# Patient Record
Sex: Male | Born: 1955
Health system: Southern US, Community
[De-identification: ages and names within clinical notes are randomized; demographics above are authoritative.]

## PROBLEM LIST (undated history)

## (undated) DIAGNOSIS — F319 Bipolar disorder, unspecified: Secondary | ICD-10-CM

## (undated) DIAGNOSIS — I10 Essential (primary) hypertension: Secondary | ICD-10-CM

## (undated) HISTORY — DX: Essential (primary) hypertension: I10

## (undated) HISTORY — PX: OTHER SURGICAL HISTORY: SHX169

## (undated) HISTORY — DX: Bipolar disorder, unspecified: F31.9

---

## 2010-12-24 LAB — BASIC METABOLIC PANEL
BUN: 14 mg/dL (ref 4–21)
Creatinine: 0.7 mg/dL (ref 0.6–1.3)
GLUCOSE: 132 mg/dL
Potassium: 4.6 mmol/L (ref 3.4–5.3)
Sodium: 128 mmol/L — AB (ref 137–147)

## 2010-12-24 LAB — HEMOGLOBIN A1C: Hgb A1c MFr Bld: 5.8 % (ref 4.0–6.0)

## 2015-02-05 ENCOUNTER — Other Ambulatory Visit
Admission: RE | Admit: 2015-02-05 | Discharge: 2015-02-05 | Disposition: A | Discharge status: Home / Self Care | Payer: No Typology Code available for payment source | Source: Ambulatory Visit | Attending: Psychiatry | Admitting: Psychiatry

## 2015-02-05 DIAGNOSIS — F319 Bipolar disorder, unspecified: Secondary | ICD-10-CM | POA: Diagnosis present

## 2015-02-05 LAB — LIPID PANEL
CHOL/HDL RATIO: 2.6 ratio
Cholesterol: 207 mg/dL — ABNORMAL HIGH (ref 0–200)
HDL: 79 mg/dL (ref >40)
LDL Cholesterol: 123 mg/dL — ABNORMAL HIGH (ref 0–99)
TRIGLYCERIDES: 27 mg/dL (ref <150)
VLDL: 5 mg/dL (ref 0–40)

## 2015-02-05 LAB — GLUCOSE, RANDOM: Glucose, Bld: 123 mg/dL — ABNORMAL HIGH (ref 65–99)

## 2015-03-26 ENCOUNTER — Telehealth: Payer: Self-pay | Admitting: Psychiatry

## 2015-03-26 DIAGNOSIS — F3181 Bipolar II disorder: Secondary | ICD-10-CM

## 2015-04-27 ENCOUNTER — Other Ambulatory Visit
Admission: RE | Admit: 2015-04-27 | Discharge: 2015-04-27 | Disposition: A | Discharge status: Home / Self Care | Payer: No Typology Code available for payment source | Source: Ambulatory Visit | Attending: Psychiatry | Admitting: Psychiatry

## 2015-04-27 DIAGNOSIS — F319 Bipolar disorder, unspecified: Secondary | ICD-10-CM | POA: Diagnosis not present

## 2015-04-27 LAB — CBC WITH DIFFERENTIAL/PLATELET
Basophils Absolute: 0.1 10*3/uL (ref 0–0.1)
Basophils Relative: 1 %
EOS ABS: 0.1 10*3/uL (ref 0–0.7)
Eosinophils Relative: 2 %
HCT: 41.5 % (ref 40.0–52.0)
HEMOGLOBIN: 14.5 g/dL (ref 13.0–18.0)
Lymphocytes Relative: 20 %
Lymphs Abs: 1.1 10*3/uL (ref 1.0–3.6)
MCH: 34.4 pg — AB (ref 26.0–34.0)
MCHC: 35.1 g/dL (ref 32.0–36.0)
MCV: 97.9 fL (ref 80.0–100.0)
MONO ABS: 0.5 10*3/uL (ref 0.2–1.0)
MONOS PCT: 9 %
NEUTROS ABS: 3.6 10*3/uL (ref 1.4–6.5)
Neutrophils Relative %: 68 %
PLATELETS: 231 10*3/uL (ref 150–440)
RBC: 4.23 MIL/uL — ABNORMAL LOW (ref 4.40–5.90)
RDW: 14 % (ref 11.5–14.5)
WBC: 5.4 10*3/uL (ref 3.8–10.6)

## 2015-04-27 LAB — CARBAMAZEPINE LEVEL, TOTAL: Carbamazepine Lvl: 11.5 ug/mL (ref 4.0–12.0)

## 2015-04-27 LAB — LIPID PANEL
Cholesterol: 229 mg/dL — ABNORMAL HIGH (ref 0–200)
HDL: 78 mg/dL (ref >40)
LDL Cholesterol: 145 mg/dL — ABNORMAL HIGH (ref 0–99)
Total CHOL/HDL Ratio: 2.9 RATIO
Triglycerides: 30 mg/dL (ref <150)
VLDL: 6 mg/dL (ref 0–40)

## 2015-04-27 LAB — GLUCOSE, RANDOM: Glucose, Bld: 111 mg/dL — ABNORMAL HIGH (ref 65–99)

## 2015-05-30 ENCOUNTER — Ambulatory Visit: Payer: Self-pay | Admitting: Psychiatry

## 2015-05-31 ENCOUNTER — Encounter: Payer: Self-pay | Admitting: Psychiatry

## 2015-05-31 ENCOUNTER — Ambulatory Visit (INDEPENDENT_AMBULATORY_CARE_PROVIDER_SITE_OTHER): Payer: No Typology Code available for payment source | Admitting: Psychiatry

## 2015-05-31 ENCOUNTER — Ambulatory Visit: Payer: Self-pay | Admitting: Psychiatry

## 2015-05-31 VITALS — BP 128/78 | HR 78 | Temp 97.4°F | Ht 67.0 in | Wt 165.8 lb

## 2015-05-31 DIAGNOSIS — F3170 Bipolar disorder, currently in remission, most recent episode unspecified: Secondary | ICD-10-CM

## 2015-05-31 MED ORDER — CARBAMAZEPINE 200 MG PO TABS: ORAL_TABLET | ORAL | Status: DC

## 2015-05-31 NOTE — Progress Notes (Signed)
BH MD/PA/NP OP Progress Note  05/31/2015 2:03 PM Eric Hatfield  MRN:  [id number]  Subjective:  Patient returns for follow-up of his bipolar disorder type II. He indicates things continue to be the same. He continues to work 4 days a week as they have Fridays off in [place]. He states that he comes home he is sleeping well and states that sometimes he'll fall asleep in his chair while he is reading the newspaper. He states his appetite is good. He denies any psychotic symptoms. He denies any mood changes, manic symptoms or depressive symptoms. He presents to the appointment with his wife as is usually the case and she does not report any issues.  I reviewed his labs with him. He did have a mildly elevated glucose at 111 and his cholesterol was slightly elevated to 27. However I encouraged him to discuss these with his primary care physician. His Tegretol level was 11.5. He denies any side effects or problems from this medication and has been stable on it for years and thus we are not going to change the dose today.  Chief Complaint: same Chief Complaint    Follow-up; Medication Refill; Stress     Visit Diagnosis:     ICD-9-CM ICD-10-CM   1. Bipolar disorder in full remission, most recent episode unspecified type 296.80 F31.70     Past Medical History:  Past Medical History  Diagnosis Date  . Anxiety   . Depression    History reviewed. No pertinent past surgical history. Family History:  Family History  Problem Relation Age of Onset  . Hypertension Mother   . Hyperlipidemia Father   . Hypertension Brother    Social History:  Social History   Social History  . Marital Status: Unknown    Spouse Name: N/A  . Number of Children: N/A  . Years of Education: N/A   Social History Main Topics  . Smoking status: Current Every Day Smoker    Types: Cigarettes    Start date: 05/31/1975  . Smokeless tobacco: Never Used  . Alcohol Use: No  . Drug Use: No  . Sexual Activity: Yes    Birth Control/ Protection: None   Other Topics Concern  . None   Social History Narrative  . None   Additional History:   Assessment:   Musculoskeletal: Strength & Muscle Tone: within normal limits Gait & Station: normal Patient leans: N/A  Psychiatric Specialty Exam: HPI  Review of Systems  Psychiatric/Behavioral: Negative for depression, suicidal ideas, hallucinations, memory loss and substance abuse. The patient is not nervous/anxious and does not have insomnia.     Blood pressure 128/78, pulse 78, temperature 97.4 F (36.3 C), temperature source Tympanic, height 5\' 7"  (1.702 m), weight 165 lb 12.8 oz (75.206 kg), SpO2 95 %.Body mass index is 25.96 kg/(m^2).  General Appearance: Well Groomed  Eye Contact:  Good  Speech:  Normal Rate  Volume:  Normal  Mood:  Good  Affect:  bright, euthymic  Thought Process:  Linear  Orientation:  Full (Time, Place, and Person)  Thought Content:  Negative  Suicidal Thoughts:  No  Homicidal Thoughts:  No  Memory:  Immediate;   Good Recent;   Good Remote;   Good  Judgement:  Good  Insight:  Good  Psychomotor Activity:  Negative  Concentration:  Good  Recall:  Good  Fund of Knowledge: Good  Language: Good  Akathisia:  Negative  Handed:  Right  AIMS (if indicated):    Assets:  Communication  Skills Desire for Improvement Social Support Vocational/Educational  ADL's:  Intact  Cognition: WNL  Sleep: good   Is the patient at risk to self?  No. Has the patient been a risk to self in the past 6 months?  No. Has the patient been a risk to self within the distant past?  No. Is the patient a risk to others?  No. Has the patient been a risk to others in the past 6 months?  No. Has the patient been a risk to others within the distant past?  No.  Current Medications: Current Outpatient Prescriptions  Medication Sig Dispense Refill  . carbamazepine (TEGRETOL) 200 MG tablet Take 2 tablets in the morning, 1 at noon and 3 at bedtime.  180 tablet 5   No current facility-administered medications for this visit.    Medical Decision Making:  Established Problem, Stable/Improving (1) and Review of Medication Regimen & Side Effects (2)  Treatment Plan Summary:Medication management and Plan We will continue the patient on his carbamazepine, 400 mg in the morning, 200 mg at noon and 600 mg at bedtime. He will follow-up in 6 months. At that point we will obtain additional laboratory studies and assess as to whether it is appropriate to decrease his dose. We have recently discontinued the Navane which she had been on. Both he and wife report no change since the discontinuation of the Navane.   Wallace Going 05/31/2015, 2:03 PM

## 2015-09-02 ENCOUNTER — Telehealth: Payer: Self-pay | Admitting: Family Medicine

## 2015-09-02 DIAGNOSIS — E739 Lactose intolerance, unspecified: Secondary | ICD-10-CM | POA: Insufficient documentation

## 2015-09-02 DIAGNOSIS — F17200 Nicotine dependence, unspecified, uncomplicated: Secondary | ICD-10-CM

## 2015-09-02 DIAGNOSIS — F319 Bipolar disorder, unspecified: Secondary | ICD-10-CM

## 2015-09-02 DIAGNOSIS — J309 Allergic rhinitis, unspecified: Secondary | ICD-10-CM | POA: Insufficient documentation

## 2015-09-02 DIAGNOSIS — F17210 Nicotine dependence, cigarettes, uncomplicated: Secondary | ICD-10-CM | POA: Insufficient documentation

## 2015-09-02 DIAGNOSIS — E871 Hypo-osmolality and hyponatremia: Secondary | ICD-10-CM | POA: Insufficient documentation

## 2015-09-02 NOTE — Telephone Encounter (Signed)
Pt has not been seen since 04/24/2013.  Pt states has is having sinus congestion and some cough.  Pt states he has not seen any other doctor.  Pt is requesting to reestablish with you.  [employer].  Rx-Carbamazbine 200mg .  CB#[phone]/MW

## 2015-09-02 NOTE — Telephone Encounter (Signed)
Marcelino DusterMichelle, can you schedule appt? Thanks!

## 2015-09-02 NOTE — Telephone Encounter (Signed)
Patients seen within the last 3 years are still active patients and do not need to be re-established. This past should be scheduled for o.v. Without delay. Thanks.

## 2015-09-02 NOTE — Telephone Encounter (Signed)
Please advise 

## 2015-09-02 NOTE — Telephone Encounter (Signed)
Pt scheduled for 09/03/2015@11 :15/MW

## 2015-09-03 ENCOUNTER — Encounter: Payer: Self-pay | Admitting: Family Medicine

## 2015-09-03 ENCOUNTER — Ambulatory Visit (INDEPENDENT_AMBULATORY_CARE_PROVIDER_SITE_OTHER): Payer: No Typology Code available for payment source | Admitting: Family Medicine

## 2015-09-03 VITALS — BP 124/70 | HR 76 | Temp 98.7°F | Resp 16 | Wt 167.0 lb

## 2015-09-03 DIAGNOSIS — J329 Chronic sinusitis, unspecified: Secondary | ICD-10-CM | POA: Insufficient documentation

## 2015-09-03 DIAGNOSIS — J322 Chronic ethmoidal sinusitis: Secondary | ICD-10-CM

## 2015-09-03 DIAGNOSIS — J309 Allergic rhinitis, unspecified: Secondary | ICD-10-CM | POA: Diagnosis not present

## 2015-09-03 MED ORDER — AMOXICILLIN 500 MG PO CAPS: 1000.0000 mg | ORAL_CAPSULE | Freq: Two times a day (BID) | ORAL | Status: AC

## 2015-09-03 MED ORDER — FLUTICASONE PROPIONATE 50 MCG/ACT NA SUSP: 2.0000 | Freq: Every day | NASAL | Status: DC

## 2015-09-03 NOTE — Progress Notes (Signed)
       Patient: Eric Hatfield Male    DOB: 05-04-1956   59 y.o.   MRN: [id number] Visit Date: 09/03/2015  Today's Provider: Mila Merryonald Mauricio Dahlen, MD   Chief Complaint  Patient presents with  . Cough  . Sinusitis   Subjective:         Sinusitis This is a new problem. The current episode started 1 to 4 weeks ago (x19 days). The problem is unchanged. There has been no fever. He is experiencing no pain. Associated symptoms include congestion, coughing and sinus pressure. Pertinent negatives include no chills, ear pain, headaches, hoarse voice, neck pain, shortness of breath, sneezing or sore throat. Past treatments include spray decongestants, oral decongestants and saline sprays. The treatment provided mild relief.   Patient has had sinus congestion for the last 19 days. Has had a mild cough, non-productive. No fever or sore throat. Has been using OTC saline and pseudoephredrine with minimal improvement.    No Known Allergies Previous Medications   CARBAMAZEPINE (TEGRETOL) 200 MG TABLET    Take 2 tablets in the morning, 1 at noon and 3 at bedtime.    Review of Systems  Constitutional: Negative for fever, chills and appetite change.  HENT: Positive for congestion, postnasal drip and sinus pressure. Negative for ear pain, hoarse voice, sneezing and sore throat.   Respiratory: Positive for cough. Negative for chest tightness, shortness of breath and wheezing.   Cardiovascular: Negative for chest pain and palpitations.  Gastrointestinal: Negative for nausea, vomiting and abdominal pain.  Musculoskeletal: Negative for neck pain.  Neurological: Negative for headaches.    Social History  Substance Use Topics  . Smoking status: Current Every Day Smoker -- 1.00 packs/day for 30 years    Types: Cigarettes    Start date: 05/31/1975  . Smokeless tobacco: Never Used  . Alcohol Use: No   Objective:   BP 124/70 mmHg  Pulse 76  Temp(Src) 98.7 F (37.1 C) (Oral)  Resp 16  Wt 167 lb  (75.751 kg)  SpO2 97%  Physical Exam  General Appearance:    Alert, cooperative, no distress  HENT:   ENT exam normal, no neck nodes or sinus tenderness, neck without nodes, throat normal without erythema or exudate, ethmoid sinus tender and nasal mucosa pale and congested  Eyes:    PERRL, conjunctiva/corneas clear, EOM's intact       Lungs:     Clear to auscultation bilaterally, respirations unlabored  Heart:    Regular rate and rhythm  Neurologic:   Awake, alert, oriented x 3. No apparent focal neurological           defect.           Assessment & Plan:     1. Ethmoid sinusitis, unspecified chronicity  - amoxicillin (AMOXIL) 500 MG capsule; Take 2 capsules (1,000 mg total) by mouth 2 (two) times daily.  Dispense: 28 capsule; Refill: 0  2. Allergic rhinitis, unspecified allergic rhinitis type  - fluticasone (FLONASE) 50 MCG/ACT nasal spray; Place 2 sprays into both nostrils daily.  Dispense: 16 g; Refill: 6  Patient Instructions  Use prescription nasal spray (fluticasone) before bed at night Continue to use saline nasal spray every 3-4 hours during the daytime.   Call if symptoms change or if not rapidly improving.            Mila Merryonald Gerardine Peltz, MD  [facility]

## 2015-09-03 NOTE — Patient Instructions (Signed)
Use prescription nasal spray (fluticasone) before bed at night Continue to use saline nasal spray every 3-4 hours during the daytime.

## 2015-09-04 ENCOUNTER — Encounter: Payer: Self-pay | Admitting: Family Medicine

## 2015-11-28 ENCOUNTER — Ambulatory Visit (INDEPENDENT_AMBULATORY_CARE_PROVIDER_SITE_OTHER): Payer: No Typology Code available for payment source | Admitting: Psychiatry

## 2015-11-28 ENCOUNTER — Encounter: Payer: Self-pay | Admitting: Psychiatry

## 2015-11-28 DIAGNOSIS — F319 Bipolar disorder, unspecified: Secondary | ICD-10-CM

## 2015-11-28 MED ORDER — CARBAMAZEPINE 200 MG PO TABS: ORAL_TABLET | ORAL | Status: DC

## 2015-11-28 NOTE — Progress Notes (Signed)
Patient ID: Eric Hatfield A Sinopoli, male   DOB: 1956/06/14, 60 y.o.   MRN: [id number] [facility]  11/28/2015 3:04 PM Eric BloodgoodRay A Uresti  MRN:  [id number]  Subjective:  Patient returns for follow-up of his bipolar disorder type II. Patient was previously seen by Dr. Mayford KnifeWilliams. This is the first visit for this patient with this physician. He reports doing well. Denies any problems with his mood. Fair sleep and appetite. His been taking his Tegretol as instructed. He was discontinued on the Navane at last visit and states he's done well.   He denies any psychotic symptoms. He denies any mood changes, manic symptoms or depressive symptoms. He presents to the appointment with his wife as is usually the case and she does not report any issues.    Chief Complaint    Follow-up; Medication Refill     Visit Diagnosis:   No diagnosis found.  Past Medical History:  History reviewed. No pertinent past medical history.  Past Surgical History  Procedure Laterality Date  . Impacted tooth     Family History:  Family History  Problem Relation Age of Onset  . Hypertension Mother   . Hyperlipidemia Father   . Hypertension Brother    Social History:  Social History   Social History  . Marital Status: Married    Spouse Name: N/A  . Number of Children: N/A  . Years of Education: N/A   Social History Main Topics  . Smoking status: Current Every Day Smoker -- 1.00 packs/day for 30 years    Types: Cigarettes    Start date: 05/31/1975  . Smokeless tobacco: Never Used  . Alcohol Use: No  . Drug Use: No  . Sexual Activity: Yes    Birth Control/ Protection: None   Other Topics Concern  . None   Social History Narrative   Additional History:   Assessment:   Musculoskeletal: Strength & Muscle Tone: within normal limits Gait & Station: normal Patient leans: N/A  Psychiatric Specialty Exam: HPI  Review of Systems  Psychiatric/Behavioral: Negative for depression, suicidal ideas,  hallucinations, memory loss and substance abuse. The patient is not nervous/anxious and does not have insomnia.     Blood pressure 142/88, pulse 82, temperature 97.7 F (36.5 C), temperature source Tympanic, height 5\' 7"  (1.702 m), weight 163 lb 12.8 oz (74.299 kg), SpO2 95 %.Body mass index is 25.65 kg/(m^2).  General Appearance: Well Groomed  Eye Contact:  Good  Speech:  Normal Rate  Volume:  Normal  Mood:  Good  Affect:  bright, euthymic  Thought Process:  Linear  Orientation:  Full (Time, Place, and Person)  Thought Content:  Negative  Suicidal Thoughts:  No  Homicidal Thoughts:  No  Memory:  Immediate;   Good Recent;   Good Remote;   Good  Judgement:  Good  Insight:  Good  Psychomotor Activity:  Negative  Concentration:  Good  Recall:  Good  Fund of Knowledge: Good  Language: Good  Akathisia:  Negative  Handed:  Right  AIMS (if indicated):    Assets:  Communication Skills Desire for Improvement Social Support Vocational/Educational  ADL's:  Intact  Cognition: WNL  Sleep: good   Is the patient at risk to self?  No. Has the patient been a risk to self in the past 6 months?  No. Has the patient been a risk to self within the distant past?  No. Is the patient a risk to others?  No. Has the patient been a  risk to others in the past 6 months?  No. Has the patient been a risk to others within the distant past?  No.  Current Medications: Current Outpatient Prescriptions  Medication Sig Dispense Refill  . carbamazepine (TEGRETOL) 200 MG tablet Take 2 tablets in the morning, 1 at noon and 3 at bedtime. 180 tablet 5  . fluticasone (FLONASE) 50 MCG/ACT nasal spray Place 2 sprays into both nostrils daily. 16 g 6   No current facility-administered medications for this visit.    Medical Decision Making:  Established Problem, Stable/Improving (1) and Review of Medication Regimen & Side Effects (2)  Treatment Plan Summary:Medication management and Plan We will continue the  patient on his carbamazepine, 400 mg in the morning, 200 mg at noon and 600 mg at bedtime.  Obtain Tegretol level  And Liver function tests prior to next visit. RTC in 3 months or call before if necessary.  Teegan Brandis 11/28/2015, 3:04 PM

## 2015-11-29 ENCOUNTER — Ambulatory Visit: Payer: No Typology Code available for payment source | Admitting: Psychiatry

## 2016-02-15 ENCOUNTER — Other Ambulatory Visit
Admission: RE | Admit: 2016-02-15 | Discharge: 2016-02-15 | Disposition: A | Discharge status: Home / Self Care | Payer: No Typology Code available for payment source | Source: Ambulatory Visit | Attending: Psychiatry | Admitting: Psychiatry

## 2016-02-15 DIAGNOSIS — R945 Abnormal results of liver function studies: Secondary | ICD-10-CM | POA: Diagnosis present

## 2016-02-15 LAB — HEPATIC FUNCTION PANEL
ALBUMIN: 4.4 g/dL (ref 3.5–5.0)
ALT: 27 U/L (ref 17–63)
AST: 26 U/L (ref 15–41)
Alkaline Phosphatase: 106 U/L (ref 38–126)
BILIRUBIN DIRECT: 0.1 mg/dL (ref 0.1–0.5)
Indirect Bilirubin: 0.7 mg/dL (ref 0.3–0.9)
Total Bilirubin: 0.8 mg/dL (ref 0.3–1.2)
Total Protein: 7.1 g/dL (ref 6.5–8.1)

## 2016-02-15 LAB — CARBAMAZEPINE LEVEL, TOTAL: Carbamazepine Lvl: 8.8 ug/mL (ref 4.0–12.0)

## 2016-02-27 ENCOUNTER — Ambulatory Visit (INDEPENDENT_AMBULATORY_CARE_PROVIDER_SITE_OTHER): Payer: Managed Care, Other (non HMO) | Admitting: Psychiatry

## 2016-02-27 ENCOUNTER — Encounter: Payer: Self-pay | Admitting: Psychiatry

## 2016-02-27 VITALS — BP 148/76 | HR 72 | Temp 97.5°F | Ht 69.0 in | Wt 163.2 lb

## 2016-02-27 DIAGNOSIS — F3170 Bipolar disorder, currently in remission, most recent episode unspecified: Secondary | ICD-10-CM | POA: Diagnosis not present

## 2016-02-27 MED ORDER — CARBAMAZEPINE 200 MG PO TABS: ORAL_TABLET | ORAL | Status: DC

## 2016-02-27 NOTE — Progress Notes (Signed)
Patient ID: Eric Hatfield, male   DOB: 1956/08/02, 60 y.o.   MRN: [id number]  [facility] MD/PA/NP OP Progress Note  02/27/2016 3:05 PM Eric Hatfield  MRN:  [id number]  Subjective:  Patient returns for follow-up of his bipolar disorder type II.Marland Kitchen He reports doing well. Denies any problems with his mood. Fair sleep and appetite. His been taking his Tegretol as instructed.   He denies any psychotic symptoms. He denies any mood changes, manic symptoms or depressive symptoms. Patient reports enjoying time with his family and his life. States that he works most days and enjoys his work. He denies any suicidal thoughts. Patients liver function tests are normal, Carbamazepine level at 8.8  Visit Diagnosis:     ICD-9-CM ICD-10-CM   1. Bipolar disorder in full remission, most recent episode unspecified type (HCC) 296.80 F31.70     Past Medical History:  No past medical history on file.  Past Surgical History  Procedure Laterality Date  . Impacted tooth     Family History:  Family History  Problem Relation Age of Onset  . Hypertension Mother   . Hyperlipidemia Father   . Hypertension Brother    Social History:  Social History   Social History  . Marital Status: Married    Spouse Name: N/A  . Number of Children: N/A  . Years of Education: N/A   Social History Main Topics  . Smoking status: Current Every Day Smoker -- 1.00 packs/day for 30 years    Types: Cigarettes    Start date: 05/31/1975  . Smokeless tobacco: Never Used  . Alcohol Use: No  . Drug Use: No  . Sexual Activity: Yes    Birth Control/ Protection: None   Other Topics Concern  . None   Social History Narrative   Additional History:   Assessment:   Musculoskeletal: Strength & Muscle Tone: within normal limits Gait & Station: normal Patient leans: N/A  Psychiatric Specialty Exam: HPI  Review of Systems  Psychiatric/Behavioral: Negative for depression, suicidal ideas, hallucinations, memory loss and substance  abuse. The patient is not nervous/anxious and does not have insomnia.     Blood pressure 148/76, pulse 72, temperature 97.5 F (36.4 C), temperature source Tympanic, height  (1.753 m), weight 163 lb 3.2 oz (74.027 kg), SpO2 94 %.Body mass index is 24.09 kg/(m^2).  General Appearance: Well Groomed  Eye Contact:  Good  Speech:  Normal Rate  Volume:  Normal  Mood:  Good  Affect:  bright, euthymic  Thought Process:  Linear  Orientation:  Full (Time, Place, and Person)  Thought Content:  Negative  Suicidal Thoughts:  No  Homicidal Thoughts:  No  Memory:  Immediate;   Good Recent;   Good Remote;   Good  Judgement:  Good  Insight:  Good  Psychomotor Activity:  Negative  Concentration:  Good  Recall:  Good  Fund of Knowledge: Good  Language: Good  Akathisia:  Negative  Handed:  Right  AIMS (if indicated):    Assets:  Communication Skills Desire for Improvement Social Support Vocational/Educational  ADL's:  Intact  Cognition: WNL  Sleep: good   Is the patient at risk to self?  No. Has the patient been a risk to self in the past 6 months?  No. Has the patient been a risk to self within the distant past?  No. Is the patient a risk to others?  No. Has the patient been a risk to others in the past 6 months?  No. Has  the patient been a risk to others within the distant past?  No.  Current Medications: Current Outpatient Prescriptions  Medication Sig Dispense Refill  . carbamazepine (TEGRETOL) 200 MG tablet Take 2 tablets in the morning, 1 at noon and 3 at bedtime. 180 tablet 5  . fluticasone (FLONASE) 50 MCG/ACT nasal spray Place 2 sprays into both nostrils daily. 16 g 6   No current facility-administered medications for this visit.    Medical Decision Making:  Established Problem, Stable/Improving (1) and Review of Medication Regimen & Side Effects (2)  Treatment Plan Summary:Medication management and Plan We will continue the patient on his carbamazepine, 400 mg in the  morning, 200 mg at noon and 600 mg at bedtime.  Labs reviewed - LFT`s normal, tegretol level at 8.8 RTC in 3 months or call before if necessary.  [name] 02/27/2016, 3:05 PM

## 2016-06-25 ENCOUNTER — Ambulatory Visit (INDEPENDENT_AMBULATORY_CARE_PROVIDER_SITE_OTHER): Payer: Managed Care, Other (non HMO) | Admitting: Psychiatry

## 2016-06-25 ENCOUNTER — Encounter: Payer: Self-pay | Admitting: Psychiatry

## 2016-06-25 VITALS — BP 173/91 | HR 80 | Temp 97.9°F | Wt 164.4 lb

## 2016-06-25 DIAGNOSIS — F3170 Bipolar disorder, currently in remission, most recent episode unspecified: Secondary | ICD-10-CM

## 2016-06-25 MED ORDER — CARBAMAZEPINE 200 MG PO TABS: ORAL_TABLET | ORAL | 3 refills | Status: DC

## 2016-06-25 NOTE — Progress Notes (Signed)
Patient ID: Eric Hatfield A Berdan, male   DOB: 21-Sep-1956, 60 y.o.   MRN: [id number]  [facility]  06/25/2016 2:50 PM Eric BloodgoodRay A Derrick  MRN:  [id number]  Subjective:  Patient returns for follow-up of his bipolar disorder type II.Marland Kitchen. He reports doing well. Denies any problems with his mood. Fair sleep and appetite. His been taking his Tegretol as instructed.   He denies any psychotic symptoms. He denies any mood changes, manic symptoms or depressive symptoms. Patient reports enjoying time with his family and his life. States that he works most days and enjoys his work. He denies any suicidal thoughts.  Chief Complaint    Follow-up; Medication Refill     Visit Diagnosis:     ICD-9-CM ICD-10-CM   1. Bipolar disorder in full remission, most recent episode unspecified type (HCC) 296.80 F31.70     Past Medical History:  History reviewed. No pertinent past medical history.  Past Surgical History:  Procedure Laterality Date  . Impacted tooth     Family History:  Family History  Problem Relation Age of Onset  . Hypertension Mother   . Hyperlipidemia Father   . Hypertension Brother    Social History:  Social History   Social History  . Marital status: Married    Spouse name: N/A  . Number of children: N/A  . Years of education: N/A   Social History Main Topics  . Smoking status: Current Every Day Smoker    Packs/day: 1.00    Years: 30.00    Types: Cigarettes    Start date: 05/31/1975  . Smokeless tobacco: Never Used  . Alcohol use No  . Drug use: No  . Sexual activity: Yes    Birth control/ protection: None   Other Topics Concern  . None   Social History Narrative  . None   Additional History:   Assessment:   Musculoskeletal: Strength & Muscle Tone: within normal limits Gait & Station: normal Patient leans: N/A  Psychiatric Specialty Exam: HPI  Review of Systems  Psychiatric/Behavioral: Negative for depression, hallucinations, memory loss, substance  abuse and suicidal ideas. The patient is not nervous/anxious and does not have insomnia.     Blood pressure (!) 173/91, pulse 80, temperature 97.9 F (36.6 C), temperature source Oral, weight 164 lb 6.4 oz (74.6 kg).Body mass index is 24.28 kg/m.  General Appearance: Well Groomed  Eye Contact:  Good  Speech:  Normal Rate  Volume:  Normal  Mood:  Good  Affect:  bright, euthymic  Thought Process:  Linear  Orientation:  Full (Time, Place, and Person)  Thought Content:  Negative  Suicidal Thoughts:  No  Homicidal Thoughts:  No  Memory:  Immediate;   Good Recent;   Good Remote;   Good  Judgement:  Good  Insight:  Good  Psychomotor Activity:  Negative  Concentration:  Good  Recall:  Good  Fund of Knowledge: Good  Language: Good  Akathisia:  Negative  Handed:  Right  AIMS (if indicated):      Assets:  Communication Skills Desire for Improvement Social Support Vocational/Educational  ADL's:  Intact  Cognition: WNL  Sleep: good   Is the patient at risk to self?  No. Has the patient been a risk to self in the past 6 months?  No. Has the patient been a risk to self within the distant past?  No. Is the patient a risk to others?  No. Has the patient been a risk to others in the past  6 months?  No. Has the patient been a risk to others within the distant past?  No.  Current Medications: Current Outpatient Prescriptions  Medication Sig Dispense Refill  . carbamazepine (TEGRETOL) 200 MG tablet Take 2 tablets in the morning, 1 at noon and 3 at bedtime. 180 tablet 3  . fluticasone (FLONASE) 50 MCG/ACT nasal spray Place 2 sprays into both nostrils daily. (Patient not taking: Reported on 06/25/2016) 16 g 6   No current facility-administered medications for this visit.     Medical Decision Making:  Established Problem, Stable/Improving (1) and Review of Medication Regimen & Side Effects (2)  Treatment Plan Summary:Medication management and Plan We will continue the patient on his  carbamazepine, 400 mg in the morning, 200 mg at noon and 600 mg at bedtime.   RTC in 3 months or call before if necessary.  Fancy Dunkley 06/25/2016, 2:50 PM

## 2016-11-26 ENCOUNTER — Encounter: Payer: Self-pay | Admitting: Psychiatry

## 2016-11-26 ENCOUNTER — Ambulatory Visit (INDEPENDENT_AMBULATORY_CARE_PROVIDER_SITE_OTHER): Payer: Managed Care, Other (non HMO) | Admitting: Psychiatry

## 2016-11-26 VITALS — BP 159/96 | HR 76 | Temp 97.6°F | Wt 166.0 lb

## 2016-11-26 DIAGNOSIS — F3170 Bipolar disorder, currently in remission, most recent episode unspecified: Secondary | ICD-10-CM

## 2016-11-26 MED ORDER — CARBAMAZEPINE 200 MG PO TABS: ORAL_TABLET | ORAL | 3 refills | Status: DC

## 2016-11-26 NOTE — Progress Notes (Signed)
Patient ID: Eric Hatfield, male   DOB: Feb 11, 1956, 61 y.o.   MRN: [id number]  [facility] OP Progress Note  11/26/2016 3:06 PM Eric Hatfield  MRN:  [id number]  Subjective:  Patient returns for follow-up of his bipolar disorder type II.Marland Kitchen. He reports that he has been doing quite well. States that he has been working outside and doing well overall. Compliant with his Tegretol like he should be taking it. Blood pressure is elevated today at 169/95. States that he has a little bit of coffee and was running around a bit. His mother and brother have history of hypertension. Sleeping well and eating well. Denies any other issues with his mood.  Chief Complaint    Follow-up; Medication Refill     Visit Diagnosis:     ICD-9-CM ICD-10-CM   1. Bipolar disorder in full remission, most recent episode unspecified type (HCC) 296.80 F31.70     Past Medical History:  History reviewed. No pertinent past medical history.  Past Surgical History:  Procedure Laterality Date  . Impacted tooth     Family History:  Family History  Problem Relation Age of Onset  . Hypertension Mother   . Hyperlipidemia Father   . Hypertension Brother    Social History:  Social History   Social History  . Marital status: Married    Spouse name: N/A  . Number of children: N/A  . Years of education: N/A   Social History Main Topics  . Smoking status: Current Every Day Smoker    Packs/day: 1.00    Years: 30.00    Types: Cigarettes    Start date: 05/31/1975  . Smokeless tobacco: Never Used  . Alcohol use No  . Drug use: No  . Sexual activity: Yes    Birth control/ protection: None   Other Topics Concern  . None   Social History Narrative  . None   Additional History:   Assessment:   Musculoskeletal: Strength & Muscle Tone: within normal limits Gait & Station: normal Patient leans: N/A  Psychiatric Specialty Exam: HPI  Review of Systems  Psychiatric/Behavioral: Negative for depression,  hallucinations, memory loss, substance abuse and suicidal ideas. The patient is not nervous/anxious and does not have insomnia.     Blood pressure (!) 169/95, pulse 76, temperature 97.6 F (36.4 C), temperature source Oral, weight 166 lb (75.3 kg).Body mass index is 24.51 kg/m.  General Appearance: Well Groomed  Eye Contact:  Good  Speech:  Normal Rate  Volume:  Normal  Mood:  Good  Affect:  bright, euthymic  Thought Process:  Linear  Orientation:  Full (Time, Place, and Person)  Thought Content:  Negative  Suicidal Thoughts:  No  Homicidal Thoughts:  No  Memory:  Immediate;   Good Recent;   Good Remote;   Good  Judgement:  Good  Insight:  Good  Psychomotor Activity:  Negative  Concentration:  Good  Recall:  Good  Fund of Knowledge: Good  Language: Good  Akathisia:  Negative  Handed:  Right  AIMS (if indicated):      Assets:  Communication Skills Desire for Improvement Social Support Vocational/Educational  ADL's:  Intact  Cognition: WNL  Sleep: good   Is the patient at risk to self?  No. Has the patient been a risk to self in the past 6 months?  No. Has the patient been a risk to self within the distant past?  No. Is the patient a risk to others?  No. Has the patient been  a risk to others in the past 6 months?  No. Has the patient been a risk to others within the distant past?  No.  Current Medications: Current Outpatient Prescriptions  Medication Sig Dispense Refill  . carbamazepine (TEGRETOL) 200 MG tablet Take 2 tablets in the morning, 1 at noon and 3 at bedtime. 180 tablet 3  . fluticasone (FLONASE) 50 MCG/ACT nasal spray Place 2 sprays into both nostrils daily. 16 g 6   No current facility-administered medications for this visit.     Medical Decision Making:  Established Problem, Stable/Improving (1) and Review of Medication Regimen & Side Effects (2)  Treatment Plan Summary:Medication management and Plan We will continue the patient on his  carbamazepine, 400 mg in the morning, 200 mg at noon and 600 mg at bedtime.  Obtain Tegretol level prior to next visit.  Hypertension-patient recommended to see his primary care physician and manage his high blood pressure since he has a family history.  RTC in 3 months or call before if necessary.  Nayelly Laughman 11/26/2016, 3:06 PM

## 2016-12-11 ENCOUNTER — Encounter: Payer: Self-pay | Admitting: Family Medicine

## 2016-12-11 ENCOUNTER — Ambulatory Visit (INDEPENDENT_AMBULATORY_CARE_PROVIDER_SITE_OTHER): Payer: Managed Care, Other (non HMO) | Admitting: Family Medicine

## 2016-12-11 VITALS — BP 154/90 | HR 91 | Temp 97.9°F | Resp 16 | Wt 163.0 lb

## 2016-12-11 DIAGNOSIS — I10 Essential (primary) hypertension: Secondary | ICD-10-CM | POA: Insufficient documentation

## 2016-12-11 DIAGNOSIS — F17210 Nicotine dependence, cigarettes, uncomplicated: Secondary | ICD-10-CM | POA: Diagnosis not present

## 2016-12-11 DIAGNOSIS — R030 Elevated blood-pressure reading, without diagnosis of hypertension: Secondary | ICD-10-CM

## 2016-12-11 NOTE — Progress Notes (Signed)
Patient: Eric Hatfield Male    DOB: 09-09-1956   61 y.o.   MRN: [id number] Visit Date: 12/11/2016  Today's Provider: Mila Merry, MD   Chief Complaint  Patient presents with  . Blood Pressure Check   Subjective:    HPI  Elevated blood pressure:  BP Readings from Last 3 Encounters:  09/03/15 124/70  04/24/13 132/80    Patient comes in today reporting that his blood pressure has been elevated lately. His blood pressure was taken while he was seeing his Psychiatrist and it was elevated at 169/95. Patient was advised to follow up with his PCP.  He is not exercising. He is not adherent to low salt diet.   He is experiencing none.  Patient denies chest pain, chest pressure/discomfort, claudication, dyspnea, exertional chest pressure/discomfort, fatigue, irregular heart beat, lower extremity edema, near-syncope, orthopnea, palpitations, paroxysmal nocturnal dyspnea, syncope and tachypnea.   Cardiovascular risk factors include none.  Use of agents associated with hypertension: 4-5 cups of caffeinated coffee and several glasses of unsweet tea daily.     Weight trend: stable Wt Readings from Last 3 Encounters:  09/03/15 167 lb (75.8 kg)  04/24/13 166 lb (75.3 kg)    Current diet: well balanced  ------------------------------------------------------------------------     No Known Allergies   Current Outpatient Prescriptions:  .  carbamazepine (TEGRETOL) 200 MG tablet, Take 2 tablets in the morning, 1 at noon and 3 at bedtime., Disp: 180 tablet, Rfl: 3 .  fluticasone (FLONASE) 50 MCG/ACT nasal spray, Place 2 sprays into both nostrils daily. (Patient not taking: Reported on 12/11/2016), Disp: 16 g, Rfl: 6  Review of Systems  Constitutional: Negative for appetite change, chills and fever.  Respiratory: Negative for chest tightness, shortness of breath and wheezing.   Cardiovascular: Negative for chest pain and palpitations.  Gastrointestinal: Negative for abdominal  pain, nausea and vomiting.    Social History  Substance Use Topics  . Smoking status: Current Every Day Smoker    Packs/day: 1.00    Years: 30.00    Types: Cigarettes    Start date: 05/31/1975  . Smokeless tobacco: Never Used  . Alcohol use No   Objective:   BP (!) 156/90 (BP Location: Right Arm, Patient Position: Sitting, Cuff Size: Normal)   Pulse 91   Temp 97.9 F (36.6 C) (Oral)   Resp 16   Wt 163 lb (73.9 kg)   SpO2 97% Comment: room air  BMI 24.07 kg/m  Vitals:   12/11/16 0808 12/11/16 0823  BP: (!) 156/90 (!) 154/90  Pulse: 91   Resp: 16   Temp: 97.9 F (36.6 C)   TempSrc: Oral   SpO2: 97%   Weight: 163 lb (73.9 kg)      Physical Exam   General Appearance:    Alert, cooperative, no distress  Eyes:    PERRL, conjunctiva/corneas clear, EOM's intact       Lungs:     Clear to auscultation bilaterally, respirations unlabored  Heart:    Regular rate and rhythm  Neurologic:   Awake, alert, oriented x 3. No apparent focal neurological           defect.           Assessment & Plan:     1. Blood pressure elevated without history of HTN He admits to drinking excessive amounts of caffeinated beverages daily. His is to limit this to 2 cups of coffee and 1 cup of unsweetened tea. Strongly encouraged  to work on quitting smoking, he is going to start by reducing to 1/2 ppd.  - Lipid panel - TSH - Renal function panel  If labs normal will return in a month for BP check. Start medication if not better at follow up.   2. Smoking greater than 30 pack years Work on stopping smoking.      The entirety of the information documented in the History of Present Illness, Review of Systems and Physical Exam were personally obtained by me. Portions of this information were initially documented by Awilda Billoshena Chambers, CMA and reviewed by me for thoroughness and accuracy.    Mila Merryonald Fisher, MD  [facility]

## 2016-12-11 NOTE — Patient Instructions (Addendum)
Limit caffeine intake to no more than 2 cups of coffee and one cup of tea a day   Do NOT take any medications that have a decongestant. These medications usually have a 'D' at the end of their name, such as Claritin D or Mucinex D.     Hypertension Hypertension, commonly called high blood pressure, is when the force of blood pumping through the arteries is too strong. The arteries are the blood vessels that carry blood from the heart throughout the body. Hypertension forces the heart to work harder to pump blood and may cause arteries to become narrow or stiff. Having untreated or uncontrolled hypertension can cause heart attacks, strokes, kidney disease, and other problems. A blood pressure reading consists of a higher number over a lower number. Ideally, your blood pressure should be below 120/80. The first ("top") number is called the systolic pressure. It is a measure of the pressure in your arteries as your heart beats. The second ("bottom") number is called the diastolic pressure. It is a measure of the pressure in your arteries as the heart relaxes. What are the causes? The cause of this condition is not known. What increases the risk? Some risk factors for high blood pressure are under your control. Others are not. Factors you can change   Smoking.  Having type 2 diabetes mellitus, high cholesterol, or both.  Not getting enough exercise or physical activity.  Being overweight.  Having too much fat, sugar, calories, or salt (sodium) in your diet.  Drinking too much alcohol. Factors that are difficult or impossible to change   Having chronic kidney disease.  Having a family history of high blood pressure.  Age. Risk increases with age.  Race. You may be at higher risk if you are African-American.  Gender. Men are at higher risk than women before age 60. After age 60, women are at higher risk than men.  Having obstructive sleep apnea.  Stress. What are the signs or  symptoms? Extremely high blood pressure (hypertensive crisis) may cause:  Headache.  Anxiety.  Shortness of breath.  Nosebleed.  Nausea and vomiting.  Severe chest pain.  Jerky movements you cannot control (seizures). How is this diagnosed? This condition is diagnosed by measuring your blood pressure while you are seated, with your arm resting on a surface. The cuff of the blood pressure monitor will be placed directly against the skin of your upper arm at the level of your heart. It should be measured at least twice using the same arm. Certain conditions can cause a difference in blood pressure between your right and left arms. Certain factors can cause blood pressure readings to be lower or higher than normal (elevated) for a short period of time:  When your blood pressure is higher when you are in a health care provider's office than when you are at home, this is called white coat hypertension. Most people with this condition do not need medicines.  When your blood pressure is higher at home than when you are in a health care provider's office, this is called masked hypertension. Most people with this condition may need medicines to control blood pressure. If you have a high blood pressure reading during one visit or you have normal blood pressure with other risk factors:  You may be asked to return on a different day to have your blood pressure checked again.  You may be asked to monitor your blood pressure at home for 1 week or longer. If  you are diagnosed with hypertension, you may have other blood or imaging tests to help your health care provider understand your overall risk for other conditions. How is this treated? This condition is treated by making healthy lifestyle changes, such as eating healthy foods, exercising more, and reducing your alcohol intake. Your health care provider may prescribe medicine if lifestyle changes are not enough to get your blood pressure under  control, and if:  Your systolic blood pressure is above 130.  Your diastolic blood pressure is above 80. Your personal target blood pressure may vary depending on your medical conditions, your age, and other factors. Follow these instructions at home: Eating and drinking   Eat a diet that is high in fiber and potassium, and low in sodium, added sugar, and fat. An example eating plan is called the DASH (Dietary Approaches to Stop Hypertension) diet. To eat this way:  Eat plenty of fresh fruits and vegetables. Try to fill half of your plate at each meal with fruits and vegetables.  Eat whole grains, such as whole wheat pasta, brown rice, or whole grain bread. Fill about one quarter of your plate with whole grains.  Eat or drink low-fat dairy products, such as skim milk or low-fat yogurt.  Avoid fatty cuts of meat, processed or cured meats, and poultry with skin. Fill about one quarter of your plate with lean proteins, such as fish, chicken without skin, beans, eggs, and tofu.  Avoid premade and processed foods. These tend to be higher in sodium, added sugar, and fat.  Reduce your daily sodium intake. Most people with hypertension should eat less than 1,500 mg of sodium a day.  Limit alcohol intake to no more than 1 drink a day for nonpregnant women and 2 drinks a day for men. One drink equals 12 oz of beer, 5 oz of wine, or 1 oz of hard liquor. Lifestyle   Work with your health care provider to maintain a healthy body weight or to lose weight. Ask what an ideal weight is for you.  Get at least 30 minutes of exercise that causes your heart to beat faster (aerobic exercise) most days of the week. Activities may include walking, swimming, or biking.  Include exercise to strengthen your muscles (resistance exercise), such as pilates or lifting weights, as part of your weekly exercise routine. Try to do these types of exercises for 30 minutes at least 3 days a week.  Do not use any products  that contain nicotine or tobacco, such as cigarettes and e-cigarettes. If you need help quitting, ask your health care provider.  Monitor your blood pressure at home as told by your health care provider.  Keep all follow-up visits as told by your health care provider. This is important. Medicines   Take over-the-counter and prescription medicines only as told by your health care provider. Follow directions carefully. Blood pressure medicines must be taken as prescribed.  Do not skip doses of blood pressure medicine. Doing this puts you at risk for problems and can make the medicine less effective.  Ask your health care provider about side effects or reactions to medicines that you should watch for. Contact a health care provider if:  You think you are having a reaction to a medicine you are taking.  You have headaches that keep coming back (recurring).  You feel dizzy.  You have swelling in your ankles.  You have trouble with your vision. Get help right away if:  You develop a severe  headache or confusion.  You have unusual weakness or numbness.  You feel faint.  You have severe pain in your chest or abdomen.  You vomit repeatedly.  You have trouble breathing. Summary  Hypertension is when the force of blood pumping through your arteries is too strong. If this condition is not controlled, it may put you at risk for serious complications.  Your personal target blood pressure may vary depending on your medical conditions, your age, and other factors. For most people, a normal blood pressure is less than 120/80.  Hypertension is treated with lifestyle changes, medicines, or a combination of both. Lifestyle changes include weight loss, eating a healthy, low-sodium diet, exercising more, and limiting alcohol. This information is not intended to replace advice given to you by your health care provider. Make sure you discuss any questions you have with your health care  provider. Document Released: 09/07/2005 Document Revised: 08/05/2016 Document Reviewed: 08/05/2016 Elsevier Interactive Patient Education  2017 [employer].

## 2016-12-12 LAB — LIPID PANEL
CHOL/HDL RATIO: 2.2 ratio (ref 0.0–5.0)
Cholesterol, Total: 201 mg/dL — ABNORMAL HIGH (ref 100–199)
HDL: 91 mg/dL (ref >39)
LDL Calculated: 102 mg/dL — ABNORMAL HIGH (ref 0–99)
Triglycerides: 40 mg/dL (ref 0–149)
VLDL Cholesterol Cal: 8 mg/dL (ref 5–40)

## 2016-12-12 LAB — RENAL FUNCTION PANEL
Albumin: 4.5 g/dL (ref 3.5–5.5)
BUN / CREAT RATIO: 16 (ref 9–20)
BUN: 11 mg/dL (ref 6–24)
CHLORIDE: 88 mmol/L — AB (ref 96–106)
CO2: 24 mmol/L (ref 18–29)
Calcium: 9.1 mg/dL (ref 8.7–10.2)
Creatinine, Ser: 0.67 mg/dL — ABNORMAL LOW (ref 0.76–1.27)
GFR calc Af Amer: 122 mL/min/{1.73_m2} (ref >59)
GFR calc non Af Amer: 105 mL/min/{1.73_m2} (ref >59)
GLUCOSE: 117 mg/dL — AB (ref 65–99)
POTASSIUM: 4.8 mmol/L (ref 3.5–5.2)
Phosphorus: 3.8 mg/dL (ref 2.5–4.5)
SODIUM: 125 mmol/L — AB (ref 134–144)

## 2016-12-12 LAB — TSH: TSH: 1.16 u[IU]/mL (ref 0.450–4.500)

## 2016-12-16 ENCOUNTER — Other Ambulatory Visit: Payer: Self-pay | Admitting: *Deleted

## 2016-12-16 DIAGNOSIS — E871 Hypo-osmolality and hyponatremia: Secondary | ICD-10-CM

## 2016-12-16 NOTE — Progress Notes (Unsigned)
Labs ordered and printed. At front desk.

## 2017-01-08 ENCOUNTER — Encounter: Payer: Self-pay | Admitting: Family Medicine

## 2017-01-08 ENCOUNTER — Ambulatory Visit (INDEPENDENT_AMBULATORY_CARE_PROVIDER_SITE_OTHER): Payer: Managed Care, Other (non HMO) | Admitting: Family Medicine

## 2017-01-08 VITALS — BP 150/84 | HR 66 | Temp 97.9°F | Resp 16 | Wt 164.0 lb

## 2017-01-08 DIAGNOSIS — E871 Hypo-osmolality and hyponatremia: Secondary | ICD-10-CM

## 2017-01-08 DIAGNOSIS — F17210 Nicotine dependence, cigarettes, uncomplicated: Secondary | ICD-10-CM

## 2017-01-08 DIAGNOSIS — I10 Essential (primary) hypertension: Secondary | ICD-10-CM

## 2017-01-08 MED ORDER — BUPROPION HCL ER (SR) 150 MG PO TB12: ORAL_TABLET | ORAL | 5 refills | Status: DC

## 2017-01-08 MED ORDER — AMLODIPINE BESYLATE 2.5 MG PO TABS
2.5000 mg | ORAL_TABLET | Freq: Every day | ORAL | 3 refills | Status: DC
Start: 2017-01-08 — End: 2017-03-03

## 2017-01-08 NOTE — Progress Notes (Signed)
       Patient: Eric Hatfield Male    DOB: Jan 22, 1956   61 y.o.   MRN: [id number] Visit Date: 01/08/2017  Today's Provider: Mila Merry, MD   Chief Complaint  Patient presents with  . Follow-up  . Hypertension   Subjective:    HPI  Blood pressure elevated without history of HTN From 12/11/2016- counseled to limit caffeine to 2 cups of coffee and 1 cup of unsweetened tea. Strongly encouraged to work on quitting smoking, He was noted by chronically hyponatremic. Follow up labs were ordered but not yet done. He is on long term carbamazepine for BPAD. He admits to drinking 5-6 bottles of water and 3-5 cups of caffeinated coffee a day, in addition to 1-2 glasses of tea. He does avoid salt in diet. He continues to smoke at least 1 ppd.   BP Readings from Last 3 Encounters:  01/08/17 (!) 150/84  12/11/16 (!) 154/90  09/03/15 124/70    No Known Allergies   Current Outpatient Prescriptions:  .  carbamazepine (TEGRETOL) 200 MG tablet, Take 2 tablets in the morning, 1 at noon and 3 at bedtime., Disp: 180 tablet, Rfl: 3  Review of Systems  Constitutional: Negative for appetite change, chills and fever.  Respiratory: Negative for chest tightness, shortness of breath and wheezing.   Cardiovascular: Negative for chest pain and palpitations.  Gastrointestinal: Negative for abdominal pain, nausea and vomiting.    Social History  Substance Use Topics  . Smoking status: Current Every Day Smoker    Packs/day: 1.00    Years: 30.00    Types: Cigarettes    Start date: 05/31/1975  . Smokeless tobacco: Never Used     Comment: started smoking as a teenager average 1 ppd  . Alcohol use No     Comment: quit drking in his 47s.    Objective:   BP (!) 150/88 (BP Location: Right Arm, Patient Position: Sitting, Cuff Size: Large)   Pulse 66   Temp 97.9 F (36.6 C) (Oral)   Resp 16   Wt 164 lb (74.4 kg)   SpO2 99%   BMI 24.22 kg/m     Physical Exam   General Appearance:    Alert,  cooperative, no distress  Eyes:    PERRL, conjunctiva/corneas clear, EOM's intact       Lungs:     Clear to auscultation bilaterally, respirations unlabored  Heart:    Regular rate and rhythm  Neurologic:   Awake, alert, oriented x 3. No apparent focal neurological           defect.           Assessment & Plan:     1. Hyponatremia Sent to [facility] for follow up urine and serum chemistries. May be due to polydipsia or effect of carbamazepine.   2. Essential hypertension  - amLODipine (NORVASC) 2.5 MG tablet; Take 1 tablet (2.5 mg total) by mouth daily.  Dispense: 30 tablet; Refill: 3  3. Smoking greater than 30 pack years  - buPROPion (WELLBUTRIN SR) 150 MG 12 hr tablet; 1 tablet daily for 3 days, then 1 tablet twice daily. Stop smoking 14 days after starting medication  Dispense: 60 tablet; Refill: 5        Mila Merry, MD  [facility]

## 2017-01-10 LAB — RENAL FUNCTION PANEL
Albumin: 4.4 g/dL (ref 3.5–5.5)
BUN/Creatinine Ratio: 14 (ref 9–20)
BUN: 10 mg/dL (ref 6–24)
CO2: 24 mmol/L (ref 18–29)
Calcium: 9.2 mg/dL (ref 8.7–10.2)
Chloride: 89 mmol/L — ABNORMAL LOW (ref 96–106)
Creatinine, Ser: 0.74 mg/dL — ABNORMAL LOW (ref 0.76–1.27)
GFR calc Af Amer: 117 mL/min/{1.73_m2} (ref >59)
GFR, EST NON AFRICAN AMERICAN: 101 mL/min/{1.73_m2} (ref >59)
GLUCOSE: 110 mg/dL — AB (ref 65–99)
POTASSIUM: 4.7 mmol/L (ref 3.5–5.2)
Phosphorus: 3.7 mg/dL (ref 2.5–4.5)
Sodium: 127 mmol/L — ABNORMAL LOW (ref 134–144)

## 2017-01-10 LAB — OSMOLALITY: Osmolality Meas: 262 mOsmol/kg — ABNORMAL LOW (ref 275–295)

## 2017-01-10 LAB — SODIUM, URINE, RANDOM: Sodium, Ur: 61 mmol/L

## 2017-01-10 LAB — OSMOLALITY, URINE: Osmolality, Ur: 202 mOsmol/kg

## 2017-01-12 ENCOUNTER — Telehealth: Payer: Self-pay | Admitting: Family Medicine

## 2017-01-12 NOTE — Telephone Encounter (Signed)
Patient was notified of results. Expressed understanding. Patient wants to think about referral first. He will call back in a couple of days to give [place] his answer.

## 2017-01-12 NOTE — Telephone Encounter (Signed)
Pt wife is returning call.  ZO#[phone]/[phone]

## 2017-01-12 NOTE — Telephone Encounter (Signed)
Pt called stating that he is returning a call from [place]. pt states Marcelino Duster can call him back @ [phone]. Thanks CC

## 2017-01-12 NOTE — Telephone Encounter (Signed)
-----   Message from Malva Limes, MD sent at 01/11/2017  1:55 PM EDT ----- Labs show he continues to have low sodium level probably related to his medication (carbamazepine) He needs referral to endocrinology for further evaluation and treatment and he may want to check with his psychiatrist to see if dose of carbamazepine can  e reduced.

## 2017-01-13 NOTE — Telephone Encounter (Signed)
Patient was notified of results.  

## 2017-01-14 ENCOUNTER — Telehealth: Payer: Self-pay

## 2017-01-14 NOTE — Telephone Encounter (Signed)
Patient called stating he was recently told that his lab results showed that he had low sodium levels. A referral to Endocrinology was recommended, but patient declined referral. Patient was also advised to check with his psychiatrist to see if Carbamezapine needed to be reduced. Patient has not been able to follow up with psychiatry because his next appointment is not until 03/2017. Patient wants to know if he should take OTC Thermo Tabs which is a salt supplement that his pharmacist recommend? He wants to know if it is safe to take with the new blood pressure medication (Amlodipine)? The Thermo tabs include 's of  Sodium, 's of  Potassium and 's  of Chloride.

## 2017-01-15 NOTE — Telephone Encounter (Signed)
Patient was notified. Expressed understanding.  

## 2017-01-15 NOTE — Telephone Encounter (Signed)
He can try the thermotabs for a month. Will need to come in a month to check sodium levels and blood pressure.

## 2017-02-19 ENCOUNTER — Encounter: Payer: Self-pay | Admitting: Family Medicine

## 2017-02-19 ENCOUNTER — Ambulatory Visit (INDEPENDENT_AMBULATORY_CARE_PROVIDER_SITE_OTHER): Payer: Managed Care, Other (non HMO) | Admitting: Family Medicine

## 2017-02-19 VITALS — BP 142/78 | HR 72 | Temp 97.6°F | Resp 16 | Wt 164.0 lb

## 2017-02-19 DIAGNOSIS — Z23 Encounter for immunization: Secondary | ICD-10-CM | POA: Diagnosis not present

## 2017-02-19 DIAGNOSIS — I10 Essential (primary) hypertension: Secondary | ICD-10-CM | POA: Diagnosis not present

## 2017-02-19 DIAGNOSIS — E871 Hypo-osmolality and hyponatremia: Secondary | ICD-10-CM | POA: Diagnosis not present

## 2017-02-19 NOTE — Progress Notes (Signed)
Patient: Eric Hatfield Male    DOB: 1955-12-04   61 y.o.   MRN: [id number] Visit Date: 02/19/2017  Today's Provider: Mila Merry, MD   Chief Complaint  Patient presents with  . Hypertension   Subjective:    HPI  Hypertension, follow-up:  BP Readings from Last 3 Encounters:  02/19/17 (!) 142/78  01/08/17 (!) 150/84  12/11/16 (!) 154/90    He was last seen for hypertension 1 months ago.  BP at that visit was 150/84. Management since that visit includes adding carbamazepine 200mg . He reports good compliance with treatment. He is not having side effects.  He is not exercising. He is not adherent to low salt diet.   Outside blood pressures are checked occasionally. He is experiencing none.  Patient denies exertional chest pressure/discomfort, lower extremity edema and palpitations.   Cardiovascular risk factors include dyslipidemia.     Weight trend: stable Wt Readings from Last 3 Encounters:  02/19/17 164 lb (74.4 kg)  01/08/17 164 lb (74.4 kg)  12/11/16 163 lb (73.9 kg)    Current diet: well balanced    Hyponatremia, follow up: Patient comes in today for 1 month follow up on hyponatruria. Patient reports that he started on Thermotabs. He reports that has been tolerating meds well. He usually takes 2 tablets daily. 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  Results for orders placed or performed in visit on 12/16/16  Renal function panel  Result Value Ref Range   Glucose 110 (H) 65 - 99 mg/dL   BUN 10 6 - 24 mg/dL   Creatinine, Ser 2.44 (L) 0.76 - 1.27 mg/dL   GFR calc non Af Amer 101 >59 mL/min/1.73   GFR calc Af Amer 117 >59 mL/min/1.73   BUN/Creatinine Ratio 14 9 - 20   Sodium 127 (L) 134 - 144 mmol/L   Potassium 4.7 3.5 - 5.2 mmol/L   Chloride 89 (L) 96 - 106 mmol/L   CO2 24 18 - 29 mmol/L   Calcium 9.2 8.7 - 10.2 mg/dL   Phosphorus 3.7 2.5 - 4.5 mg/dL   Albumin 4.4 3.5 - 5.5 g/dL  Osmolality  Result Value Ref Range   Osmolality Meas 262 (L) 275 - 295 mOsmol/kg  Osmolality, urine  Result Value Ref Range   Osmolality, Ur 202 mOsmol/kg  Sodium, urine, random  Result Value Ref Range   Sodium, Ur 61 Not Estab. mmol/L       No Known Allergies   Current Outpatient Prescriptions:  .  amLODipine (NORVASC) 2.5 MG tablet, Take 1 tablet (2.5 mg total) by mouth daily., Disp: 30 tablet, Rfl: 3 .  buPROPion (WELLBUTRIN SR) 150 MG 12 hr tablet, 1 tablet daily for 3 days, then 1 tablet twice daily. Stop smoking 14 days after starting medication, Disp: 60 tablet, Rfl: 5 .  carbamazepine (TEGRETOL) 200 MG tablet, Take 2 tablets in the morning, 1 at noon and 3 at bedtime., Disp: 180 tablet, Rfl: 3  Review of Systems  Constitutional: Negative.   Respiratory: Negative.   Cardiovascular: Negative.   Musculoskeletal: Negative.   Neurological: Negative.     Social History  Substance Use Topics  . Smoking status: Current Every Day Smoker    Packs/day: 1.00    Years: 30.00    Types: Cigarettes    Start date: 05/31/1975  . Smokeless tobacco: Never Used     Comment: started smoking as a teenager average 1 ppd  . Alcohol  use No     Comment: quit drking in his 4440s.    Objective:   BP (!) 142/78 (BP Location: Right Arm, Patient Position: Sitting, Cuff Size: Normal)   Pulse 72   Temp 97.6 F (36.4 C)   Resp 16   Wt 164 lb (74.4 kg)   SpO2 98%   BMI 24.22 kg/m  Vitals:   02/19/17 0814  BP: (!) 142/78  Pulse: 72  Resp: 16  Temp: 97.6 F (36.4 C)  SpO2: 98%  Weight: 164 lb (74.4 kg)     Physical Exam  General appearance: alert, well developed, well nourished, cooperative and in no distress Head: Normocephalic, without obvious abnormality, atraumatic Respiratory: Respirations even and unlabored, normal respiratory rate Extremities: No gross deformities Skin: Skin color, texture, turgor normal. No rashes seen  Psych: Appropriate mood and affect. Neurologic: Mental status: Alert, oriented to person,  place, and time, thought content appropriate.     Assessment & Plan:     1. Hyponatremia Has started taking OTC Thermotabs.  - Renal function panel - Osmolality  2. Essential hypertension Consider increasing amlodipine to 5 mg after reviewing labs.   3. Need for prophylactic vaccination using tetanus and diphtheria toxoids adsorbed (Td) vaccine -Td       Mila Merryonald Cledis Sohn, MD  [facility]

## 2017-02-21 LAB — RENAL FUNCTION PANEL
ALBUMIN: 4.5 g/dL (ref 3.6–4.8)
BUN/Creatinine Ratio: 14 (ref 10–24)
BUN: 10 mg/dL (ref 8–27)
CHLORIDE: 89 mmol/L — AB (ref 96–106)
CO2: 23 mmol/L (ref 18–29)
Calcium: 9.1 mg/dL (ref 8.6–10.2)
Creatinine, Ser: 0.69 mg/dL — ABNORMAL LOW (ref 0.76–1.27)
GFR calc Af Amer: 119 mL/min/{1.73_m2} (ref >59)
GFR, EST NON AFRICAN AMERICAN: 103 mL/min/{1.73_m2} (ref >59)
Glucose: 97 mg/dL (ref 65–99)
POTASSIUM: 4.9 mmol/L (ref 3.5–5.2)
Phosphorus: 3.8 mg/dL (ref 2.5–4.5)
Sodium: 125 mmol/L — ABNORMAL LOW (ref 134–144)

## 2017-02-21 LAB — OSMOLALITY: Osmolality Meas: 258 mOsmol/kg — ABNORMAL LOW (ref 275–295)

## 2017-03-03 ENCOUNTER — Telehealth: Payer: Self-pay | Admitting: Family Medicine

## 2017-03-03 DIAGNOSIS — I10 Essential (primary) hypertension: Secondary | ICD-10-CM

## 2017-03-03 MED ORDER — AMLODIPINE BESYLATE 5 MG PO TABS: 5.0000 mg | ORAL_TABLET | Freq: Every day | ORAL | 3 refills | Status: DC

## 2017-03-03 NOTE — Telephone Encounter (Signed)
-----   Message from Malva Limesonald E Helix Lafontaine, MD sent at 02/23/2017  8:34 AM EDT ----- Regarding: FW: increase amlodipine to 5mg  if labs normal. before 03-10-2017   ----- Message ----- From: Malva LimesFisher, Debany Vantol E, MD Sent: 02/19/2017   8:35 AM To: Malva Limesonald E Jaque Dacy, MD Subject: increase amlodipine if labs normal.

## 2017-03-23 ENCOUNTER — Other Ambulatory Visit (HOSPITAL_COMMUNITY): Payer: Self-pay | Admitting: Psychiatry

## 2017-03-24 LAB — CARBAMAZEPINE LEVEL, TOTAL: Carbamazepine (Tegretol), S: 9.6 ug/mL (ref 4.0–12.0)

## 2017-03-29 ENCOUNTER — Encounter: Payer: Self-pay | Admitting: Psychiatry

## 2017-03-29 ENCOUNTER — Ambulatory Visit (INDEPENDENT_AMBULATORY_CARE_PROVIDER_SITE_OTHER): Payer: Managed Care, Other (non HMO) | Admitting: Psychiatry

## 2017-03-29 VITALS — BP 150/85 | HR 67 | Temp 97.6°F

## 2017-03-29 DIAGNOSIS — F3170 Bipolar disorder, currently in remission, most recent episode unspecified: Secondary | ICD-10-CM

## 2017-03-29 MED ORDER — CARBAMAZEPINE 200 MG PO TABS: ORAL_TABLET | ORAL | 1 refills | Status: DC

## 2017-03-29 NOTE — Progress Notes (Signed)
Patient ID: Eric Hatfield A [place], male   DOB: 1955-12-13, 61 y.o.   MRN: [id number]  [facility] Progress Note  03/29/2017 3:19 PM Eric BloodgoodRay A Fuster  MRN:  [id number]  Subjective:  Patient returns for follow-up of his bipolar disorder type II.Marland Kitchen. Patient reports doing well overall. States he has been put on medication for high blood pressure. His sodium levels have been low, we discussed tegretol can do this. He was prescribed sodium pills by his PCP. He wants to try lowering the tegretol dosage. Sleeping well and eating well. Denies any other issues with his mood.  Chief Complaint    Follow-up; Medication Refill     Visit Diagnosis:     ICD-10-CM   1. Bipolar disorder in full remission, most recent episode unspecified type (HCC) F31.70     Past Medical History:  Past Medical History:  Diagnosis Date  . Hypertension     Past Surgical History:  Procedure Laterality Date  . Impacted tooth     Family History:  Family History  Problem Relation Age of Onset  . Hypertension Mother   . Hyperlipidemia Father   . Hypertension Brother    Social History:  Social History   Social History  . Marital status: Married    Spouse name: N/A  . Number of children: N/A  . Years of education: N/A   Social History Main Topics  . Smoking status: Current Every Day Smoker    Packs/day: 1.00    Years: 30.00    Types: Cigarettes    Start date: 05/31/1975  . Smokeless tobacco: Never Used     Comment: started smoking as a teenager average 1 ppd  . Alcohol use No     Comment: quit drking in his 10040s.   . Drug use: No  . Sexual activity: Yes    Birth control/ protection: None   Other Topics Concern  . None   Social History Narrative  . None   Additional History:   Assessment:   Musculoskeletal: Strength & Muscle Tone: within normal limits Gait & Station: normal Patient leans: N/A  Psychiatric Specialty Exam: HPI  Review of Systems  Psychiatric/Behavioral: Negative for depression,  hallucinations, memory loss, substance abuse and suicidal ideas. The patient is not nervous/anxious and does not have insomnia.     Blood pressure (!) 150/85, pulse 67, temperature 97.6 F (36.4 C), temperature source Oral.There is no height or weight on file to calculate BMI.  General Appearance: Well Groomed  Eye Contact:  Good  Speech:  Normal Rate  Volume:  Normal  Mood:  Good  Affect:  bright, euthymic  Thought Process:  Linear  Orientation:  Full (Time, Place, and Person)  Thought Content:  Negative  Suicidal Thoughts:  No  Homicidal Thoughts:  No  Memory:  Immediate;   Good Recent;   Good Remote;   Good  Judgement:  Good  Insight:  Good  Psychomotor Activity:  Negative  Concentration:  Good  Recall:  Good  Fund of Knowledge: Good  Language: Good  Akathisia:  Negative  Handed:  Right  AIMS (if indicated):      Assets:  Communication Skills Desire for Improvement Social Support Vocational/Educational  ADL's:  Intact  Cognition: WNL  Sleep: good   Is the patient at risk to self?  No. Has the patient been a risk to self in the past 6 months?  No. Has the patient been a risk to self within the distant past?  No. Is the  patient a risk to others?  No. Has the patient been a risk to others in the past 6 months?  No. Has the patient been a risk to others within the distant past?  No.  Current Medications: Current Outpatient Prescriptions  Medication Sig Dispense Refill  . amLODipine (NORVASC) 5 MG tablet Take 1 tablet (5 mg total) by mouth daily. 30 tablet 3  . buPROPion (WELLBUTRIN SR) 150 MG 12 hr tablet 1 tablet daily for 3 days, then 1 tablet twice daily. Stop smoking 14 days after starting medication 60 tablet 5  . carbamazepine (TEGRETOL) 200 MG tablet Take 2 tablets in the morning and 2 tablets in the evening 120 tablet 1   No current facility-administered medications for this visit.     Medical Decision Making:  Established Problem, Stable/Improving (1)  and Review of Medication Regimen & Side Effects (2)  Treatment Plan Summary:Medication management and Plan   Change tegretol dosing to 400mg  po in the morning and 400mg  at bedtime.  Tegretol level on 03/23/2017 was 9.3 Patient told to monitor for mood symptoms.  RTC in 2 months or call before if necessary.  Taegan Haider 03/29/2017, 3:19 PM

## 2017-05-27 ENCOUNTER — Other Ambulatory Visit: Payer: Self-pay | Admitting: *Deleted

## 2017-05-27 DIAGNOSIS — F17210 Nicotine dependence, cigarettes, uncomplicated: Secondary | ICD-10-CM

## 2017-05-31 NOTE — Telephone Encounter (Signed)
Error

## 2017-06-01 ENCOUNTER — Ambulatory Visit (INDEPENDENT_AMBULATORY_CARE_PROVIDER_SITE_OTHER): Payer: 59 | Admitting: Psychiatry

## 2017-06-01 ENCOUNTER — Encounter: Payer: Self-pay | Admitting: Psychiatry

## 2017-06-01 VITALS — BP 143/77 | HR 73 | Temp 97.6°F | Wt 163.6 lb

## 2017-06-01 DIAGNOSIS — F3170 Bipolar disorder, currently in remission, most recent episode unspecified: Secondary | ICD-10-CM | POA: Diagnosis not present

## 2017-06-01 MED ORDER — CARBAMAZEPINE 200 MG PO TABS: ORAL_TABLET | ORAL | 2 refills | Status: DC

## 2017-06-01 NOTE — Progress Notes (Signed)
Patient ID: Eric Hatfield, male   DOB: [birth year], 61 y.o.   MRN: [id number]  [facility] MD/PA/NP OP Progress Note  06/01/2017 2:48 PM AMISH MINTZER  MRN:  [id number]  Subjective:  Patient returns for follow-up of his bipolar disorder type II.Marland Kitchen Patient reports doing well overall. Was able to tolerate change in tegretol to  twice daily. Denies any mood symptoms. Fair sleep and appetite.   Chief Complaint    Follow-up; Medication Refill     Visit Diagnosis:     ICD-10-CM   1. Bipolar disorder in full remission, most recent episode unspecified type (HCC) F31.70     Past Medical History:  Past Medical History:  Diagnosis Date  . Hypertension     Past Surgical History:  Procedure Laterality Date  . Impacted tooth     Family History:  Family History  Problem Relation Age of Onset  . Hypertension Mother   . Hyperlipidemia Father   . Hypertension Brother    Social History:  Social History   Social History  . Marital status: Married    Spouse name: N/A  . Number of children: N/A  . Years of education: N/A   Social History Main Topics  . Smoking status: Current Every Day Smoker    Packs/day: 1.00    Years: 30.00    Types: Cigarettes    Start date: 05/31/1975  . Smokeless tobacco: Never Used     Comment: started smoking as a teenager average 1 ppd  . Alcohol use No     Comment: quit drking in his 64s.   . Drug use: No  . Sexual activity: Yes    Birth control/ protection: None   Other Topics Concern  . None   Social History Narrative  . None   Additional History:   Assessment:   Musculoskeletal: Strength & Muscle Tone: within normal limits Gait & Station: normal Patient leans: N/A  Psychiatric Specialty Exam: HPI  Review of Systems  Psychiatric/Behavioral: Negative for depression, hallucinations, memory loss, substance abuse and suicidal ideas. The patient is not nervous/anxious and does not have insomnia.     Blood pressure (!) 143/77, pulse 73,  temperature 97.6 F (36.4 C), temperature source Oral, weight 163 lb 9.6 oz (74.2 kg).Body mass index is 24.16 kg/m.  General Appearance: Well Groomed  Eye Contact:  Good  Speech:  Normal Rate  Volume:  Normal  Mood:  Good  Affect:  bright, euthymic  Thought Process:  Linear  Orientation:  Full (Time, Place, and Person)  Thought Content:  Negative  Suicidal Thoughts:  No  Homicidal Thoughts:  No  Memory:  Immediate;   Good Recent;   Good Remote;   Good  Judgement:  Good  Insight:  Good  Psychomotor Activity:  Negative  Concentration:  Good  Recall:  Good  Fund of Knowledge: Good  Language: Good  Akathisia:  Negative  Handed:  Right  AIMS (if indicated):      Assets:  Communication Skills Desire for Improvement Social Support Vocational/Educational  ADL's:  Intact  Cognition: WNL  Sleep: good   Is the patient at risk to self?  No. Has the patient been a risk to self in the past 6 months?  No. Has the patient been a risk to self within the distant past?  No. Is the patient a risk to others?  No. Has the patient been a risk to others in the past 6 months?  No. Has the patient been a risk to  others within the distant past?  No.  Current Medications: Current Outpatient Prescriptions  Medication Sig Dispense Refill  . amLODipine (NORVASC) 5 MG tablet Take 1 tablet (5 mg total) by mouth daily. 30 tablet 3  . carbamazepine (TEGRETOL) 200 MG tablet Take 2 tablets in the morning and 2 tablets in the evening 120 tablet 1  . buPROPion (WELLBUTRIN SR) 150 MG 12 hr tablet 1 tablet daily for 3 days, then 1 tablet twice daily. Stop smoking 14 days after starting medication 60 tablet 5   No current facility-administered medications for this visit.     Medical Decision Making:  Established Problem, Stable/Improving (1) and Review of Medication Regimen & Side Effects (2)  Treatment Plan Summary:Medication management and Plan   Continue tegretol dosing at 400mg  po in the morning  and 400mg  at bedtime.  Tegretol level on 03/23/2017 was 9.3  RTC in 4 months or call before if necessary.  Calley Drenning 06/01/2017, 2:48 PM

## 2017-06-30 ENCOUNTER — Other Ambulatory Visit: Payer: Self-pay | Admitting: Family Medicine

## 2017-06-30 DIAGNOSIS — I10 Essential (primary) hypertension: Secondary | ICD-10-CM

## 2017-08-31 ENCOUNTER — Other Ambulatory Visit: Payer: Self-pay | Admitting: Psychiatry

## 2017-09-30 ENCOUNTER — Ambulatory Visit: Payer: Managed Care, Other (non HMO) | Admitting: Psychiatry

## 2017-10-14 ENCOUNTER — Encounter: Payer: Self-pay | Admitting: Psychiatry

## 2017-10-14 ENCOUNTER — Ambulatory Visit: Payer: 59 | Admitting: Psychiatry

## 2017-10-14 ENCOUNTER — Other Ambulatory Visit: Payer: Self-pay

## 2017-10-14 VITALS — BP 149/83 | HR 73 | Temp 97.7°F | Wt 162.8 lb

## 2017-10-14 DIAGNOSIS — F3170 Bipolar disorder, currently in remission, most recent episode unspecified: Secondary | ICD-10-CM | POA: Diagnosis not present

## 2017-10-14 MED ORDER — CARBAMAZEPINE 200 MG PO TABS: ORAL_TABLET | ORAL | 2 refills | Status: DC

## 2017-10-14 NOTE — Progress Notes (Signed)
Patient ID: Eric Hatfield, male   DOB: [birth year], 62 y.o.   MRN: [id number]  [facility]  10/14/2017 3:07 PM Eric Hatfield  MRN:  [id number]  Subjective:  Patient returns for follow-up of his bipolar disorder type II.Marland Kitchen. Patient reports doing well overall. Patient continues to take the Tegretol twice daily at 400 mg. States that he has been doing quite well. They had a good Christmas. He will obtain a Tegretol level at next visit. Denies any mood symptoms. Sleeping well and eating well.  Chief Complaint    Follow-up; Medication Refill     Visit Diagnosis:     ICD-10-CM   1. Bipolar disorder in full remission, most recent episode unspecified type (HCC) F31.70     Past Medical History:  Past Medical History:  Diagnosis Date  . Hypertension     Past Surgical History:  Procedure Laterality Date  . Impacted tooth     Family History:  Family History  Problem Relation Age of Onset  . Hypertension Mother   . Hyperlipidemia Father   . Hypertension Brother    Social History:  Social History   Socioeconomic History  . Marital status: Married    Spouse name: None  . Number of children: None  . Years of education: None  . Highest education level: None  Social Needs  . Financial resource strain: None  . Food insecurity - worry: None  . Food insecurity - inability: None  . Transportation needs - medical: None  . Transportation needs - non-medical: None  Occupational History  . None  Tobacco Use  . Smoking status: Current Every Day Smoker    Packs/day: 1.00    Years: 30.00    Pack years: 30.00    Types: Cigarettes    Start date: 05/31/1975  . Smokeless tobacco: Never Used  . Tobacco comment: started smoking as a teenager average 1 ppd  Substance and Sexual Activity  . Alcohol use: No    Alcohol/week: 0.0 oz    Comment: quit drking in his 1640s.   . Drug use: No  . Sexual activity: Yes    Birth control/protection: None  Other Topics Concern  . None   Social History Narrative  . None   Additional History:   Assessment:   Musculoskeletal: Strength & Muscle Tone: within normal limits Gait & Station: normal Patient leans: N/A  Psychiatric Specialty Exam: Medication Refill     Review of Systems  Psychiatric/Behavioral: Negative for depression, hallucinations, memory loss, substance abuse and suicidal ideas. The patient is not nervous/anxious and does not have insomnia.     Blood pressure (!) 149/83, pulse 73, temperature 97.7 F (36.5 C), temperature source Oral, weight 73.8 kg (162 lb 12.8 oz).Body mass index is 24.04 kg/m.  General Appearance: Well Groomed  Eye Contact:  Good  Speech:  Normal Rate  Volume:  Normal  Mood:  Good  Affect:  bright, euthymic  Thought Process:  Linear  Orientation:  Full (Time, Place, and Person)  Thought Content:  Negative  Suicidal Thoughts:  No  Homicidal Thoughts:  No  Memory:  Immediate;   Good Recent;   Good Remote;   Good  Judgement:  Good  Insight:  Good  Psychomotor Activity:  Negative  Concentration:  Good  Recall:  Good  Fund of Knowledge: Good  Language: Good  Akathisia:  Negative  Handed:  Right  AIMS (if indicated):      Assets:  Communication Skills Desire for Improvement  Social Support Vocational/Educational  ADL's:  Intact  Cognition: WNL  Sleep: good   Is the patient at risk to self?  No. Has the patient been a risk to self in the past 6 months?  No. Has the patient been a risk to self within the distant past?  No. Is the patient a risk to others?  No. Has the patient been a risk to others in the past 6 months?  No. Has the patient been a risk to others within the distant past?  No.  Current Medications: Current Outpatient Medications  Medication Sig Dispense Refill  . amLODipine (NORVASC) 5 MG tablet TAKE 1 TABLET BY MOUTH DAILY 30 tablet 5  . carbamazepine (TEGRETOL) 200 MG tablet Take 2 tablets in the morning and 2 tablets in the evening 120 tablet  2  . buPROPion (WELLBUTRIN SR) 150 MG 12 hr tablet 1 tablet daily for 3 days, then 1 tablet twice daily. Stop smoking 14 days after starting medication 60 tablet 5   No current facility-administered medications for this visit.     Medical Decision Making:  Established Problem, Stable/Improving (1) and Review of Medication Regimen & Side Effects (2)  Treatment Plan Summary:Medication management and Plan   Continue tegretol dosing at 400mg  po in the morning and 400mg  at bedtime.   Will obtain Tegretol level at next visit. Patient was given a lab slip. We discussed patient transferring care to Dr. Elna Breslow the patient would like to continue with this clinician for now.  RTC in 4 months or call before if necessary.  [name] 10/14/2017, 3:07 PM

## 2017-11-29 ENCOUNTER — Other Ambulatory Visit: Payer: Self-pay | Admitting: Family Medicine

## 2017-11-29 DIAGNOSIS — I10 Essential (primary) hypertension: Secondary | ICD-10-CM

## 2018-01-18 ENCOUNTER — Telehealth: Payer: Self-pay

## 2018-01-18 ENCOUNTER — Other Ambulatory Visit: Payer: Self-pay | Admitting: Psychiatry

## 2018-01-18 MED ORDER — CARBAMAZEPINE 200 MG PO TABS: ORAL_TABLET | ORAL | 2 refills | Status: DC

## 2018-01-18 NOTE — Telephone Encounter (Signed)
received a fax requesting a refill on medication pt needs refill on tegretol

## 2018-01-18 NOTE — Telephone Encounter (Signed)
pt wife called states that he needs a refill he will not have enough to do until his next appt next month. Pt has appt  02-10-18.   Disp Refills Start End   carbamazepine (TEGRETOL) 200 MG tablet 120 tablet 2 10/14/2017    Sig: Take 2 tablets in the morning and 2 tablets in the evening   Sent to pharmacy as: carbamazepine (TEGRETOL) 200 MG tablet   E-Prescribing Status: Receipt confirmed by pharmacy (10/14/2017 3:09 PM EST)

## 2018-01-18 NOTE — Telephone Encounter (Signed)
ok 

## 2018-01-18 NOTE — Telephone Encounter (Signed)
Can you please call in? Check dosage on his medication list. He takes Trileptal. Thank you.

## 2018-01-19 NOTE — Telephone Encounter (Signed)
Rx faxed and confirmed  tegretol  id [id number] order # [id number]

## 2018-01-31 ENCOUNTER — Other Ambulatory Visit: Payer: Self-pay | Admitting: Family Medicine

## 2018-01-31 DIAGNOSIS — I10 Essential (primary) hypertension: Secondary | ICD-10-CM

## 2018-02-10 ENCOUNTER — Ambulatory Visit: Payer: 59 | Admitting: Psychiatry

## 2018-02-18 ENCOUNTER — Other Ambulatory Visit (HOSPITAL_COMMUNITY): Payer: Self-pay | Admitting: Psychiatry

## 2018-02-19 LAB — CBC WITH DIFFERENTIAL/PLATELET
BASOS: 1 %
Basophils Absolute: 0.1 10*3/uL (ref 0.0–0.2)
EOS (ABSOLUTE): 0.2 10*3/uL (ref 0.0–0.4)
EOS: 3 %
HEMATOCRIT: 40.2 % (ref 37.5–51.0)
Hemoglobin: 14.3 g/dL (ref 13.0–17.7)
Immature Grans (Abs): 0 10*3/uL (ref 0.0–0.1)
Immature Granulocytes: 0 %
Lymphocytes Absolute: 1.4 10*3/uL (ref 0.7–3.1)
Lymphs: 25 %
MCH: 33.7 pg — ABNORMAL HIGH (ref 26.6–33.0)
MCHC: 35.6 g/dL (ref 31.5–35.7)
MCV: 95 fL (ref 79–97)
MONOS ABS: 0.7 10*3/uL (ref 0.1–0.9)
Monocytes: 12 %
Neutrophils Absolute: 3.5 10*3/uL (ref 1.4–7.0)
Neutrophils: 59 %
Platelets: 266 10*3/uL (ref 150–450)
RBC: 4.24 x10E6/uL (ref 4.14–5.80)
RDW: 12.9 % (ref 12.3–15.4)
WBC: 5.8 10*3/uL (ref 3.4–10.8)

## 2018-02-19 LAB — COMPREHENSIVE METABOLIC PANEL
A/G RATIO: 1.9 (ref 1.2–2.2)
ALT: 24 IU/L (ref 0–44)
AST: 21 IU/L (ref 0–40)
Albumin: 4.3 g/dL (ref 3.6–4.8)
Alkaline Phosphatase: 116 IU/L (ref 39–117)
BILIRUBIN TOTAL: 0.4 mg/dL (ref 0.0–1.2)
BUN/Creatinine Ratio: 14 (ref 10–24)
BUN: 10 mg/dL (ref 8–27)
CHLORIDE: 94 mmol/L — AB (ref 96–106)
CO2: 22 mmol/L (ref 20–29)
Calcium: 9.1 mg/dL (ref 8.6–10.2)
Creatinine, Ser: 0.73 mg/dL — ABNORMAL LOW (ref 0.76–1.27)
GFR calc Af Amer: 116 mL/min/{1.73_m2} (ref >59)
GFR calc non Af Amer: 100 mL/min/{1.73_m2} (ref >59)
GLOBULIN, TOTAL: 2.3 g/dL (ref 1.5–4.5)
Glucose: 103 mg/dL — ABNORMAL HIGH (ref 65–99)
POTASSIUM: 4.5 mmol/L (ref 3.5–5.2)
SODIUM: 130 mmol/L — AB (ref 134–144)
Total Protein: 6.6 g/dL (ref 6.0–8.5)

## 2018-02-19 LAB — HEPATIC FUNCTION PANEL: BILIRUBIN, DIRECT: 0.11 mg/dL (ref 0.00–0.40)

## 2018-02-19 LAB — CARBAMAZEPINE LEVEL, TOTAL: CARBAMAZEPINE LVL: 7.7 ug/mL (ref 4.0–12.0)

## 2018-02-22 ENCOUNTER — Ambulatory Visit: Payer: 59 | Admitting: Psychiatry

## 2018-02-22 ENCOUNTER — Encounter: Payer: Self-pay | Admitting: Psychiatry

## 2018-02-22 ENCOUNTER — Other Ambulatory Visit: Payer: Self-pay

## 2018-02-22 VITALS — BP 152/82 | HR 75 | Temp 97.5°F | Wt 163.0 lb

## 2018-02-22 DIAGNOSIS — F3170 Bipolar disorder, currently in remission, most recent episode unspecified: Secondary | ICD-10-CM | POA: Diagnosis not present

## 2018-02-22 MED ORDER — CARBAMAZEPINE 200 MG PO TABS: ORAL_TABLET | ORAL | 2 refills | Status: DC

## 2018-02-22 NOTE — Progress Notes (Signed)
Patient ID: Eric Hatfield, male   DOB: [birth year], 62 y.o.   MRN: [id number]  [facility] MD/PA/NP OP Progress Note  02/22/2018 2:42 PM Eric Hatfield  MRN:  [id number]  Subjective:  Patient returns for follow-up of his bipolar disorder type II.Marland Kitchen Patient reports doing well overall. Patient continues to take the Tegretol twice daily at 400 mg. States that he has been doing quite well. He has been enjoying fishing.  Denies any mood symptoms. Sleeping well and eating well.  Chief Complaint    Follow-up; Medication Refill     Visit Diagnosis:     ICD-10-CM   1. Bipolar disorder in full remission, most recent episode unspecified type (HCC) F31.70     Past Medical History:  Past Medical History:  Diagnosis Date  . Hypertension     Past Surgical History:  Procedure Laterality Date  . Impacted tooth     Family History:  Family History  Problem Relation Age of Onset  . Hypertension Mother   . Hyperlipidemia Father   . Hypertension Brother    Social History:  Social History   Socioeconomic History  . Marital status: Married    Spouse name: Not on file  . Number of children: Not on file  . Years of education: Not on file  . Highest education level: Not on file  Occupational History  . Not on file  Social Needs  . Financial resource strain: Not on file  . Food insecurity:    Worry: Not on file    Inability: Not on file  . Transportation needs:    Medical: Not on file    Non-medical: Not on file  Tobacco Use  . Smoking status: Current Every Day Smoker    Packs/day: 1.00    Years: 30.00    Pack years: 30.00    Types: Cigarettes    Start date: 05/31/1975  . Smokeless tobacco: Never Used  . Tobacco comment: started smoking as a teenager average 1 ppd  Substance and Sexual Activity  . Alcohol use: No    Alcohol/week: 0.0 oz    Comment: quit drking in his 90s.   . Drug use: No  . Sexual activity: Yes    Birth control/protection: None  Lifestyle  . Physical activity:    Days  per week: Not on file    Minutes per session: Not on file  . Stress: Not on file  Relationships  . Social connections:    Talks on phone: Not on file    Gets together: Not on file    Attends religious service: Not on file    Active member of club or organization: Not on file    Attends meetings of clubs or organizations: Not on file    Relationship status: Not on file  Other Topics Concern  . Not on file  Social History Narrative  . Not on file   Additional History:   Assessment:   Musculoskeletal: Strength & Muscle Tone: within normal limits Gait & Station: normal Patient leans: N/A  Psychiatric Specialty Exam: Medication Refill     Review of Systems  Psychiatric/Behavioral: Negative for depression, hallucinations, memory loss, substance abuse and suicidal ideas. The patient is not nervous/anxious and does not have insomnia.     Blood pressure (!) 152/82, pulse 75, temperature (!) 97.5 F (36.4 C), temperature source Oral, weight 73.9 kg (163 lb).Body mass index is 24.07 kg/m.  General Appearance: Well Groomed  Eye Contact:  Good  Speech:  Normal Rate  Volume:  Normal  Mood:  Good  Affect:  bright, euthymic  Thought Process:  Linear  Orientation:  Full (Time, Place, and Person)  Thought Content:  Negative  Suicidal Thoughts:  No  Homicidal Thoughts:  No  Memory:  Immediate;   Good Recent;   Good Remote;   Good  Judgement:  Good  Insight:  Good  Psychomotor Activity:  Negative  Concentration:  Good  Recall:  Good  Fund of Knowledge: Good  Language: Good  Akathisia:  Negative  Handed:  Right  AIMS (if indicated):      Assets:  Communication Skills Desire for Improvement Social Support Vocational/Educational  ADL's:  Intact  Cognition: WNL  Sleep: good   Is the patient at risk to self?  No. Has the patient been a risk to self in the past 6 months?  No. Has the patient been a risk to self within the distant past?  No. Is the patient a risk to  others?  No. Has the patient been a risk to others in the past 6 months?  No. Has the patient been a risk to others within the distant past?  No.  Current Medications: Current Outpatient Medications  Medication Sig Dispense Refill  . amLODipine (NORVASC) 5 MG tablet TAKE ONE TABLET EVERY DAY 30 tablet 0  . carbamazepine (TEGRETOL) 200 MG tablet Take 2 tablets in the morning and 2 tablets in the evening 120 tablet 2  . buPROPion (WELLBUTRIN SR) 150 MG 12 hr tablet 1 tablet daily for 3 days, then 1 tablet twice daily. Stop smoking 14 days after starting medication 60 tablet 5   No current facility-administered medications for this visit.     Medical Decision Making:  Established Problem, Stable/Improving (1) and Review of Medication Regimen & Side Effects (2)  Treatment Plan Summary:Medication management and Plan   Continue tegretol dosing at 400mg  po in the morning and 400mg  at bedtime.   Patient got his labs done last Friday. They have not yet uploaded into the system.  RTC in 3 months or call before if necessary.  Almena Hokenson 02/22/2018, 2:42 PM

## 2018-03-02 ENCOUNTER — Other Ambulatory Visit: Payer: Self-pay | Admitting: Family Medicine

## 2018-03-02 DIAGNOSIS — I10 Essential (primary) hypertension: Secondary | ICD-10-CM

## 2018-03-30 ENCOUNTER — Other Ambulatory Visit: Payer: Self-pay | Admitting: Family Medicine

## 2018-03-30 DIAGNOSIS — I10 Essential (primary) hypertension: Secondary | ICD-10-CM

## 2018-03-30 NOTE — Telephone Encounter (Signed)
Please advise patient that he is overdue for o.v. To follow up on BP medications. Need to schedule this month. Have sent prescription to get by until o.v.

## 2018-03-31 NOTE — Telephone Encounter (Signed)
Tried calling patient. Left message to call back. 

## 2018-04-01 NOTE — Telephone Encounter (Signed)
Patient advised and appointment scheduled 04/08/2018 at 8:40am.

## 2018-04-07 NOTE — Progress Notes (Signed)
Patient: Eric Hatfield Male    DOB: 1956-03-20   62 y.o.   MRN: [id number] Visit Date: 04/08/2018  Today's Provider: Mila Merry, MD   Chief Complaint  Patient presents with  . Hypertension    follow up   Subjective:    HPI   Hypertension, follow-up:   BP Readings from Last 3 Encounters:  04/08/18 (!) 142/80  02/19/17 (!) 142/78  01/08/17 (!) 150/84    He was last seen for hypertension 11 months ago.  BP at that visit was 142/78. Management since that visit includes; increased amlodipine from 5 mg to 2.5 mg qd.He reports good compliance with treatment. He is not having side effects.  He is not exercising. He is not adherent to low salt diet.   Outside blood pressures are not being checked. He is experiencing none.  Patient denies chest pain, chest pressure/discomfort, claudication, dyspnea, exertional chest pressure/discomfort, fatigue, irregular heart beat, lower extremity edema, near-syncope, orthopnea, palpitations, paroxysmal nocturnal dyspnea, syncope and tachypnea.   Cardiovascular risk factors include advanced age (older than 35 for men, 2 for women), hypertension, male gender and smoking/ tobacco exposure.  Use of agents associated with hypertension: none.   ------------------------------------------------------------------------  Hyponatremia From 02/19/2017-labs checked. Sodium level getting lower. Advised to lower water consumption. Referred to Nephrology (patient declined referral at that time).He is followed by Dr. Daleen Bo and low sodium is thought to be secondary to carbamazepine.    No Known Allergies   Current Outpatient Medications:  .  amLODipine (NORVASC) 5 MG tablet, TAKE 1 TABLET BY MOUTH DAILY, Disp: 30 tablet, Rfl: 0 .  carbamazepine (TEGRETOL) 200 MG tablet, Take 2 tablets in the morning and 2 tablets in the evening, Disp: 120 tablet, Rfl: 2 .  buPROPion (WELLBUTRIN SR) 150 MG 12 hr tablet, 1 tablet daily for 3 days, then 1 tablet twice  daily. Stop smoking 14 days after starting medication, Disp: 60 tablet, Rfl: 5  Review of Systems  Constitutional: Negative for appetite change, chills and fever.  Respiratory: Negative for chest tightness, shortness of breath and wheezing.   Cardiovascular: Negative for chest pain and palpitations.  Gastrointestinal: Negative for abdominal pain, nausea and vomiting.    Social History   Tobacco Use  . Smoking status: Current Every Day Smoker    Packs/day: 1.00    Years: 30.00    Pack years: 30.00    Types: Cigarettes    Start date: 05/31/1975  . Smokeless tobacco: Never Used  . Tobacco comment: started smoking as a teenager average 1 ppd  Substance Use Topics  . Alcohol use: No    Alcohol/week: 0.0 oz    Comment: quit drking in his 49s.    Objective:   BP (!) 142/80 (BP Location: Left Arm, Patient Position: Sitting, Cuff Size: Normal)   Pulse 70   Temp (!) 97.4 F (36.3 C) (Oral)   Resp 16   Wt 163 lb (73.9 kg)   SpO2 95% Comment: room air  BMI 24.07 kg/m     Physical Exam  General appearance: alert, well developed, well nourished, cooperative and in no distress Head: Normocephalic, without obvious abnormality, atraumatic Respiratory: Respirations even and unlabored, normal respiratory rate Extremities: No gross deformities Skin: Skin color, texture, turgor normal. No rashes seen  Psych: Appropriate mood and affect. Neurologic: Mental status: Alert, oriented to person, place, and time, thought content appropriate.      Assessment & Plan:     1. Essential hypertension Tolerating  amlodipine well, increase daily dose to 10mg , he will take two 5 days until they run out later this month.   2. Hyponatremia Likely secondary to carbamazepine.   3. Smoking greater than 30 pack years  - buPROPion (WELLBUTRIN SR) 150 MG 12 hr tablet; 1 tablet daily for 3 days, then 1 tablet twice daily. Stop smoking 14 days after starting medication  Dispense: 60 tablet; Refill: 5         Mila Merryonald Fisher, MD  [facility]

## 2018-04-08 ENCOUNTER — Ambulatory Visit: Payer: Self-pay | Admitting: Family Medicine

## 2018-04-08 ENCOUNTER — Encounter: Payer: Self-pay | Admitting: Family Medicine

## 2018-04-08 VITALS — BP 142/80 | HR 70 | Temp 97.4°F | Resp 16 | Wt 163.0 lb

## 2018-04-08 DIAGNOSIS — F17210 Nicotine dependence, cigarettes, uncomplicated: Secondary | ICD-10-CM

## 2018-04-08 DIAGNOSIS — I10 Essential (primary) hypertension: Secondary | ICD-10-CM

## 2018-04-08 DIAGNOSIS — E871 Hypo-osmolality and hyponatremia: Secondary | ICD-10-CM

## 2018-04-08 MED ORDER — BUPROPION HCL ER (SR) 150 MG PO TB12: ORAL_TABLET | ORAL | 5 refills | Status: DC

## 2018-04-08 MED ORDER — AMLODIPINE BESYLATE 5 MG PO TABS: 10.0000 mg | ORAL_TABLET | Freq: Every day | ORAL | 0 refills | Status: DC

## 2018-04-08 NOTE — Patient Instructions (Signed)
   Increase amlodipine to 2 of the 5mg  tablets daily. I'll send a new prescription for the 10mg  tablets to [facility] before your current bottle runs out at the end of the month.

## 2018-04-16 ENCOUNTER — Other Ambulatory Visit: Payer: Self-pay | Admitting: Family Medicine

## 2018-04-16 DIAGNOSIS — I10 Essential (primary) hypertension: Secondary | ICD-10-CM

## 2018-04-16 MED ORDER — AMLODIPINE BESYLATE 10 MG PO TABS: 10.0000 mg | ORAL_TABLET | Freq: Every day | ORAL | 5 refills | Status: DC

## 2018-05-20 ENCOUNTER — Encounter: Payer: Self-pay | Admitting: Family Medicine

## 2018-05-20 ENCOUNTER — Other Ambulatory Visit: Payer: Self-pay | Admitting: Family Medicine

## 2018-05-20 ENCOUNTER — Ambulatory Visit: Payer: 59 | Admitting: Family Medicine

## 2018-05-20 VITALS — BP 132/79 | HR 68 | Temp 98.0°F | Resp 16 | Ht 69.0 in | Wt 164.0 lb

## 2018-05-20 DIAGNOSIS — I10 Essential (primary) hypertension: Secondary | ICD-10-CM

## 2018-05-20 DIAGNOSIS — Z23 Encounter for immunization: Secondary | ICD-10-CM

## 2018-05-20 DIAGNOSIS — F17210 Nicotine dependence, cigarettes, uncomplicated: Secondary | ICD-10-CM

## 2018-05-20 MED ORDER — BUPROPION HCL ER (SR) 150 MG PO TB12: 150.0000 mg | ORAL_TABLET | Freq: Two times a day (BID) | ORAL | 5 refills | Status: DC

## 2018-05-20 NOTE — Progress Notes (Signed)
Patient: Eric Hatfield Male    DOB: 11-Mar-1956   62 y.o.   MRN: [id number] Visit Date: 05/20/2018  Today's Provider: Mila Merryonald Shanaye Rief, MD   Chief Complaint  Patient presents with  . Follow-up  . Hypertension   Subjective:    HPI   Hypertension, follow-up:  BP Readings from Last 3 Encounters:  05/20/18 132/79  04/08/18 (!) 142/80  02/19/17 (!) 142/78    He was last seen for hypertension 1 months ago.  BP at that visit was 142/80. Management since that visit includes; increased amlodipine to 10 mg qd.He reports good compliance with treatment. He is not having side effects. none He is not exercising. He is not adherent to low salt diet.   Outside blood pressures are nont checking. He is experiencing none.  Patient denies none.   Cardiovascular risk factors include advanced age (older than 6655 for men, 3065 for women).  Use of agents associated with hypertension: none.   ---------------------------------------------------------------  Smoking cessation Has been taking bupropion for the last few days and not having as strong of cravings, but still smoking over a ppd.   No Known Allergies   Current Outpatient Medications:  .  amLODipine (NORVASC) 10 MG tablet, Take 1 tablet (10 mg total) by mouth daily., Disp: 30 tablet, Rfl: 5 .  buPROPion (WELLBUTRIN SR) 150 MG 12 hr tablet, 1 tablet daily for 3 days, then 1 tablet twice daily. Stop smoking 14 days after starting medication, Disp: 60 tablet, Rfl: 5 .  carbamazepine (TEGRETOL) 200 MG tablet, Take 2 tablets in the morning and 2 tablets in the evening, Disp: 120 tablet, Rfl: 2  Review of Systems  Constitutional: Negative for appetite change, chills and fever.  Respiratory: Negative for chest tightness, shortness of breath and wheezing.   Cardiovascular: Negative for chest pain and palpitations.  Gastrointestinal: Negative for abdominal pain, nausea and vomiting.    Social History   Tobacco Use  . Smoking  status: Current Every Day Smoker    Packs/day: 1.00    Years: 30.00    Pack years: 30.00    Types: Cigarettes    Start date: 05/31/1975  . Smokeless tobacco: Never Used  . Tobacco comment: started smoking as a teenager average 1 ppd  Substance Use Topics  . Alcohol use: No    Alcohol/week: 0.0 standard drinks    Comment: quit drking in his 9840s.    Objective:   BP 132/79 (BP Location: Right Arm, Cuff Size: Large)   Pulse 68   Temp 98 F (36.7 C) (Oral)   Resp 16   Ht 5\' 9"  (1.753 m)   Wt 164 lb (74.4 kg)   SpO2 98%   BMI 24.22 kg/m  Vitals:   05/20/18 0944 05/20/18 0951  BP: (!) 152/84 132/79  Pulse: 68   Resp: 16   Temp: 98 F (36.7 C)   TempSrc: Oral   SpO2: 98%   Weight: 164 lb (74.4 kg)   Height: 5\' 9"  (1.753 m)      Physical Exam   General Appearance:    Alert, cooperative, no distress  Eyes:    PERRL, conjunctiva/corneas clear, EOM's intact       Lungs:     Clear to auscultation bilaterally, respirations unlabored  Heart:    Regular rate and rhythm          Assessment & Plan:     1. Essential hypertension Doing better with increased dose of amlodipine.  2. Smoking greater than 30 pack years Tolerating bupropion, but had not significantly reduced smoking. Encouraged to set quit date in the next couple of weeks, and if he is not able to quit, to continue medications, and continue attempts to quit completely.   3. Need for influenza vaccination  - Flu Vaccine QUAD 36+ mos IM  Return in about 6 months (around 11/21/2018) for Yearly Physical.       Mila Merry, MD  [facility]

## 2018-05-20 NOTE — Addendum Note (Signed)
Addended by: Malva LimesFISHER, DONALD E on: 05/20/2018 12:31 PM   Modules accepted: Orders

## 2018-06-20 ENCOUNTER — Telehealth: Payer: Self-pay

## 2018-06-20 NOTE — Telephone Encounter (Signed)
received a fax requesting a refill on carbamazepine 200mg  pt was last seen on  02-22-18 next appt  06-30-18  carbamazepine (TEGRETOL) 200 MG tablet  Medication  Date: 02/22/2018 Department: [facility] Ordering/Authorizing: Patrick North, MD  Order Providers   Prescribing Provider Encounter Provider  Patrick North, MD Patrick North, MD  Outpatient Medication Detail    Disp Refills Start End   carbamazepine (TEGRETOL) 200 MG tablet 120 tablet 2 02/22/2018    Sig: Take 2 tablets in the morning and 2 tablets in the evening   Class: Print

## 2018-06-21 ENCOUNTER — Other Ambulatory Visit: Payer: Self-pay | Admitting: Psychiatry

## 2018-06-21 MED ORDER — CARBAMAZEPINE 200 MG PO TABS: ORAL_TABLET | ORAL | 2 refills | Status: DC

## 2018-06-21 NOTE — Telephone Encounter (Signed)
ok 

## 2018-06-22 NOTE — Telephone Encounter (Signed)
faxed and confirmed rx for tergretol 200mg  id# [id number] order # [id number]

## 2018-06-23 ENCOUNTER — Ambulatory Visit: Payer: 59 | Admitting: Psychiatry

## 2018-06-30 ENCOUNTER — Encounter: Payer: Self-pay | Admitting: Psychiatry

## 2018-06-30 ENCOUNTER — Ambulatory Visit: Payer: 59 | Admitting: Psychiatry

## 2018-06-30 ENCOUNTER — Other Ambulatory Visit: Payer: Self-pay

## 2018-06-30 VITALS — BP 147/82 | HR 73 | Temp 98.0°F | Wt 163.4 lb

## 2018-06-30 DIAGNOSIS — F3170 Bipolar disorder, currently in remission, most recent episode unspecified: Secondary | ICD-10-CM

## 2018-06-30 MED ORDER — CARBAMAZEPINE 200 MG PO TABS: ORAL_TABLET | ORAL | 2 refills | Status: DC

## 2018-06-30 NOTE — Progress Notes (Signed)
Patient ID: Eric Hatfield, male   DOB: 1956/05/09, 62 y.o.   MRN: [id number]  [facility] MD/PA/NP OP Progress Note  06/30/2018 3:37 PM SELDON BARRELL  MRN:  [id number]  Subjective:  Patient returns for follow-up of his bipolar disorder type II.Marland Kitchen Patient reports doing well overall. Patient continues to take the Tegretol twice daily at 400 mg. States that he has been doing quite well. He has been enjoying fishing.  Denies any mood symptoms. Sleeping well and eating well.  Chief Complaint    Follow-up; Medication Refill     Visit Diagnosis:     ICD-10-CM   1. Bipolar disorder in full remission, most recent episode unspecified type (HCC) F31.70     Past Medical History:  Past Medical History:  Diagnosis Date  . Hypertension     Past Surgical History:  Procedure Laterality Date  . Impacted tooth     Family History:  Family History  Problem Relation Age of Onset  . Hypertension Mother   . Hyperlipidemia Father   . Hypertension Brother    Social History:  Social History   Socioeconomic History  . Marital status: Married    Spouse name: Not on file  . Number of children: Not on file  . Years of education: Not on file  . Highest education level: Not on file  Occupational History  . Not on file  Social Needs  . Financial resource strain: Not on file  . Food insecurity:    Worry: Not on file    Inability: Not on file  . Transportation needs:    Medical: Not on file    Non-medical: Not on file  Tobacco Use  . Smoking status: Current Every Day Smoker    Packs/day: 1.00    Years: 30.00    Pack years: 30.00    Types: Cigarettes    Start date: 05/31/1975  . Smokeless tobacco: Never Used  . Tobacco comment: started smoking as a teenager average 1 ppd  Substance and Sexual Activity  . Alcohol use: No    Alcohol/week: 0.0 standard drinks    Comment: quit drking in his 65s.   . Drug use: No  . Sexual activity: Yes    Birth control/protection: None  Lifestyle  . Physical  activity:    Days per week: Not on file    Minutes per session: Not on file  . Stress: Not on file  Relationships  . Social connections:    Talks on phone: Not on file    Gets together: Not on file    Attends religious service: Not on file    Active member of club or organization: Not on file    Attends meetings of clubs or organizations: Not on file    Relationship status: Not on file  Other Topics Concern  . Not on file  Social History Narrative  . Not on file   Additional History:   Assessment:   Musculoskeletal: Strength & Muscle Tone: within normal limits Gait & Station: normal Patient leans: N/A  Psychiatric Specialty Exam: Medication Refill     Review of Systems  Psychiatric/Behavioral: Negative for depression, hallucinations, memory loss, substance abuse and suicidal ideas. The patient is not nervous/anxious and does not have insomnia.     Blood pressure (!) 155/81, pulse 71, temperature 98 F (36.7 C), temperature source Oral, weight 163 lb 6.4 oz (74.1 kg).Body mass index is 24.13 kg/m.  General Appearance: Well Groomed  Eye Contact:  Good  Speech:  Normal Rate  Volume:  Normal  Mood:  Good  Affect:  bright, euthymic  Thought Process:  Linear  Orientation:  Full (Time, Place, and Person)  Thought Content:  Negative  Suicidal Thoughts:  No  Homicidal Thoughts:  No  Memory:  Immediate;   Good Recent;   Good Remote;   Good  Judgement:  Good  Insight:  Good  Psychomotor Activity:  Negative  Concentration:  Good  Recall:  Good  Fund of Knowledge: Good  Language: Good  Akathisia:  Negative  Handed:  Right  AIMS (if indicated):      Assets:  Communication Skills Desire for Improvement Social Support Vocational/Educational  ADL's:  Intact  Cognition: WNL  Sleep: good   Is the patient at risk to self?  No. Has the patient been a risk to self in the past 6 months?  No. Has the patient been a risk to self within the distant past?  No. Is the  patient a risk to others?  No. Has the patient been a risk to others in the past 6 months?  No. Has the patient been a risk to others within the distant past?  No.  Current Medications: Current Outpatient Medications  Medication Sig Dispense Refill  . amLODipine (NORVASC) 10 MG tablet Take 1 tablet (10 mg total) by mouth daily. 30 tablet 5  . buPROPion (WELLBUTRIN SR) 150 MG 12 hr tablet Take 1 tablet (150 mg total) by mouth 2 (two) times daily. 60 tablet 5  . carbamazepine (TEGRETOL) 200 MG tablet Take 2 tablets in the morning and 2 tablets in the evening 120 tablet 2   No current facility-administered medications for this visit.     Medical Decision Making:  Established Problem, Stable/Improving (1) and Review of Medication Regimen & Side Effects (2)  Treatment Plan Summary:Medication management and Plan   Continue tegretol dosing at 400mg  po in the morning and 400mg  at bedtime.   Patient got his labs done 5/19- Tegretol level at 7.7, CBC wnl, Lipid panel wnl except for cholesterol at 229.  RTC in 3 months or call before if necessary.  Samina Weekes 06/30/2018, 3:37 PM

## 2018-09-19 ENCOUNTER — Other Ambulatory Visit: Payer: Self-pay | Admitting: Family Medicine

## 2018-09-19 DIAGNOSIS — I10 Essential (primary) hypertension: Secondary | ICD-10-CM

## 2018-11-21 ENCOUNTER — Encounter: Payer: Self-pay | Admitting: Family Medicine

## 2018-11-29 ENCOUNTER — Other Ambulatory Visit: Payer: Self-pay

## 2018-11-29 ENCOUNTER — Encounter: Payer: Self-pay | Admitting: Psychiatry

## 2018-11-29 ENCOUNTER — Ambulatory Visit (INDEPENDENT_AMBULATORY_CARE_PROVIDER_SITE_OTHER): Payer: 59 | Admitting: Psychiatry

## 2018-11-29 VITALS — BP 144/81 | HR 80 | Temp 97.6°F | Wt 167.6 lb

## 2018-11-29 DIAGNOSIS — F3170 Bipolar disorder, currently in remission, most recent episode unspecified: Secondary | ICD-10-CM | POA: Diagnosis not present

## 2018-11-29 MED ORDER — CARBAMAZEPINE 200 MG PO TABS: ORAL_TABLET | ORAL | 2 refills | Status: DC

## 2018-11-29 NOTE — Progress Notes (Signed)
Patient ID: Eric Hatfield, male   DOB: March 25, 1956, 63 y.o.   MRN: [id number]  [facility] MD/PA/NP OP Progress Note  11/29/2018 4:00 PM Eric Hatfield  MRN:  [id number]  Subjective:  Patient returns for follow-up of his bipolar disorder type II.Marland Kitchen Patient reports doing well overall. Patient continues to take the Tegretol twice daily at 400 mg. States that he has been doing quite well. He started taking wellbutrin for smoking cessation. He has been enjoying fishing.  Denies any mood symptoms. Sleeping well and eating well.  Chief Complaint    Follow-up; Medication Refill     Visit Diagnosis:     ICD-10-CM   1. Bipolar disorder in full remission, most recent episode unspecified type (HCC) F31.70     Past Medical History:  Past Medical History:  Diagnosis Date  . Hypertension     Past Surgical History:  Procedure Laterality Date  . Impacted tooth     Family History:  Family History  Problem Relation Age of Onset  . Hypertension Mother   . Hyperlipidemia Father   . Hypertension Brother    Social History:  Social History   Socioeconomic History  . Marital status: Married    Spouse name: Not on file  . Number of children: Not on file  . Years of education: Not on file  . Highest education level: Not on file  Occupational History  . Not on file  Social Needs  . Financial resource strain: Not on file  . Food insecurity:    Worry: Not on file    Inability: Not on file  . Transportation needs:    Medical: Not on file    Non-medical: Not on file  Tobacco Use  . Smoking status: Current Every Day Smoker    Packs/day: 1.00    Years: 30.00    Pack years: 30.00    Types: Cigarettes    Start date: 05/31/1975  . Smokeless tobacco: Never Used  . Tobacco comment: started smoking as a teenager average 1 ppd  Substance and Sexual Activity  . Alcohol use: No    Alcohol/week: 0.0 standard drinks    Comment: quit drking in his 33s.   . Drug use: No  . Sexual activity: Yes    Birth  control/protection: None  Lifestyle  . Physical activity:    Days per week: Not on file    Minutes per session: Not on file  . Stress: Not on file  Relationships  . Social connections:    Talks on phone: Not on file    Gets together: Not on file    Attends religious service: Not on file    Active member of club or organization: Not on file    Attends meetings of clubs or organizations: Not on file    Relationship status: Not on file  Other Topics Concern  . Not on file  Social History Narrative  . Not on file   Additional History:   Assessment:   Musculoskeletal: Strength & Muscle Tone: within normal limits Gait & Station: normal Patient leans: N/A  Psychiatric Specialty Exam: Medication Refill     Review of Systems  Psychiatric/Behavioral: Negative for depression, hallucinations, memory loss, substance abuse and suicidal ideas. The patient is not nervous/anxious and does not have insomnia.     Blood pressure (!) 157/85, pulse 80, temperature 97.6 F (36.4 C), temperature source Tympanic, weight 167 lb 9.6 oz (76 kg).Body mass index is 24.75 kg/m.  General Appearance: Well Groomed  Eye Contact:  Good  Speech:  Normal Rate  Volume:  Normal  Mood:  Good  Affect:  bright, euthymic  Thought Process:  Linear  Orientation:  Full (Time, Place, and Person)  Thought Content:  Negative  Suicidal Thoughts:  No  Homicidal Thoughts:  No  Memory:  Immediate;   Good Recent;   Good Remote;   Good  Judgement:  Good  Insight:  Good  Psychomotor Activity:  Negative  Concentration:  Good  Recall:  Good  Fund of Knowledge: Good  Language: Good  Akathisia:  Negative  Handed:  Right  AIMS (if indicated):      Assets:  Communication Skills Desire for Improvement Social Support Vocational/Educational  ADL's:  Intact  Cognition: WNL  Sleep: good   Is the patient at risk to self?  No. Has the patient been a risk to self in the past 6 months?  No. Has the patient been a  risk to self within the distant past?  No. Is the patient a risk to others?  No. Has the patient been a risk to others in the past 6 months?  No. Has the patient been a risk to others within the distant past?  No.  Current Medications: Current Outpatient Medications  Medication Sig Dispense Refill  . amLODipine (NORVASC) 10 MG tablet TAKE ONE TABLET BY MOUTH EVERY DAY 30 tablet 12  . buPROPion (WELLBUTRIN SR) 150 MG 12 hr tablet Take 1 tablet (150 mg total) by mouth 2 (two) times daily. 60 tablet 5  . carbamazepine (TEGRETOL) 200 MG tablet Take 2 tablets in the morning and 2 tablets in the evening 120 tablet 2   No current facility-administered medications for this visit.     Medical Decision Making:  Established Problem, Stable/Improving (1) and Review of Medication Regimen & Side Effects (2)  Treatment Plan Summary:Medication management and Plan   Continue tegretol dosing at 400mg  po in the morning and 400mg  at bedtime.  Obtain labs at next visit.   RTC in 3 months or call before if necessary.  Eric Hatfield 11/29/2018, 4:00 PM

## 2019-02-09 ENCOUNTER — Telehealth: Payer: Self-pay

## 2019-02-09 NOTE — Telephone Encounter (Signed)
This is a patient of Dr. Daleen Bo, I was told that they are putting these patients on your schedule. He currently has a follow up in the system with Ravi, but I am assuming the front desk will be moving that. Patient states he needs an order for labs before his appointment. Please review and advise, thank you

## 2019-02-09 NOTE — Telephone Encounter (Signed)
done

## 2019-02-24 ENCOUNTER — Other Ambulatory Visit: Payer: Self-pay | Admitting: Psychiatry

## 2019-02-25 LAB — CBC WITH DIFFERENTIAL/PLATELET
Basophils Absolute: 0.1 10*3/uL (ref 0.0–0.2)
Basos: 1 %
EOS (ABSOLUTE): 0.2 10*3/uL (ref 0.0–0.4)
Eos: 3 %
Hematocrit: 39 % (ref 37.5–51.0)
Hemoglobin: 14.3 g/dL (ref 13.0–17.7)
Immature Grans (Abs): 0 10*3/uL (ref 0.0–0.1)
Immature Granulocytes: 0 %
Lymphocytes Absolute: 1.4 10*3/uL (ref 0.7–3.1)
Lymphs: 22 %
MCH: 34 pg — ABNORMAL HIGH (ref 26.6–33.0)
MCHC: 36.7 g/dL — ABNORMAL HIGH (ref 31.5–35.7)
MCV: 93 fL (ref 79–97)
Monocytes Absolute: 0.7 10*3/uL (ref 0.1–0.9)
Monocytes: 12 %
Neutrophils Absolute: 3.9 10*3/uL (ref 1.4–7.0)
Neutrophils: 62 %
Platelets: 262 10*3/uL (ref 150–450)
RBC: 4.2 x10E6/uL (ref 4.14–5.80)
RDW: 11.5 % — ABNORMAL LOW (ref 11.6–15.4)
WBC: 6.2 10*3/uL (ref 3.4–10.8)

## 2019-02-25 LAB — CARBAMAZEPINE LEVEL, TOTAL: Carbamazepine (Tegretol), S: 8.5 ug/mL (ref 4.0–12.0)

## 2019-02-25 LAB — COMPREHENSIVE METABOLIC PANEL
ALT: 20 IU/L (ref 0–44)
AST: 21 IU/L (ref 0–40)
Albumin/Globulin Ratio: 1.8 (ref 1.2–2.2)
Albumin: 4.2 g/dL (ref 3.8–4.8)
Alkaline Phosphatase: 120 IU/L — ABNORMAL HIGH (ref 39–117)
BUN/Creatinine Ratio: 12 (ref 10–24)
BUN: 9 mg/dL (ref 8–27)
Bilirubin Total: 0.4 mg/dL (ref 0.0–1.2)
CO2: 20 mmol/L (ref 20–29)
Calcium: 9.3 mg/dL (ref 8.6–10.2)
Chloride: 94 mmol/L — ABNORMAL LOW (ref 96–106)
Creatinine, Ser: 0.74 mg/dL — ABNORMAL LOW (ref 0.76–1.27)
GFR calc Af Amer: 114 mL/min/{1.73_m2} (ref >59)
GFR calc non Af Amer: 99 mL/min/{1.73_m2} (ref >59)
Globulin, Total: 2.4 g/dL (ref 1.5–4.5)
Glucose: 105 mg/dL — ABNORMAL HIGH (ref 65–99)
Potassium: 4.6 mmol/L (ref 3.5–5.2)
Sodium: 128 mmol/L — ABNORMAL LOW (ref 134–144)
Total Protein: 6.6 g/dL (ref 6.0–8.5)

## 2019-02-27 ENCOUNTER — Telehealth: Payer: Self-pay | Admitting: Psychiatry

## 2019-02-27 NOTE — Telephone Encounter (Signed)
Reviewed labs - Na + - low at 128 , tegretol level - 8.5 - therapeutic. Alk phosphatase - high. Will let patient know as well as send copy of lab report to PMD. Jessica CMA - to call patient and let PMD aware. 

## 2019-02-28 ENCOUNTER — Ambulatory Visit (INDEPENDENT_AMBULATORY_CARE_PROVIDER_SITE_OTHER): Payer: 59 | Admitting: Psychiatry

## 2019-02-28 ENCOUNTER — Other Ambulatory Visit: Payer: Self-pay

## 2019-02-28 ENCOUNTER — Encounter: Payer: Self-pay | Admitting: Psychiatry

## 2019-02-28 DIAGNOSIS — F3176 Bipolar disorder, in full remission, most recent episode depressed: Secondary | ICD-10-CM

## 2019-02-28 DIAGNOSIS — E871 Hypo-osmolality and hyponatremia: Secondary | ICD-10-CM

## 2019-02-28 MED ORDER — CARBAMAZEPINE 200 MG PO TABS: ORAL_TABLET | ORAL | 1 refills | Status: DC

## 2019-02-28 NOTE — Progress Notes (Signed)
Virtual Visit via Telephone Note  I connected with Eric Hatfield on 02/28/19 at  4:15 PM EDT by telephone and verified that I am speaking with the correct person using two identifiers.   I discussed the limitations, risks, security and privacy concerns of performing an evaluation and management service by telephone and the availability of in person appointments. I also discussed with the patient that there may be a patient responsible charge related to this service. The patient expressed understanding and agreed to proceed.    I discussed the assessment and treatment plan with the patient. The patient was provided an opportunity to ask questions and all were answered. The patient agreed with the plan and demonstrated an understanding of the instructions.   The patient was advised to call back or seek an in-person evaluation if the symptoms worsen or if the condition fails to improve as anticipated.  BH MD OP Progress Note  02/28/2019 5:23 PM Eric Hatfield  MRN:  [id number]  Chief Complaint:  Chief Complaint    Follow-up     HPI: Eric Hatfield is a 63 year old Caucasian male, married, lives in [place], has a history of bipolar disorder currently in remission, was evaluated by phone today.  Patient was offered a video called however declined.  Patient today reports he is currently doing well on the current medication regimen.  He denies any significant depressive symptoms or hypomanic or manic symptoms at this time.  He reports he has been coping okay with the current COVID-19 outbreak.  Patient reports his sleep is good.  He reports appetite is fair.  He denies any suicidality, homicidality or perceptual disturbances.  Reviewed and discussed labs with patient.  His Tegretol level came back therapeutic.  However his alkaline phosphatase and sodium level was abnormal.  This was discussed with patient.  Discussed with patient to reach out to his primary care provider for further  management.  Patient denies any other concerns today.  Reviewed medical records in [facility] from Dr. Daleen Boavi dated 11/29/2018- patient with bipolar disorder in remission-we will continue Tegretol twice daily.'  Visit Diagnosis:    ICD-10-CM   1. Bipolar disorder, in full remission, most recent episode depressed (HCC) F31.76 carbamazepine (TEGRETOL) 200 MG tablet  2. Hyponatremia E87.1     Past Psychiatric History: Patient was under the care of Dr. Daleen Boavi here in clinic.  Patient does report one inpatient mental health admission several years ago in [place].  Denies suicide attempts.  Past Medical History:  Past Medical History:  Diagnosis Date  . Hypertension     Past Surgical History:  Procedure Laterality Date  . Impacted tooth      Family Psychiatric History: As noted below.  Family History:  Family History  Problem Relation Age of Onset  . Hypertension Mother   . Hyperlipidemia Father   . Hypertension Brother   . Mental illness Paternal Grandmother     Social History: Patient is married.  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  He has stepchildren and step grandchildren. Social History   Socioeconomic History  . Marital status: Married    Spouse name: Not on file  . Number of children: Not on file  . Years of education: Not on file  . Highest education level: Not on file  Occupational History  . Not on file  Social Needs  . Financial resource strain: Not on file  . Food insecurity:    Worry: Not on file    Inability:  Not on file  . Transportation needs:    Medical: Not on file    Non-medical: Not on file  Tobacco Use  . Smoking status: Current Every Day Smoker    Packs/day: 1.00    Years: 30.00    Pack years: 30.00    Types: Cigarettes    Start date: 05/31/1975  . Smokeless tobacco: Never Used  . Tobacco comment: started smoking as a teenager average 1 ppd  Substance and Sexual Activity  . Alcohol use: No    Alcohol/week: 0.0 standard  drinks    Comment: quit drking in his 4s.   . Drug use: No  . Sexual activity: Yes    Birth control/protection: None  Lifestyle  . Physical activity:    Days per week: Not on file    Minutes per session: Not on file  . Stress: Not on file  Relationships  . Social connections:    Talks on phone: Not on file    Gets together: Not on file    Attends religious service: Not on file    Active member of club or organization: Not on file    Attends meetings of clubs or organizations: Not on file    Relationship status: Not on file  Other Topics Concern  . Not on file  Social History Narrative  . Not on file    Allergies: No Known Allergies  Metabolic Disorder Labs: Lab Results  Component Value Date   HGBA1C 5.8 12/24/2010   No results found for: PROLACTIN Lab Results  Component Value Date   CHOL 201 (H) 12/11/2016   TRIG 40 12/11/2016   HDL 91 12/11/2016   CHOLHDL 2.2 12/11/2016   VLDL 6 04/27/2015   LDLCALC 102 (H) 12/11/2016   LDLCALC 145 (H) 04/27/2015   Lab Results  Component Value Date   TSH 1.160 12/11/2016    Therapeutic Level Labs: No results found for: LITHIUM No results found for: VALPROATE No components found for:  CBMZ  Current Medications: Current Outpatient Medications  Medication Sig Dispense Refill  . amLODipine (NORVASC) 10 MG tablet TAKE ONE TABLET BY MOUTH EVERY DAY 30 tablet 12  . carbamazepine (TEGRETOL) 200 MG tablet Take 2 tablets in the morning and 2 tablets in the evening 360 tablet 1   No current facility-administered medications for this visit.      Musculoskeletal: Strength & Muscle Tone: UTA Gait & Station: UTA Patient leans: N/A  Psychiatric Specialty Exam: Review of Systems  Psychiatric/Behavioral: The patient is not nervous/anxious.   All other systems reviewed and are negative.   There were no vitals taken for this visit.There is no height or weight on file to calculate BMI.  General Appearance: UTA  Eye Contact:  UTA   Speech:  Clear and Coherent  Volume:  Normal  Mood:  Euthymic  Affect:  UTA  Thought Process:  Goal Directed and Descriptions of Associations: Intact  Orientation:  Full (Time, Place, and Person)  Thought Content: Logical   Suicidal Thoughts:  No  Homicidal Thoughts:  No  Memory:  Immediate;   Fair Recent;   Fair Remote;   Fair  Judgement:  Fair  Insight:  Fair  Psychomotor Activity:  Normal  Concentration:  Concentration: Fair and Attention Span: Fair  Recall:  [employer] of Knowledge: Fair  Language: Fair  Akathisia:  No  Handed:  Right  AIMS (if indicated): Denies tremors ,rigidity  Assets:  Communication Skills Desire for Improvement Social Support  ADL's:  Intact  Cognition: WNL  Sleep:  Fair   Screenings: PHQ2-9     Office Visit from [facility]/2018 in [facility] Office Visit from 12/11/2016 in [facility]  PHQ-2 Total Score  0  0  PHQ-9 Total Score  0  0       Assessment and Plan: Eric Hatfield is a 63 year old Caucasian male, married, employed, lives in [place], has a history of bipolar disorder in remission was evaluated by phone today.  Patient is currently doing well on the current medication regimen.  However patient's most recent labs were discussed and he is hyponatremic as well as has an elevated alkaline phosphatase.  Discussed with patient plan as noted below.  Plan Bipolar disorder in remission Continue Tegretol 400 mg p.o. twice daily. Tegretol level- 02/24/2019- 8.5-therapeutic.  For hyponatremia-unstable Patient advised to follow-up with his primary care provider and a copy of his lab also faxed to his primary care provider.  Patient also has abnormal alkaline phosphatase-elevated -we will follow-up with PMD for management.  Reviewed medical records per Dr. Daleen Boavi as summarized above.  Follow-up in clinic in 2 to 3 months or sooner if needed.  September 9 at 4:30 PM.  I have spent atleast 15 minutes non face to face with  patient today. More than 50 % of the time was spent for psychoeducation and supportive psychotherapy and care coordination.  This note was generated in part or whole with voice recognition software. Voice recognition is usually quite accurate but there are transcription errors that can and very often do occur. I apologize for any typographical errors that were not detected and corrected.        Jomarie LongsSaramma Terrisha Lopata, MD 02/28/2019, 5:23 PM

## 2019-02-28 NOTE — Telephone Encounter (Signed)
error 

## 2019-03-03 ENCOUNTER — Telehealth: Payer: Self-pay

## 2019-03-03 NOTE — Telephone Encounter (Signed)
Patient was advised. KW 

## 2019-03-03 NOTE — Telephone Encounter (Signed)
LMTCB 03/03/2019   Thanks,   -Laura 

## 2019-03-03 NOTE — Telephone Encounter (Signed)
Patient is scheduled to have an office visit to discuss lab results on 03/06/2019 at 3:40pm. Patient says he is uncomfortable coming into the office due to [place]. Patient wants to know if he can change his appointment to a telephone visit? Please advise

## 2019-03-03 NOTE — Telephone Encounter (Signed)
Ok to cancel appointment. Only thing on labs was his low sodium level, which is always low. This is a side effect of his medication, and is aggravated by drinking too much water. He can get a little more salt in diet and cut back a little bit on the amount of water he drinks. Check labs about every six months.

## 2019-03-06 ENCOUNTER — Ambulatory Visit: Payer: Self-pay | Admitting: Family Medicine

## 2019-05-31 ENCOUNTER — Ambulatory Visit (INDEPENDENT_AMBULATORY_CARE_PROVIDER_SITE_OTHER): Payer: 59 | Admitting: Psychiatry

## 2019-05-31 ENCOUNTER — Encounter: Payer: Self-pay | Admitting: Psychiatry

## 2019-05-31 ENCOUNTER — Other Ambulatory Visit: Payer: Self-pay

## 2019-05-31 DIAGNOSIS — F3176 Bipolar disorder, in full remission, most recent episode depressed: Secondary | ICD-10-CM | POA: Insufficient documentation

## 2019-05-31 DIAGNOSIS — F17200 Nicotine dependence, unspecified, uncomplicated: Secondary | ICD-10-CM

## 2019-05-31 MED ORDER — BUPROPION HCL ER (SR) 150 MG PO TB12: 150.0000 mg | ORAL_TABLET | Freq: Two times a day (BID) | ORAL | 1 refills | Status: DC

## 2019-05-31 NOTE — Progress Notes (Signed)
Virtual Visit via Telephone Note  I connected with Eric Hatfield on 05/31/19 at  4:30 PM EDT by telephone and verified that I am speaking with the correct person using two identifiers.   I discussed the limitations, risks, security and privacy concerns of performing an evaluation and management service by telephone and the availability of in person appointments. I also discussed with the patient that there may be a patient responsible charge related to this service. The patient expressed understanding and agreed to proceed.    I discussed the assessment and treatment plan with the patient. The patient was provided an opportunity to ask questions and all were answered. The patient agreed with the plan and demonstrated an understanding of the instructions.   The patient was advised to call back or seek an in-person evaluation if the symptoms worsen or if the condition fails to improve as anticipated.   BH MD OP Progress Note  05/31/2019 5:26 PM Thalia BloodgoodRay A Searing  MRN:  [id number]  Chief Complaint:  Chief Complaint    Follow-up     HPI: Eric HammersRay is a 63 year old Caucasian male, married, lives in [place], has a history of bipolar disorder, currently in remission was evaluated by phone today.  Patient preferred to do a phone call.   Patient today reports he is currently doing well on the current medication regimen.  He is compliant on the Tegretol.  He denies any depression or hypomanic or manic episodes.  He reports sleep is good.  Patient reports appetite is good.  He denies any suicidality homicidality or perceptual disturbances.  He reports he is currently taking a [place]  Discussed with patient about smoking cessation, he reports he has Wellbutrin available up at home and wants to start taking it again.  Discussed with patient about his low sodium level as well as alkaline phosphatase which was elevated last visit.  Patient reports his primary  medical doctor is following up on the same.  He was advised to cut back on his water intake and to take more salt in his diet.  He has to follow-up with his PMD for the same in 6 months or so.   Visit Diagnosis:    ICD-10-CM   1. Bipolar disorder, in full remission, most recent episode depressed (HCC)  F31.76   2. Tobacco use disorder  F17.200     Past Psychiatric History: I have reviewed past psychiatric history from my progress note on 02/28/2019  Past Medical History:  Past Medical History:  Diagnosis Date  . Hypertension     Past Surgical History:  Procedure Laterality Date  . Impacted tooth      Family Psychiatric History: As noted below Family History:  Family History  Problem Relation Age of Onset  . Hypertension Mother   . Hyperlipidemia Father   . Hypertension Brother   . Mental illness Paternal Grandmother     Social History: Reviewed social history from my progress note on 02/28/2019 Social History   Socioeconomic History  . Marital status: Married    Spouse name: Not on file  . Number of children: Not on file  . Years of education: Not on file  . Highest education level: Not on file  Occupational History  . Not on file  Social Needs  . Financial resource strain: Not on file  . Food insecurity    Worry: Not on file    Inability: Not on file  . Transportation  needs    Medical: Not on file    Non-medical: Not on file  Tobacco Use  . Smoking status: Current Every Day Smoker    Packs/day: 1.00    Years: 30.00    Pack years: 30.00    Types: Cigarettes    Start date: 05/31/1975  . Smokeless tobacco: Never Used  . Tobacco comment: started smoking as a teenager average 1 ppd  Substance and Sexual Activity  . Alcohol use: No    Alcohol/week: 0.0 standard drinks    Comment: quit drking in his 65s.   . Drug use: No  . Sexual activity: Yes    Birth control/protection: None  Lifestyle  . Physical activity    Days per week: Not on file    Minutes per  session: Not on file  . Stress: Not on file  Relationships  . Social Herbalist on phone: Not on file    Gets together: Not on file    Attends religious service: Not on file    Active member of club or organization: Not on file    Attends meetings of clubs or organizations: Not on file    Relationship status: Not on file  Other Topics Concern  . Not on file  Social History Narrative  . Not on file    Allergies: No Known Allergies  Metabolic Disorder Labs: Lab Results  Component Value Date   HGBA1C 5.8 12/24/2010   No results found for: PROLACTIN Lab Results  Component Value Date   CHOL 201 (H) 12/11/2016   TRIG 40 12/11/2016   HDL 91 12/11/2016   CHOLHDL 2.2 12/11/2016   VLDL 6 04/27/2015   LDLCALC 102 (H) 12/11/2016   LDLCALC 145 (H) 04/27/2015   Lab Results  Component Value Date   TSH 1.160 12/11/2016    Therapeutic Level Labs: No results found for: LITHIUM No results found for: VALPROATE No components found for:  CBMZ  Current Medications: Current Outpatient Medications  Medication Sig Dispense Refill  . amLODipine (NORVASC) 10 MG tablet TAKE ONE TABLET BY MOUTH EVERY DAY 30 tablet 12  . buPROPion (WELLBUTRIN SR) 150 MG 12 hr tablet Take 1 tablet (150 mg total) by mouth 2 (two) times daily. 60 tablet 1  . carbamazepine (TEGRETOL) 200 MG tablet Take 2 tablets in the morning and 2 tablets in the evening 360 tablet 1   No current facility-administered medications for this visit.      Musculoskeletal: Strength & Muscle Tone: UTA Gait & Station: Reports as WNL Patient leans: N/A  Psychiatric Specialty Exam: Review of Systems  Psychiatric/Behavioral: Negative for hallucinations and substance abuse. The patient is not nervous/anxious.   All other systems reviewed and are negative.   There were no vitals taken for this visit.There is no height or weight on file to calculate BMI.  General Appearance: UTA  Eye Contact:  UTA  Speech:  Clear and  Coherent  Volume:  Normal  Mood:  Euthymic  Affect:  UTA  Thought Process:  Goal Directed and Descriptions of Associations: Intact  Orientation:  Full (Time, Place, and Person)  Thought Content: Logical   Suicidal Thoughts:  No  Homicidal Thoughts:  No  Memory:  Immediate;   Fair Recent;   Fair Remote;   Fair  Judgement:  Fair  Insight:  Fair  Psychomotor Activity:  UTA  Concentration:  Concentration: Fair and Attention Span: Fair  Recall:  [employer] of Knowledge: Fair  Language: Fair  Akathisia:  No  Handed:  Right  AIMS (if indicated): Denies tremors, rigidity  Assets:  Communication Skills Desire for Improvement Social Support  ADL's:  Intact  Cognition: WNL  Sleep:  Fair   Screenings: PHQ2-9     Office Visit from 05/20/2018 in [facility] Office Visit from 12/11/2016 in [facility]  PHQ-2 Total Score  0  0  PHQ-9 Total Score  0  0       Assessment and Plan: Rocket is a 63 year old Caucasian male, married, employed, lives in [place], has a history of bipolar disorder in remission was evaluated by phone today.  Patient is currently doing well on the current medication regimen.  Continue plan as noted below.  Plan Bipolar disorder in remission Tegretol 400 mg p.o. twice daily Tegretol level on 02/24/2019-8.5-therapeutic  For history of hyponatremia- patient was advised to follow-up with primary care provider.  He also had abnormal alkaline phosphatase.  Patient reports he has to follow-up with his primary medical doctor in 6 months or so.  He reports he was advised to cut back on his salt intake and to cut back on his water intake.  Tobacco use disorder-unstable He wants to restart Wellbutrin SR 150 mg twice a day.  He reports he has medication available at home from a previous prescription.  Follow-up in clinic in 3 months or sooner if needed.  December 10 at 2:45 PM  I have spent atleast 15 MINUTES NON  face to face with patient today.  More than 50 % of the time was spent for psychoeducation and supportive psychotherapy and care coordination. This note was generated in part or whole with voice recognition software. Voice recognition is usually quite accurate but there are transcription errors that can and very often do occur. I apologize for any typographical errors that were not detected and corrected.       Jomarie Longs, MD 05/31/2019, 5:26 PM

## 2019-06-21 ENCOUNTER — Ambulatory Visit: Payer: Self-pay | Admitting: Family Medicine

## 2019-06-23 ENCOUNTER — Ambulatory Visit: Payer: BC Managed Care – PPO | Admitting: Family Medicine

## 2019-06-23 ENCOUNTER — Other Ambulatory Visit: Payer: Self-pay

## 2019-06-23 ENCOUNTER — Encounter: Payer: Self-pay | Admitting: Family Medicine

## 2019-06-23 VITALS — BP 138/72 | HR 80 | Temp 97.9°F | Resp 16 | Ht 69.5 in | Wt 161.0 lb

## 2019-06-23 DIAGNOSIS — F17210 Nicotine dependence, cigarettes, uncomplicated: Secondary | ICD-10-CM

## 2019-06-23 DIAGNOSIS — I10 Essential (primary) hypertension: Secondary | ICD-10-CM | POA: Diagnosis not present

## 2019-06-23 DIAGNOSIS — F3176 Bipolar disorder, in full remission, most recent episode depressed: Secondary | ICD-10-CM | POA: Diagnosis not present

## 2019-06-23 DIAGNOSIS — Z1211 Encounter for screening for malignant neoplasm of colon: Secondary | ICD-10-CM

## 2019-06-23 DIAGNOSIS — E871 Hypo-osmolality and hyponatremia: Secondary | ICD-10-CM

## 2019-06-23 DIAGNOSIS — Z125 Encounter for screening for malignant neoplasm of prostate: Secondary | ICD-10-CM | POA: Diagnosis not present

## 2019-06-23 DIAGNOSIS — Z23 Encounter for immunization: Secondary | ICD-10-CM

## 2019-06-23 DIAGNOSIS — Z1159 Encounter for screening for other viral diseases: Secondary | ICD-10-CM | POA: Diagnosis not present

## 2019-06-23 NOTE — Progress Notes (Signed)
Patient: Eric Hatfield Male    DOB: 05-02-1956   63 y.o.   MRN: [id number] Visit Date: 06/23/2019  Today's Provider: Lelon Huh, MD   Chief Complaint  Patient presents with  . Hypertension  . low sodium levels   Subjective:   HPI  Hypertension, follow-up:  BP Readings from Last 3 Encounters:  06/23/19 138/72  05/20/18 132/79  04/08/18 (!) 142/80    He was last seen for hypertension 1 years ago.  BP at that visit was 142/80. Management since that visit includes no changes. He reports good compliance with treatment. He is not having side effects.  He is not exercising. He is adherent to low salt diet.   Outside blood pressures are checked occasionally. He is experiencing none.  Patient denies exertional chest pressure/discomfort, lower extremity edema and palpitations.    Weight trend: stable Wt Readings from Last 3 Encounters:  06/23/19 161 lb (73 kg)  05/20/18 164 lb (74.4 kg)  04/08/18 163 lb (73.9 kg)    Current diet: well balanced  Patient also mentions that he has had low sodium levels. He reports that this has been an ongoing issue. Last sodium checked by dr. Shea Evans in June was 128. He is on carbamazepine. Denies excessive thirst, but drinks several glasses of water a day.   No Known Allergies   Current Outpatient Medications:  .  amLODipine (NORVASC) 10 MG tablet, TAKE ONE TABLET BY MOUTH EVERY DAY, Disp: 30 tablet, Rfl: 12 .  carbamazepine (TEGRETOL) 200 MG tablet, Take 2 tablets in the morning and 2 tablets in the evening, Disp: 360 tablet, Rfl: 1 .  buPROPion (WELLBUTRIN SR) 150 MG 12 hr tablet, Take 1 tablet (150 mg total) by mouth 2 (two) times daily. (Patient not taking: Reported on 06/23/2019), Disp: 60 tablet, Rfl: 1  Review of Systems  Constitutional: Negative for fatigue.  Respiratory: Negative for cough and shortness of breath.   Cardiovascular: Negative for chest pain, palpitations and leg swelling.  Musculoskeletal: Positive for  arthralgias.  Neurological: Negative for dizziness, light-headedness and headaches.  Psychiatric/Behavioral: Negative for agitation, self-injury, sleep disturbance and suicidal ideas. The patient is not nervous/anxious.     Social History   Tobacco Use  . Smoking status: Current Every Day Smoker    Packs/day: 1.00    Years: 30.00    Pack years: 30.00    Types: Cigarettes    Start date: 05/31/1975  . Smokeless tobacco: Never Used  . Tobacco comment: started smoking as a teenager average 1 ppd  Substance Use Topics  . Alcohol use: No    Alcohol/week: 0.0 standard drinks    Comment: quit drking in his 77s.       Objective:   BP 138/72   Pulse 80   Temp 97.9 F (36.6 C)   Resp 16   Ht 5' 9.5" (1.765 m)   Wt 161 lb (73 kg)   SpO2 98%   BMI 23.43 kg/m    Physical Exam   General Appearance:    Well developed, well nourished male in no acute distress  Eyes:    PERRL, conjunctiva/corneas clear, EOM's intact       Lungs:     Clear to auscultation bilaterally, respirations unlabored  Heart:    Normal heart rate. Normal rhythm.  2/6 systolic murmur at right upper sternal border  MS:   All extremities are intact. Normal skin turgor, no edema, appears Euvolemic  Neurologic:   Awake, alert,  oriented x 3. No apparent focal neurological           defect.         Assessment & Plan    1. Hyponatremia Polydipsia versus SIADH and/or adverse effect carbamazepine.  - Sodium, urine, random - Osmolality, urine - Osmolality  2. Essential hypertension Well controlled.  Continue current medications.   - Lipid panel - Comprehensive metabolic panel - TSH  3. Bipolar disorder, in full remission, most recent episode depressed (HCC) Fairly well controlled on current medications managed by Dr. Laurence Spates  4. Smoking greater than 30 pack years Encouraged LDCT screening about which he is skeptical .   5. Need for hepatitis C screening test  - Hepatitis C antibody  6. Prostate cancer  screening  - PSA  7. Need for influenza vaccination  - Flu Vaccine QUAD 6+ mos PF IM (Fluarix Quad PF)  8. Colon cancer screening  - Cologuard  The entirety of the information documented in the History of Present Illness, Review of Systems and Physical Exam were personally obtained by me. Portions of this information were initially documented by Anson Oregon, CMA and reviewed by me for thoroughness and accuracy.      Mila Merry, MD  [facility]

## 2019-06-25 LAB — COMPREHENSIVE METABOLIC PANEL
ALT: 23 IU/L (ref 0–44)
AST: 21 IU/L (ref 0–40)
Albumin/Globulin Ratio: 2 (ref 1.2–2.2)
Albumin: 4.6 g/dL (ref 3.8–4.8)
Alkaline Phosphatase: 151 IU/L — ABNORMAL HIGH (ref 39–117)
BUN/Creatinine Ratio: 12 (ref 10–24)
BUN: 10 mg/dL (ref 8–27)
Bilirubin Total: 0.2 mg/dL (ref 0.0–1.2)
CO2: 22 mmol/L (ref 20–29)
Calcium: 9.3 mg/dL (ref 8.6–10.2)
Chloride: 89 mmol/L — ABNORMAL LOW (ref 96–106)
Creatinine, Ser: 0.81 mg/dL (ref 0.76–1.27)
GFR calc Af Amer: 110 mL/min/{1.73_m2} (ref >59)
GFR calc non Af Amer: 95 mL/min/{1.73_m2} (ref >59)
Globulin, Total: 2.3 g/dL (ref 1.5–4.5)
Glucose: 117 mg/dL — ABNORMAL HIGH (ref 65–99)
Potassium: 4.2 mmol/L (ref 3.5–5.2)
Sodium: 125 mmol/L — ABNORMAL LOW (ref 134–144)
Total Protein: 6.9 g/dL (ref 6.0–8.5)

## 2019-06-25 LAB — OSMOLALITY, URINE: Osmolality, Ur: 194 mOsmol/kg

## 2019-06-25 LAB — LIPID PANEL
Chol/HDL Ratio: 2.6 ratio (ref 0.0–5.0)
Cholesterol, Total: 211 mg/dL — ABNORMAL HIGH (ref 100–199)
HDL: 82 mg/dL (ref >39)
LDL Chol Calc (NIH): 122 mg/dL — ABNORMAL HIGH (ref 0–99)
Triglycerides: 40 mg/dL (ref 0–149)
VLDL Cholesterol Cal: 7 mg/dL (ref 5–40)

## 2019-06-25 LAB — PSA: Prostate Specific Ag, Serum: 0.6 ng/mL (ref 0.0–4.0)

## 2019-06-25 LAB — OSMOLALITY: Osmolality Meas: 259 mOsmol/kg — ABNORMAL LOW (ref 280–301)

## 2019-06-25 LAB — HEPATITIS C ANTIBODY: Hep C Virus Ab: 0.1 s/co ratio (ref 0.0–0.9)

## 2019-06-25 LAB — SODIUM, URINE, RANDOM: Sodium, Ur: 36 mmol/L

## 2019-06-27 ENCOUNTER — Telehealth: Payer: Self-pay

## 2019-06-27 NOTE — Telephone Encounter (Signed)
-----   Message from Birdie Sons, MD sent at 06/26/2019  5:01 PM EDT ----- Please add TSH to labs drawn 06/23/2019

## 2019-06-27 NOTE — Telephone Encounter (Signed)
Contacted [facility] and had lab added. KW 

## 2019-06-28 ENCOUNTER — Telehealth: Payer: Self-pay | Admitting: *Deleted

## 2019-06-28 ENCOUNTER — Encounter: Payer: Self-pay | Admitting: Family Medicine

## 2019-06-28 ENCOUNTER — Telehealth: Payer: Self-pay

## 2019-06-28 ENCOUNTER — Other Ambulatory Visit: Payer: Self-pay | Admitting: Family Medicine

## 2019-06-28 DIAGNOSIS — E871 Hypo-osmolality and hyponatremia: Secondary | ICD-10-CM

## 2019-06-28 DIAGNOSIS — F17210 Nicotine dependence, cigarettes, uncomplicated: Secondary | ICD-10-CM

## 2019-06-28 DIAGNOSIS — E222 Syndrome of inappropriate secretion of antidiuretic hormone: Secondary | ICD-10-CM | POA: Insufficient documentation

## 2019-06-28 NOTE — Telephone Encounter (Signed)
[id number] 

## 2019-06-28 NOTE — Telephone Encounter (Signed)
Advised, Patient does not know if he wants to proceed with the CT

## 2019-06-28 NOTE — Telephone Encounter (Signed)
-----   Message from Birdie Sons, MD sent at 06/28/2019  8:04 AM EDT ----- Low sodium, this might be a side effect of carbamezepine, need to limit fluid intake to no more than 24 ounces He also need LDCT lung CT for lung cancer screening due to smoking history. Low sodium can be caused by lung cancer so this needs to be ruled out. Order has been entered

## 2019-06-28 NOTE — Telephone Encounter (Signed)
Received referral for low dose lung cancer screening CT scan. Message left at phone number listed in EMR for patient to call me back to facilitate scheduling scan.  

## 2019-06-29 ENCOUNTER — Telehealth: Payer: Self-pay | Admitting: *Deleted

## 2019-06-29 NOTE — Telephone Encounter (Signed)
Contacted patient regarding lung screening scan referral. Patient refuses having lung screening scan at this time. He is aware of the rational for doing a lung screening scan and implications of not finding lung cancers at an early stage.

## 2019-06-30 DIAGNOSIS — R011 Cardiac murmur, unspecified: Secondary | ICD-10-CM | POA: Insufficient documentation

## 2019-06-30 NOTE — Telephone Encounter (Signed)
Please see below, Amparo Bristol

## 2019-06-30 NOTE — Telephone Encounter (Signed)
Ok, he needs to schedule follow up in a 1 month to check sodium levels and can discuss at that time.

## 2019-06-30 NOTE — Patient Instructions (Signed)
.   Please review the attached list of medications and notify my office if there are any errors.   . Please bring all of your medications to every appointment so we can make sure that our medication list is the same as yours.   . It is especially important to get the annual flu vaccine this year. If you haven't had it already, please go to your pharmacy or call the office as soon as possible to schedule you flu shot.  

## 2019-06-30 NOTE — Telephone Encounter (Signed)
[id number] 

## 2019-07-05 NOTE — Telephone Encounter (Signed)
[id number] 

## 2019-07-10 NOTE — Telephone Encounter (Signed)
FYI: I called patient to advise him as below. I asked to speak with Eric Hatfield. Patient replied "this is he". I went on to introduce myself and told him that I was calling from [facility]. After that, patient hung up the phone. I called the number right back in case the call was somehow accidentally disconnected. Patient answered the phone and then hung up again.

## 2019-07-19 LAB — SPECIMEN STATUS REPORT

## 2019-07-19 LAB — TSH: TSH: 1.5 u[IU]/mL (ref 0.450–4.500)

## 2019-07-23 NOTE — Telephone Encounter (Signed)
Please advise patient it is time to check sodium since levels have been low. Please print order and leave at lab.

## 2019-07-24 NOTE — Telephone Encounter (Signed)
LMTCB

## 2019-07-28 NOTE — Telephone Encounter (Signed)
LMTCB

## 2019-07-31 NOTE — Telephone Encounter (Signed)
Patient advised. He said "they alright I reckon, alright bye" and patient then hung the phone up on me.

## 2019-08-31 ENCOUNTER — Other Ambulatory Visit: Payer: Self-pay

## 2019-08-31 ENCOUNTER — Ambulatory Visit: Payer: Self-pay | Admitting: Psychiatry

## 2019-10-10 ENCOUNTER — Ambulatory Visit (INDEPENDENT_AMBULATORY_CARE_PROVIDER_SITE_OTHER): Payer: BC Managed Care – PPO | Admitting: Psychiatry

## 2019-10-10 ENCOUNTER — Encounter: Payer: Self-pay | Admitting: Psychiatry

## 2019-10-10 ENCOUNTER — Other Ambulatory Visit: Payer: Self-pay

## 2019-10-10 DIAGNOSIS — F3176 Bipolar disorder, in full remission, most recent episode depressed: Secondary | ICD-10-CM

## 2019-10-10 DIAGNOSIS — F17200 Nicotine dependence, unspecified, uncomplicated: Secondary | ICD-10-CM | POA: Diagnosis not present

## 2019-10-10 NOTE — Progress Notes (Signed)
Virtual Visit via Telephone Note  I connected with Eric Hatfield on 10/10/19 at  4:30 PM EST by telephone and verified that I am speaking with the correct person using two identifiers.   I discussed the limitations, risks, security and privacy concerns of performing an evaluation and management service by telephone and the availability of in person appointments. I also discussed with the patient that there may be a patient responsible charge related to this service. The patient expressed understanding and agreed to proceed.     I discussed the assessment and treatment plan with the patient. The patient was provided an opportunity to ask questions and all were answered. The patient agreed with the plan and demonstrated an understanding of the instructions.   The patient was advised to call back or seek an in-person evaluation if the symptoms worsen or if the condition fails to improve as anticipated.   BH MD OP Progress Note  10/10/2019 5:05 PM Eric Hatfield  MRN:  [id number]  Chief Complaint:  Chief Complaint    Follow-up     HPI: Eric Hatfield is a 64 year old Caucasian male, married, lives in [place], has a history of bipolar disorder, was evaluated by phone today.  Patient preferred to do a phone call.  Patient today reports he is currently doing well on the current medication regimen.  He continues to be compliant on Tegretol.  He denies side effects.  He reports sleep and appetite is good.  Patient denies any suicidality, homicidality or perceptual disturbances. Visit Diagnosis:    ICD-10-CM   1. Bipolar disorder, in full remission, most recent episode depressed (HCC)  F31.76   2. Tobacco use disorder  F17.200     Past Psychiatric History: I have reviewed past psychiatric history from my progress note on 02/28/2019.  Past Medical History:  Past Medical History:  Diagnosis Date  . Hypertension     Past Surgical History:  Procedure Laterality Date  . Impacted tooth       Family Psychiatric History: Reviewed family psychiatric history from my progress note on 02/28/2019.  Family History:  Family History  Problem Relation Age of Onset  . Hypertension Mother   . Hyperlipidemia Father   . Hypertension Brother   . Mental illness Paternal Grandmother     Social History: Reviewed social history from my progress note on 02/28/2019. Social History   Socioeconomic History  . Marital status: Married    Spouse name: Not on file  . Number of children: Not on file  . Years of education: Not on file  . Highest education level: Not on file  Occupational History  . Not on file  Tobacco Use  . Smoking status: Current Every Day Smoker    Packs/day: 1.00    Years: 30.00    Pack years: 30.00    Types: Cigarettes    Start date: 05/31/1975  . Smokeless tobacco: Never Used  . Tobacco comment: started smoking as a teenager average 1 ppd  Substance and Sexual Activity  . Alcohol use: No    Alcohol/week: 0.0 standard drinks    Comment: quit drking in his 58s.   . Drug use: No  . Sexual activity: Yes    Birth control/protection: None  Other Topics Concern  . Not on file  Social History Narrative  . Not on file   Social Determinants of Health   Financial Resource Strain:   . Difficulty of Paying Living Expenses: Not on file  Food Insecurity:   .  Worried About Programme researcher, broadcasting/film/video in the Last Year: Not on file  . Ran Out of Food in the Last Year: Not on file  Transportation Needs:   . Lack of Transportation (Medical): Not on file  . Lack of Transportation (Non-Medical): Not on file  Physical Activity:   . Days of Exercise per Week: Not on file  . Minutes of Exercise per Session: Not on file  Stress:   . Feeling of Stress : Not on file  Social Connections:   . Frequency of Communication with Friends and Family: Not on file  . Frequency of Social Gatherings with Friends and Family: Not on file  . Attends Religious Services: Not on file  . Active Member  of Clubs or Organizations: Not on file  . Attends Banker Meetings: Not on file  . Marital Status: Not on file    Allergies: No Known Allergies  Metabolic Disorder Labs: Lab Results  Component Value Date   HGBA1C 5.8 12/24/2010   No results found for: PROLACTIN Lab Results  Component Value Date   CHOL 211 (H) 06/23/2019   TRIG 40 06/23/2019   HDL 82 06/23/2019   CHOLHDL 2.6 06/23/2019   VLDL 6 04/27/2015   LDLCALC 122 (H) 06/23/2019   LDLCALC 102 (H) 12/11/2016   Lab Results  Component Value Date   TSH 1.500 06/23/2019   TSH 1.160 12/11/2016    Therapeutic Level Labs: No results found for: LITHIUM No results found for: VALPROATE No components found for:  CBMZ  Current Medications: Current Outpatient Medications  Medication Sig Dispense Refill  . amLODipine (NORVASC) 10 MG tablet TAKE ONE TABLET BY MOUTH EVERY DAY 30 tablet 12  . buPROPion (WELLBUTRIN SR) 150 MG 12 hr tablet Take 1 tablet (150 mg total) by mouth 2 (two) times daily. (Patient not taking: Reported on 06/23/2019) 60 tablet 1  . carbamazepine (TEGRETOL) 200 MG tablet Take 2 tablets in the morning and 2 tablets in the evening 360 tablet 1   No current facility-administered medications for this visit.     Musculoskeletal: Strength & Muscle Tone: UTA Gait & Station: Reports as WNL Patient leans: N/A  Psychiatric Specialty Exam: Review of Systems  Psychiatric/Behavioral: Negative for agitation, behavioral problems, confusion, decreased concentration, dysphoric mood, hallucinations, self-injury, sleep disturbance and suicidal ideas. The patient is not nervous/anxious and is not hyperactive.   All other systems reviewed and are negative.   There were no vitals taken for this visit.There is no height or weight on file to calculate BMI.  General Appearance: UTA  Eye Contact:  UTA  Speech:  Clear and Coherent  Volume:  Normal  Mood:  Euthymic  Affect:  UTA  Thought Process:  Goal Directed  and Descriptions of Associations: Intact  Orientation:  Full (Time, Place, and Person)  Thought Content: Logical   Suicidal Thoughts:  No  Homicidal Thoughts:  No  Memory:  Immediate;   Fair Recent;   Fair Remote;   Fair  Judgement:  Fair  Insight:  Fair  Psychomotor Activity:  Normal  Concentration:  Concentration: Fair and Attention Span: Fair  Recall:  [employer] of Knowledge: Fair  Language: Fair  Akathisia:  No  Handed:  Right  AIMS (if indicated): Denies tremors, rigidity  Assets:  Communication Skills Desire for Improvement Housing  ADL's:  Intact  Cognition: WNL  Sleep:  Fair   Screenings: PHQ2-9     Office Visit from 05/20/2018 in [facility] Visit  from 12/11/2016 in [facility]  PHQ-2 Total Score  0  0  PHQ-9 Total Score  0  0       Assessment and Plan: Eric Hatfield is a 64 year old Caucasian male, married, employed, lives in [place], has a history of bipolar disorder in remission was evaluated by phone today.  Patient is currently doing well on the current medication regimen.  Plan as noted below.  Plan Bipolar disorder in remission Tegretol 400 mg p.o. twice daily Tegretol level on 02/24/2019-8.5-therapeutic.   For history of hyponatremia-patient with sodium level low, reviewed CMP-sodium level -10-2- 20.  Patient will continue to follow-up with his primary care provider.  Tobacco use disorder-we will continue to monitor closely.  Follow-up in clinic in 2 to 3 months or sooner if needed.  I have spent atleast 20 minutes non face to face with patient today. More than 50 % of the time was spent for obtaining and to review and separately obtained history , ordering medications and test ,psychoeducation and supportive psychotherapy and care coordination,as well as documenting clinical information in electronic health record,interpreting results of test and communication of results This note was generated in part or whole with voice  recognition software. Voice recognition is usually quite accurate but there are transcription errors that can and very often do occur. I apologize for any typographical errors that were not detected and corrected.       Ursula Alert, MD 10/10/2019, 5:05 PM

## 2019-10-16 ENCOUNTER — Other Ambulatory Visit: Payer: Self-pay | Admitting: Family Medicine

## 2019-10-16 ENCOUNTER — Other Ambulatory Visit: Payer: Self-pay | Admitting: Psychiatry

## 2019-10-16 ENCOUNTER — Telehealth: Payer: Self-pay

## 2019-10-16 DIAGNOSIS — I10 Essential (primary) hypertension: Secondary | ICD-10-CM

## 2019-10-16 DIAGNOSIS — F3176 Bipolar disorder, in full remission, most recent episode depressed: Secondary | ICD-10-CM

## 2019-10-16 NOTE — Telephone Encounter (Signed)
pt called left a message that he needs a refill on his medication.

## 2019-10-17 NOTE — Telephone Encounter (Signed)
Tegretol was sent to pharmacy on 10/16/2019

## 2020-01-01 ENCOUNTER — Ambulatory Visit (INDEPENDENT_AMBULATORY_CARE_PROVIDER_SITE_OTHER): Payer: BC Managed Care – PPO | Admitting: Psychiatry

## 2020-01-01 ENCOUNTER — Encounter: Payer: Self-pay | Admitting: Psychiatry

## 2020-01-01 ENCOUNTER — Other Ambulatory Visit: Payer: Self-pay

## 2020-01-01 DIAGNOSIS — F3176 Bipolar disorder, in full remission, most recent episode depressed: Secondary | ICD-10-CM | POA: Diagnosis not present

## 2020-01-01 DIAGNOSIS — F17200 Nicotine dependence, unspecified, uncomplicated: Secondary | ICD-10-CM

## 2020-01-01 NOTE — Progress Notes (Signed)
Provider Location : [facility] Patient Location : Home  Virtual Visit via Telephone Note  I connected with [name] on 01/01/20 at  4:30 PM EDT by telephone and verified that I am speaking with the correct person using two identifiers.   I discussed the limitations, risks, security and privacy concerns of performing an evaluation and management service by telephone and the availability of in person appointments. I also discussed with the patient that there may be a patient responsible charge related to this service. The patient expressed understanding and agreed to proceed.    I discussed the assessment and treatment plan with the patient. The patient was provided an opportunity to ask questions and all were answered. The patient agreed with the plan and demonstrated an understanding of the instructions.   The patient was advised to call back or seek an in-person evaluation if the symptoms worsen or if the condition fails to improve as anticipated.   BH MD OP Progress Note  01/01/2020 4:52 PM ERIEL DOYON  MRN:  [id number]  Chief Complaint:  Chief Complaint    Follow-up     HPI: Eric Hatfield is a 64 year old Caucasian male, married, lives in [place], has a history of bipolar disorder, was evaluated by phone today.  Patient preferred to do a phone call.  Patient today reports his mood symptoms are currently stable.  He denies any mood swings.  He is compliant on Tegretol.  He denies side effects.  He reports work continues to be going well.  He is staying busy.  Patient reports he has not gotten his COVID-19 vaccine yet however is contemplating on that.  Patient denies any suicidality, homicidality or perceptual disturbances.  Patient has not quit smoking yet.  He is noncompliant on Wellbutrin.  Provided smoking cessation counseling.  Patient denies any other concerns today. Visit Diagnosis:    ICD-10-CM   1. Bipolar disorder, in full remission, most recent episode depressed  (HCC)  F31.76   2. Tobacco use disorder  F17.200     Past Psychiatric History: I have reviewed past psychiatric history from my progress note on 02/28/2019  Past Medical History:  Past Medical History:  Diagnosis Date  . Hypertension     Past Surgical History:  Procedure Laterality Date  . Impacted tooth      Family Psychiatric History: I have reviewed family psychiatric history from my progress note on 02/28/2019  Family History:  Family History  Problem Relation Age of Onset  . Hypertension Mother   . Hyperlipidemia Father   . Hypertension Brother   . Mental illness Paternal Grandmother     Social History: Reviewed social history from my progress note on 02/28/2019 Social History   Socioeconomic History  . Marital status: Married    Spouse name: Not on file  . Number of children: Not on file  . Years of education: Not on file  . Highest education level: Not on file  Occupational History  . Not on file  Tobacco Use  . Smoking status: Current Every Day Smoker    Packs/day: 1.00    Years: 30.00    Pack years: 30.00    Types: Cigarettes    Start date: 05/31/1975  . Smokeless tobacco: Never Used  . Tobacco comment: started smoking as a teenager average 1 ppd  Substance and Sexual Activity  . Alcohol use: No    Alcohol/week: 0.0 standard drinks    Comment: quit drking in his 41s.   . Drug use: No  .  Sexual activity: Yes    Birth control/protection: None  Other Topics Concern  . Not on file  Social History Narrative  . Not on file   Social Determinants of Health   Financial Resource Strain:   . Difficulty of Paying Living Expenses:   Food Insecurity:   . Worried About Charity fundraiser in the Last Year:   . Arboriculturist in the Last Year:   Transportation Needs:   . Film/video editor (Medical):   Marland Kitchen Lack of Transportation (Non-Medical):   Physical Activity:   . Days of Exercise per Week:   . Minutes of Exercise per Session:   Stress:   . Feeling of  Stress :   Social Connections:   . Frequency of Communication with Friends and Family:   . Frequency of Social Gatherings with Friends and Family:   . Attends Religious Services:   . Active Member of Clubs or Organizations:   . Attends Archivist Meetings:   Marland Kitchen Marital Status:     Allergies: No Known Allergies  Metabolic Disorder Labs: Lab Results  Component Value Date   HGBA1C 5.8 12/24/2010   No results found for: PROLACTIN Lab Results  Component Value Date   CHOL 211 (H) 06/23/2019   TRIG 40 06/23/2019   HDL 82 06/23/2019   CHOLHDL 2.6 06/23/2019   VLDL 6 04/27/2015   LDLCALC 122 (H) 06/23/2019   LDLCALC 102 (H) 12/11/2016   Lab Results  Component Value Date   TSH 1.500 06/23/2019   TSH 1.160 12/11/2016    Therapeutic Level Labs: No results found for: LITHIUM No results found for: VALPROATE No components found for:  CBMZ  Current Medications: Current Outpatient Medications  Medication Sig Dispense Refill  . amLODipine (NORVASC) 10 MG tablet TAKE 1 TABLET BY MOUTH DAILY 30 tablet 5  . carbamazepine (TEGRETOL) 200 MG tablet TAKE TWO TABLETS IN THE MORNING AND TWO TABLETS IN THE EVENING 360 tablet 1   No current facility-administered medications for this visit.     Musculoskeletal: Strength & Muscle Tone: UTA Gait & Station: UTA Patient leans: N/A  Psychiatric Specialty Exam: Review of Systems  Psychiatric/Behavioral: Negative for agitation, behavioral problems, confusion, decreased concentration, dysphoric mood, hallucinations, self-injury, sleep disturbance and suicidal ideas. The patient is not nervous/anxious and is not hyperactive.   All other systems reviewed and are negative.   There were no vitals taken for this visit.There is no height or weight on file to calculate BMI.  General Appearance: UTA  Eye Contact:  UTA  Speech:  Clear and Coherent  Volume:  Normal  Mood:  Euthymic  Affect:  UTA  Thought Process:  Goal Directed and  Descriptions of Associations: Intact  Orientation:  Full (Time, Place, and Person)  Thought Content: Logical   Suicidal Thoughts:  No  Homicidal Thoughts:  No  Memory:  Immediate;   Fair Recent;   Fair Remote;   Fair  Judgement:  Fair  Insight:  Fair  Psychomotor Activity:  UTA  Concentration:  Concentration: Fair and Attention Span: Fair  Recall:  [employer] of Knowledge: Fair  Language: Fair  Akathisia:  No  Handed:  Right  AIMS (if indicated): UTA  Assets:  Communication Skills Desire for Improvement Housing Social Support  ADL's:  Intact  Cognition: WNL  Sleep:  Fair   Screenings: PHQ2-9     Office Visit from 05/20/2018 in [facility] Visit from 12/11/2016 in [facility]  PHQ-2 Total Score  0  0  PHQ-9 Total Score  0  0       Assessment and Plan: Quante is a 64 year old Caucasian male, married, employed, lives in [place], has a history of bipolar disorder in remission was evaluated by phone today.  Patient is currently stable on current medication regimen.  Plan as noted below.  Plan Bipolar disorder in remission Tegretol 400 mg p.o. twice daily Tegretol level on 02/24/2019-8.5-therapeutic Will order labs again when he returns for his next visit.  Tobacco use disorder-unstable Discontinue Wellbutrin for noncompliance. Discussed Chantix.  He will Pharmacist, hospital know. Discussed nicotine patches as well as gums and lozenges. Provided counseling.  Follow-up in clinic in 2 to 3 months or sooner if needed.  I have spent atleast 20 minutes non face to face with patient today. More than 50 % of the time was spent for preparing to see the patient ( e.g., review of test, records ),  ordering medications and test ,psychoeducation and supportive psychotherapy and care coordination,as well as documenting clinical information in electronic health record. This note was generated in part or whole with voice recognition software. Voice recognition  is usually quite accurate but there are transcription errors that can and very often do occur. I apologize for any typographical errors that were not detected and corrected.       Jomarie Longs, MD 01/01/2020, 4:52 PM

## 2020-03-19 ENCOUNTER — Encounter: Payer: Self-pay | Admitting: Psychiatry

## 2020-03-19 ENCOUNTER — Other Ambulatory Visit: Payer: Self-pay

## 2020-03-19 ENCOUNTER — Telehealth (INDEPENDENT_AMBULATORY_CARE_PROVIDER_SITE_OTHER): Payer: BC Managed Care – PPO | Admitting: Psychiatry

## 2020-03-19 DIAGNOSIS — F17200 Nicotine dependence, unspecified, uncomplicated: Secondary | ICD-10-CM

## 2020-03-19 DIAGNOSIS — G4700 Insomnia, unspecified: Secondary | ICD-10-CM

## 2020-03-19 DIAGNOSIS — F5105 Insomnia due to other mental disorder: Secondary | ICD-10-CM | POA: Insufficient documentation

## 2020-03-19 DIAGNOSIS — F3176 Bipolar disorder, in full remission, most recent episode depressed: Secondary | ICD-10-CM | POA: Diagnosis not present

## 2020-03-19 DIAGNOSIS — Z79899 Other long term (current) drug therapy: Secondary | ICD-10-CM | POA: Insufficient documentation

## 2020-03-19 MED ORDER — CARBAMAZEPINE 200 MG PO TABS: ORAL_TABLET | ORAL | 1 refills | Status: DC

## 2020-03-19 NOTE — Progress Notes (Signed)
Provider Location : [facility] Patient Location : Home  Virtual Visit via Telephone Note  I connected with Eric Hatfield on 03/19/20 at  4:30 PM EDT by telephone and verified that I am speaking with the correct person using two identifiers.   I discussed the limitations, risks, security and privacy concerns of performing an evaluation and management service by telephone and the availability of in person appointments. I also discussed with the patient that there may be a patient responsible charge related to this service. The patient expressed understanding and agreed to proceed.    I discussed the assessment and treatment plan with the patient. The patient was provided an opportunity to ask questions and all were answered. The patient agreed with the plan and demonstrated an understanding of the instructions.   The patient was advised to call back or seek an in-person evaluation if the symptoms worsen or if the condition fails to improve as anticipated.   BH MD OP Progress Note  03/19/2020 5:17 PM Eric Hatfield  MRN:  [id number]  Chief Complaint:  Chief Complaint    Follow-up     HPI: Eric Hatfield is a 64 year old Caucasian male, married, lives in [place], has a history of bipolar disorder was evaluated by phone today.  Patient preferred to do a phone call.  Patient is currently doing well with regard to his mood symptoms.  Patient however does report sleep issues.  He reports he wakes up early in the morning since he has to urinate.  This has been going on since the past few weeks.  He got some melatonin over-the-counter and wants to know if it is okay to take it.  He is compliant on medications.  He denies side effects.  Patient denies any suicidality, homicidality or perceptual disturbances.  He continues to smoke cigarettes.  Provided counseling.  Also discussed Chantix.  Patient will let writer know if he is willing to start it.  Discussed with patient that we will need  labs done since he is on Tegretol.  He agrees to go to [facility].   Visit Diagnosis:    ICD-10-CM   1. Bipolar disorder, in full remission, most recent episode depressed (HCC)  F31.76 carbamazepine (TEGRETOL) 200 MG tablet  2. Insomnia, unspecified type  G47.00   3. Tobacco use disorder  F17.200   4. High risk medication use  Z79.899 Carbamazepine level, total    Comprehensive metabolic panel    CBC With Differential    Past Psychiatric History: I have reviewed past psychiatric history from my progress note on 02/28/2019.  Past Medical History:  Past Medical History:  Diagnosis Date  . Hypertension     Past Surgical History:  Procedure Laterality Date  . Impacted tooth      Family Psychiatric History: I have reviewed family psychiatric history from my progress note on 02/28/2019.  Family History:  Family History  Problem Relation Age of Onset  . Hypertension Mother   . Hyperlipidemia Father   . Hypertension Brother   . Mental illness Paternal Grandmother     Social History: I have reviewed social history from my progress note on 02/28/2019. Social History   Socioeconomic History  . Marital status: Married    Spouse name: Not on file  . Number of children: Not on file  . Years of education: Not on file  . Highest education level: Not on file  Occupational History  . Not on file  Tobacco Use  . Smoking status: Current  Every Day Smoker    Packs/day: 1.00    Years: 30.00    Pack years: 30.00    Types: Cigarettes    Start date: 05/31/1975  . Smokeless tobacco: Never Used  . Tobacco comment: started smoking as a teenager average 1 ppd  Vaping Use  . Vaping Use: Never used  Substance and Sexual Activity  . Alcohol use: No    Alcohol/week: 0.0 standard drinks    Comment: quit drking in his 33s.   . Drug use: No  . Sexual activity: Yes    Birth control/protection: None  Other Topics Concern  . Not on file  Social History Narrative  . Not on file   Social  Determinants of Health   Financial Resource Strain:   . Difficulty of Paying Living Expenses:   Food Insecurity:   . Worried About Programme researcher, broadcasting/film/video in the Last Year:   . Barista in the Last Year:   Transportation Needs:   . Freight forwarder (Medical):   Marland Kitchen Lack of Transportation (Non-Medical):   Physical Activity:   . Days of Exercise per Week:   . Minutes of Exercise per Session:   Stress:   . Feeling of Stress :   Social Connections:   . Frequency of Communication with Friends and Family:   . Frequency of Social Gatherings with Friends and Family:   . Attends Religious Services:   . Active Member of Clubs or Organizations:   . Attends Banker Meetings:   Marland Kitchen Marital Status:     Allergies: No Known Allergies  Metabolic Disorder Labs: Lab Results  Component Value Date   HGBA1C 5.8 12/24/2010   No results found for: PROLACTIN Lab Results  Component Value Date   CHOL 211 (H) 06/23/2019   TRIG 40 06/23/2019   HDL 82 06/23/2019   CHOLHDL 2.6 06/23/2019   VLDL 6 04/27/2015   LDLCALC 122 (H) 06/23/2019   LDLCALC 102 (H) 12/11/2016   Lab Results  Component Value Date   TSH 1.500 06/23/2019   TSH 1.160 12/11/2016    Therapeutic Level Labs: No results found for: LITHIUM No results found for: VALPROATE No components found for:  CBMZ  Current Medications: Current Outpatient Medications  Medication Sig Dispense Refill  . amLODipine (NORVASC) 10 MG tablet TAKE 1 TABLET BY MOUTH DAILY 30 tablet 5  . carbamazepine (TEGRETOL) 200 MG tablet TAKE TWO TABLETS IN THE MORNING AND TWO TABLETS IN THE EVENING 360 tablet 1   No current facility-administered medications for this visit.     Musculoskeletal: Strength & Muscle Tone: UTA Gait & Station: UTA Patient leans: N/A  Psychiatric Specialty Exam: Review of Systems  Psychiatric/Behavioral: Positive for sleep disturbance. Negative for agitation, behavioral problems, confusion, decreased  concentration, dysphoric mood, hallucinations, self-injury and suicidal ideas. The patient is not nervous/anxious and is not hyperactive.   All other systems reviewed and are negative.   There were no vitals taken for this visit.There is no height or weight on file to calculate BMI.  General Appearance: UTA  Eye Contact:  UTA  Speech:  Clear and Coherent  Volume:  Normal  Mood:  Euthymic  Affect:  UTA  Thought Process:  Goal Directed and Descriptions of Associations: Intact  Orientation:  Full (Time, Place, and Person)  Thought Content: Logical   Suicidal Thoughts:  No  Homicidal Thoughts:  No  Memory:  Immediate;   Fair Recent;   Fair Remote;   Fair  Judgement:  Fair  Insight:  Fair  Psychomotor Activity:  UTA  Concentration:  Concentration: Fair and Attention Span: Fair  Recall:  [employer] of Knowledge: Fair  Language: Fair  Akathisia:  No  Handed:  Right  AIMS (if indicated): UTA  Assets:  Communication Skills Desire for Improvement Housing Social Support  ADL's:  Intact  Cognition: WNL  Sleep:  Poor   Screenings: PHQ2-9     [facility] Visit from 05/20/2018 in [facility] Office Visit from 12/11/2016 in [facility]  PHQ-2 Total Score 0 0  PHQ-9 Total Score 0 0       Assessment and Plan: Eric Hatfield is a 64 year old Caucasian male, married, employed, lives in [place], has a history of bipolar disorder in remission was evaluated by phone today.  Patient is stable on current medication regimen with regards to his mood however does struggle with sleep.  Plan as noted below.  Plan Bipolar disorder in remission Tegretol 400 mg p.o. twice daily Tegretol level on 02/24/2019-8.5-therapeutic. Will order labs today-Tegretol level, CMP, CBC with differential.  Insomnia-unstable Provided sleep hygiene techniques like switching of TV laptop forms couple of hours before bedtime, limiting caffeine alcohol use prior to bedtime, limiting fluid  intake prior to bedtime, keeping his thermostat at the comfortable setting. Start melatonin, he has tried10 mg p.o. nightly, which may have helped.  Will advise to continue the same.  Discussed with patient to reach out to writer if he has any problems with his sleep in spite of making these changes.  Tobacco use disorder-unstable Provided counseling.  High risk medication use-we will order Tegretol level, CMP, CBC with differential.  Patient wants lab order mailed to his mailing address and he will go to [facility] close to where he lives.  Follow-up in clinic in 3 months or sooner if needed.  I have spent atleast 20 minutes non face to face with patient today. More than 50 % of the time was spent for preparing to see the patient ( e.g., review of test, records ), ordering medications and test ,psychoeducation and supportive psychotherapy and care coordination,as well as documenting clinical information in electronic health record. This note was generated in part or whole with voice recognition software. Voice recognition is usually quite accurate but there are transcription errors that can and very often do occur. I apologize for any typographical errors that were not detected and corrected.      Jomarie Longs, MD 03/19/2020, 5:17 PM

## 2020-03-20 ENCOUNTER — Telehealth: Payer: Self-pay

## 2020-03-20 NOTE — Telephone Encounter (Signed)
labwork orders mailed  

## 2020-03-29 DIAGNOSIS — Z79899 Other long term (current) drug therapy: Secondary | ICD-10-CM | POA: Diagnosis not present

## 2020-03-30 LAB — COMPREHENSIVE METABOLIC PANEL
ALT: 19 IU/L (ref 0–44)
AST: 18 IU/L (ref 0–40)
Albumin/Globulin Ratio: 2.1 (ref 1.2–2.2)
Albumin: 4.5 g/dL (ref 3.8–4.8)
Alkaline Phosphatase: 129 IU/L — ABNORMAL HIGH (ref 48–121)
BUN/Creatinine Ratio: 10 (ref 10–24)
BUN: 8 mg/dL (ref 8–27)
Bilirubin Total: 0.3 mg/dL (ref 0.0–1.2)
CO2: 23 mmol/L (ref 20–29)
Calcium: 9.2 mg/dL (ref 8.6–10.2)
Chloride: 93 mmol/L — ABNORMAL LOW (ref 96–106)
Creatinine, Ser: 0.77 mg/dL (ref 0.76–1.27)
GFR calc Af Amer: 112 mL/min/{1.73_m2} (ref >59)
GFR calc non Af Amer: 97 mL/min/{1.73_m2} (ref >59)
Globulin, Total: 2.1 g/dL (ref 1.5–4.5)
Glucose: 116 mg/dL — ABNORMAL HIGH (ref 65–99)
Potassium: 4.8 mmol/L (ref 3.5–5.2)
Sodium: 131 mmol/L — ABNORMAL LOW (ref 134–144)
Total Protein: 6.6 g/dL (ref 6.0–8.5)

## 2020-03-30 LAB — CBC WITH DIFFERENTIAL
Basophils Absolute: 0.1 10*3/uL (ref 0.0–0.2)
Basos: 1 %
EOS (ABSOLUTE): 0.1 10*3/uL (ref 0.0–0.4)
Eos: 1 %
Hematocrit: 40.7 % (ref 37.5–51.0)
Hemoglobin: 14.3 g/dL (ref 13.0–17.7)
Immature Grans (Abs): 0 10*3/uL (ref 0.0–0.1)
Immature Granulocytes: 0 %
Lymphocytes Absolute: 1.1 10*3/uL (ref 0.7–3.1)
Lymphs: 17 %
MCH: 34 pg — ABNORMAL HIGH (ref 26.6–33.0)
MCHC: 35.1 g/dL (ref 31.5–35.7)
MCV: 97 fL (ref 79–97)
Monocytes Absolute: 0.8 10*3/uL (ref 0.1–0.9)
Monocytes: 12 %
Neutrophils Absolute: 4.6 10*3/uL (ref 1.4–7.0)
Neutrophils: 69 %
RBC: 4.21 x10E6/uL (ref 4.14–5.80)
RDW: 12.2 % (ref 11.6–15.4)
WBC: 6.7 10*3/uL (ref 3.4–10.8)

## 2020-04-01 NOTE — Progress Notes (Signed)
Please call patient and let him know that his Na level is mildly low but better as compare to las blood work about 9 months ago, and rest of labs appear stable. We are still waiting for Tegretol level to comeback.

## 2020-04-19 ENCOUNTER — Other Ambulatory Visit: Payer: Self-pay | Admitting: Family Medicine

## 2020-04-19 DIAGNOSIS — I10 Essential (primary) hypertension: Secondary | ICD-10-CM

## 2020-04-19 NOTE — Telephone Encounter (Signed)
Requested medication (s) are due for refill today: yes  Requested medication (s) are on the active medication list: yes  Last refill:  03/19/2019  Future visit scheduled: no  Notes to clinic:  overdue for follow up LOV-06/2019   Requested Prescriptions  Pending Prescriptions Disp Refills   amLODipine (NORVASC) 10 MG tablet [Pharmacy Med Name: AMLODIPINE BESYLATE 10 MG TAB] 30 tablet 5    Sig: TAKE 1 TABLET BY MOUTH DAILY      Cardiovascular:  Calcium Channel Blockers Failed - 04/19/2020 10:11 AM      Failed - Valid encounter within last 6 months    Recent Outpatient Visits           10 months ago Hyponatremia   [facility] Malva Limes, MD   1 year ago Essential hypertension   [facility] Malva Limes, MD   2 years ago Essential hypertension   [facility] Malva Limes, MD   3 years ago Hyponatremia   [facility] Malva Limes, MD   3 years ago Hyponatremia   [facility] Malva Limes, MD              Passed - Last BP in normal range    BP Readings from Last 1 Encounters:  06/23/19 138/72

## 2020-05-20 ENCOUNTER — Encounter: Payer: Self-pay | Admitting: Psychiatry

## 2020-05-20 ENCOUNTER — Other Ambulatory Visit: Payer: Self-pay

## 2020-05-20 ENCOUNTER — Telehealth (INDEPENDENT_AMBULATORY_CARE_PROVIDER_SITE_OTHER): Payer: BC Managed Care – PPO | Admitting: Psychiatry

## 2020-05-20 DIAGNOSIS — F17200 Nicotine dependence, unspecified, uncomplicated: Secondary | ICD-10-CM

## 2020-05-20 DIAGNOSIS — Z79899 Other long term (current) drug therapy: Secondary | ICD-10-CM

## 2020-05-20 DIAGNOSIS — G4700 Insomnia, unspecified: Secondary | ICD-10-CM | POA: Diagnosis not present

## 2020-05-20 DIAGNOSIS — F3176 Bipolar disorder, in full remission, most recent episode depressed: Secondary | ICD-10-CM | POA: Diagnosis not present

## 2020-05-20 NOTE — Progress Notes (Signed)
Provider Location : [facility] Patient Location : [place]  Participants: Patient , Provider  Virtual Visit via Telephone Note  I connected with Eric Hatfield on 05/20/20 at  4:40 PM EDT by telephone and verified that I am speaking with the correct person using two identifiers.   I discussed the limitations, risks, security and privacy concerns of performing an evaluation and management service by telephone and the availability of in person appointments. I also discussed with the patient that there may be a patient responsible charge related to this service. The patient expressed understanding and agreed to proceed.      I discussed the assessment and treatment plan with the patient. The patient was provided an opportunity to ask questions and all were answered. The patient agreed with the plan and demonstrated an understanding of the instructions.   The patient was advised to call back or seek an in-person evaluation if the symptoms worsen or if the condition fails to improve as anticipated.   BH MD OP Progress Note  05/20/2020 5:11 PM MICHELLE VANHISE  MRN:  [id number]  Chief Complaint:  Chief Complaint    Follow-up     HPI: Eric Hatfield is a 64 year old Caucasian male, married, lives in [place], has a history of bipolar disorder was evaluated by phone today.  Patient today reports he is currently doing well.  He denies any significant anxiety or mood swings.  He reports work is going well although stressful.  He is compliant on medications.  Denies side effects.  Patient continues to smoke cigarettes.  He will try to cut back.  Patient was unable to get Tegretol level done however discussed and reviewed CBC with differential and CMP with patient.  Discussed with him that we will order a Tegretol level today.  Patient denies any other concerns today.  Visit Diagnosis:    ICD-10-CM   1. Bipolar disorder, in full remission, most recent episode depressed (HCC)  F31.76    2. Insomnia, unspecified type  G47.00   3. Tobacco use disorder  F17.200   4. High risk medication use  Z79.899     Past Psychiatric History: I have reviewed past psychiatric history from my progress note on 02/28/2019  Past Medical History:  Past Medical History:  Diagnosis Date   Hypertension     Past Surgical History:  Procedure Laterality Date   Impacted tooth      Family Psychiatric History: Reviewed family psychiatric history from my progress note from 02/28/2019  Family History:  Family History  Problem Relation Age of Onset   Hypertension Mother    Hyperlipidemia Father    Hypertension Brother    Mental illness Paternal Grandmother     Social History: Reviewed social history from my progress note on 02/28/2019 Social History   Socioeconomic History   Marital status: Married    Spouse name: Not on file   Number of children: Not on file   Years of education: Not on file   Highest education level: Not on file  Occupational History   Not on file  Tobacco Use   Smoking status: Current Every Day Smoker    Packs/day: 1.00    Years: 30.00    Pack years: 30.00    Types: Cigarettes    Start date: 05/31/1975   Smokeless tobacco: Never Used   Tobacco comment: started smoking as a teenager average 1 ppd  Vaping Use   Vaping Use: Never used  Substance and Sexual Activity   Alcohol  use: No    Alcohol/week: 0.0 standard drinks    Comment: quit drking in his 56s.    Drug use: No   Sexual activity: Yes    Birth control/protection: None  Other Topics Concern   Not on file  Social History Narrative   Not on file   Social Determinants of Health   Financial Resource Strain:    Difficulty of Paying Living Expenses: Not on file  Food Insecurity:    Worried About Programme researcher, broadcasting/film/video in the Last Year: Not on file   [employer] of Food in the Last Year: Not on file  Transportation Needs:    Lack of Transportation (Medical): Not on file   Lack of  Transportation (Non-Medical): Not on file  Physical Activity:    Days of Exercise per Week: Not on file   Minutes of Exercise per Session: Not on file  Stress:    Feeling of Stress : Not on file  Social Connections:    Frequency of Communication with Friends and Family: Not on file   Frequency of Social Gatherings with Friends and Family: Not on file   Attends Religious Services: Not on file   Active Member of Clubs or Organizations: Not on file   Attends Banker Meetings: Not on file   Marital Status: Not on file    Allergies: No Known Allergies  Metabolic Disorder Labs: Lab Results  Component Value Date   HGBA1C 5.8 12/24/2010   No results found for: PROLACTIN Lab Results  Component Value Date   CHOL 211 (H) 06/23/2019   TRIG 40 06/23/2019   HDL 82 06/23/2019   CHOLHDL 2.6 06/23/2019   VLDL 6 04/27/2015   LDLCALC 122 (H) 06/23/2019   LDLCALC 102 (H) 12/11/2016   Lab Results  Component Value Date   TSH 1.500 06/23/2019   TSH 1.160 12/11/2016    Therapeutic Level Labs: No results found for: LITHIUM No results found for: VALPROATE No components found for:  CBMZ  Current Medications: Current Outpatient Medications  Medication Sig Dispense Refill   amLODipine (NORVASC) 10 MG tablet TAKE 1 TABLET BY MOUTH DAILY 30 tablet 0   carbamazepine (TEGRETOL) 200 MG tablet TAKE TWO TABLETS IN THE MORNING AND TWO TABLETS IN THE EVENING 360 tablet 1   No current facility-administered medications for this visit.     Musculoskeletal: Strength & Muscle Tone: UTA Gait & Station: UTA Patient leans: N/A  Psychiatric Specialty Exam: Review of Systems  Psychiatric/Behavioral: Negative for agitation, behavioral problems, confusion, decreased concentration, dysphoric mood, hallucinations, self-injury, sleep disturbance and suicidal ideas. The patient is not nervous/anxious and is not hyperactive.   All other systems reviewed and are negative.   There were  no vitals taken for this visit.There is no height or weight on file to calculate BMI.  General Appearance: UTA  Eye Contact:  UTA  Speech:  Clear and Coherent  Volume:  Normal  Mood:  Euthymic  Affect:  UTA  Thought Process:  Goal Directed and Descriptions of Associations: Intact  Orientation:  Full (Time, Place, and Person)  Thought Content: Logical   Suicidal Thoughts:  No  Homicidal Thoughts:  No  Memory:  Immediate;   Fair Recent;   Fair Remote;   Fair  Judgement:  Fair  Insight:  Fair  Psychomotor Activity:  UTA  Concentration:  Concentration: Fair and Attention Span: Fair  Recall:  [employer] of Knowledge: Fair  Language: Fair  Akathisia:  No  Handed:  Right  AIMS (if indicated): UTA  Assets:  Communication Skills Desire for Improvement Housing Social Support  ADL's:  Intact  Cognition: WNL  Sleep:  Fair   Screenings: PHQ2-9     Office Visit from 05/20/2018 in [facility] Office Visit from 12/11/2016 in [facility]  PHQ-2 Total Score 0 0  PHQ-9 Total Score 0 0       Assessment and Plan: Eric Hatfield is a 64 year old Caucasian male, married, employed, lives in [place], has a history of bipolar disorder in remission was evaluated by phone today.  Patient is currently stable on current medication regimen.  However he will need a Tegretol level done since it was not completed at last visit.  Plan as noted below.  Plan  Bipolar disorder in remission Tegretol 400 mg p.o. twice daily Tegretol level on 02/24/2019-8.5-therapeutic Tegretol level pending-will order again.  Patient agrees to go to the lab. I have reviewed CMP, CBC with differential dated 03/19/2020-within normal limits except for sodium which is low.  Patient does have a history of hyponatremia however it is uptrending.  He will continue to follow-up with his primary care provider.  Insomnia-stable Melatonin 10 mg p.o. nightly. Continue sleep hygiene  techniques.  Tobacco use disorder-unstable He will continue to cut back.  Will fax Tegretol level lab slip to [facility] again.  Follow-up in 3 months or sooner if needed.  I have spent atleast 20 minutes non face to face with patient today. More than 50 % of the time was spent for preparing to see the patient ( e.g., review of test, records ), ordering medications and test ,psychoeducation and supportive psychotherapy and care coordination,as well as documenting clinical information in electronic health record,interpreting results of test and communication of results. This note was generated in part or whole with voice recognition software. Voice recognition is usually quite accurate but there are transcription errors that can and very often do occur. I apologize for any typographical errors that were not detected and corrected.       Jomarie Longs, MD 05/21/2020, 8:15 AM

## 2020-05-21 ENCOUNTER — Other Ambulatory Visit: Payer: Self-pay | Admitting: Family Medicine

## 2020-05-21 ENCOUNTER — Telehealth: Payer: Self-pay

## 2020-05-21 DIAGNOSIS — I10 Essential (primary) hypertension: Secondary | ICD-10-CM

## 2020-05-21 NOTE — Telephone Encounter (Signed)
Requested medication (s) are due for refill today: yes  Requested medication (s) are on the active medication list: yes  Last refill: 04/19/2020  Future visit scheduled: no  Notes to clinic:  courtesy refill given and no follow up scheduled   Requested Prescriptions  Pending Prescriptions Disp Refills   amLODipine (NORVASC) 10 MG tablet [Pharmacy Med Name: AMLODIPINE BESYLATE 10 MG TAB] 30 tablet 0    Sig: TAKE 1 TABLET BY MOUTH DAILY      Cardiovascular:  Calcium Channel Blockers Failed - 05/21/2020 10:41 AM      Failed - Valid encounter within last 6 months    Recent Outpatient Visits           11 months ago Hyponatremia   [facility] Malva Limes, MD   2 years ago Essential hypertension   [facility] Malva Limes, MD   2 years ago Essential hypertension   [facility] Malva Limes, MD   3 years ago Hyponatremia   [facility] Malva Limes, MD   3 years ago Hyponatremia   [facility] Malva Limes, MD              Passed - Last BP in normal range    BP Readings from Last 1 Encounters:  06/23/19 138/72

## 2020-05-21 NOTE — Telephone Encounter (Signed)
faxed and confirmed labwork orders sent to [facility] at [facility]

## 2020-05-22 NOTE — Telephone Encounter (Signed)
Left detailed voice message for patient letting him know to contact office back to schedule an appt. KW

## 2020-05-29 NOTE — Telephone Encounter (Signed)
I called and advised patient that he is due for a follow up office visit before we could approve refills. Patient says he has a lot going on and he would call us back when he is able to schedule an appointment.

## 2020-06-28 ENCOUNTER — Other Ambulatory Visit: Payer: Self-pay

## 2020-06-28 ENCOUNTER — Ambulatory Visit: Payer: BC Managed Care – PPO | Admitting: Family Medicine

## 2020-06-28 ENCOUNTER — Encounter: Payer: Self-pay | Admitting: Family Medicine

## 2020-06-28 VITALS — BP 142/74 | HR 71 | Temp 98.5°F | Resp 16 | Ht 70.0 in | Wt 152.0 lb

## 2020-06-28 DIAGNOSIS — E871 Hypo-osmolality and hyponatremia: Secondary | ICD-10-CM

## 2020-06-28 DIAGNOSIS — M67431 Ganglion, right wrist: Secondary | ICD-10-CM

## 2020-06-28 DIAGNOSIS — I10 Essential (primary) hypertension: Secondary | ICD-10-CM

## 2020-06-28 DIAGNOSIS — Z23 Encounter for immunization: Secondary | ICD-10-CM | POA: Diagnosis not present

## 2020-06-28 DIAGNOSIS — R634 Abnormal weight loss: Secondary | ICD-10-CM

## 2020-06-28 DIAGNOSIS — F3176 Bipolar disorder, in full remission, most recent episode depressed: Secondary | ICD-10-CM

## 2020-06-28 DIAGNOSIS — F17210 Nicotine dependence, cigarettes, uncomplicated: Secondary | ICD-10-CM

## 2020-06-28 NOTE — Progress Notes (Signed)
I,April Miller,acting as a scribe for Mila Merry, MD.,have documented all relevant documentation on the behalf of Mila Merry, MD,as directed by  Mila Merry, MD while in the presence of Mila Merry, MD.   Established patient visit   Patient: Eric Hatfield   DOB: [birth year]   64 y.o. Male  MRN: [id number] Visit Date: 06/28/2020  Today's healthcare provider: Mila Merry, MD   Chief Complaint  Patient presents with  . Follow-up  . Hypertension   Subjective    HPI   Hypertension, follow-up  BP Readings from Last 3 Encounters:  06/28/20 (!) 156/80  06/23/19 138/72  05/20/18 132/79   Wt Readings from Last 3 Encounters:  06/28/20 152 lb (68.9 kg)  06/23/19 161 lb (73 kg)  05/20/18 164 lb (74.4 kg)     He was last seen for hypertension 1 years ago.  BP at that visit was 138/72. Management since that visit includes . He reports good compliance with treatment. He is not having side effects. none He is not exercising. He is adherent to low salt diet.   Outside blood pressures are not checking.  He does smoke.  Use of agents associated with hypertension: none.   --------------------------------------------------------------------  He also has painless mass volar aspect of right wrist.      Medications: Outpatient Medications Prior to Visit  Medication Sig  . amLODipine (NORVASC) 10 MG tablet TAKE 1 TABLET BY MOUTH DAILY  . carbamazepine (TEGRETOL) 200 MG tablet TAKE TWO TABLETS IN THE MORNING AND TWO TABLETS IN THE EVENING   No facility-administered medications prior to visit.    Review of Systems  Constitutional: Negative for appetite change, chills and fever.  Respiratory: Negative for chest tightness, shortness of breath and wheezing.   Cardiovascular: Negative for chest pain and palpitations.  Gastrointestinal: Negative for abdominal pain, nausea and vomiting.      Objective    BP (!) 156/80 (BP Location: Right Arm, Patient Position:  Sitting, Cuff Size: Large)   Pulse 71   Temp 98.5 F (36.9 C) (Oral)   Resp 16   Ht 5\' 10"  (1.778 m)   Wt 152 lb (68.9 kg)   SpO2 98%   BMI 21.81 kg/m    Physical Exam   General: Appearance:    Well developed, well nourished male in no acute distress  Eyes:    PERRL, conjunctiva/corneas clear, EOM's intact       Lungs:     Clear to auscultation bilaterally, respirations unlabored  Heart:    Normal heart rate. Normal rhythm.  2/6 systolic murmur  MS:   All extremities are intact. BB size ganglion cyst right wrist. Non tender, non  Inflamed.   Neurologic:   Awake, alert, oriented x 3. No apparent focal neurological           defect.         Assessment & Plan     1. Essential hypertension  - Comprehensive metabolic panel  2. Ganglion cyst of wrist, right Reassurance. Consider ortho referral if it becomes bothersome  3. Hyponatremia  - Comprehensive metabolic panel - TSH  4. Weight loss About 10 pounds since this time last year.  - CBC - TSH  5. Smoking greater than 30 pack years Encouraged smoking cessation. Encouraged LDCT screening which he declined today.   6. Need for influenza vaccination  - Flu Vaccine QUAD High Dose(Fluad)  7. Bipolar disorder, in full remission, most recent episode depressed (HCC) Followed by Dr. ,  is due to check- Carbamazepine Level (Tegretol), total        The entirety of the information documented in the History of Present Illness, Review of Systems and Physical Exam were personally obtained by me. Portions of this information were initially documented by the CMA and reviewed by me for thoroughness and accuracy.      Mila Merry, MD  [facility] [phone] (phone) [phone] (fax)  [facility]

## 2020-06-28 NOTE — Patient Instructions (Addendum)
.   Please review the attached list of medications and notify my office if there are any errors.   . Please bring all of your medications to every appointment so we can make sure that our medication list is the same as yours.   Marland Kitchen .Please go to the lab draw station in [address] on the second floor of [facility] . Normal hours are 8:00am to 12:30pm and 1:30pm to 4:00pm Monday through Friday    . Covid-19 vaccines: The Covid vaccines have been given to hundreds of millions of people and found to be very effective and are as safe as any other vaccine.  The [employer] vaccine has been associated with very rare dangerous blood clots, but only in adult women under the age of 100.  The risk of dying from Covid infections is much higher than having a serious reaction to the vaccine.  I strongly recommend getting fully vaccinated against Covid-19.  I recommend that adult women under 60 get fully vaccinated, but the [place] and [employer] vaccines may be safer for those women than the [employer] vaccine.

## 2020-07-19 DIAGNOSIS — R634 Abnormal weight loss: Secondary | ICD-10-CM | POA: Diagnosis not present

## 2020-07-19 DIAGNOSIS — F3176 Bipolar disorder, in full remission, most recent episode depressed: Secondary | ICD-10-CM | POA: Diagnosis not present

## 2020-07-19 DIAGNOSIS — I10 Essential (primary) hypertension: Secondary | ICD-10-CM | POA: Diagnosis not present

## 2020-07-19 DIAGNOSIS — E871 Hypo-osmolality and hyponatremia: Secondary | ICD-10-CM | POA: Diagnosis not present

## 2020-07-20 LAB — CBC
Hematocrit: 39.4 % (ref 37.5–51.0)
Hemoglobin: 14.2 g/dL (ref 13.0–17.7)
MCH: 34.1 pg — ABNORMAL HIGH (ref 26.6–33.0)
MCHC: 36 g/dL — ABNORMAL HIGH (ref 31.5–35.7)
MCV: 95 fL (ref 79–97)
Platelets: 268 10*3/uL (ref 150–450)
RBC: 4.16 x10E6/uL (ref 4.14–5.80)
RDW: 11.9 % (ref 11.6–15.4)
WBC: 6.6 10*3/uL (ref 3.4–10.8)

## 2020-07-20 LAB — COMPREHENSIVE METABOLIC PANEL
ALT: 23 IU/L (ref 0–44)
AST: 20 IU/L (ref 0–40)
Albumin/Globulin Ratio: 2 (ref 1.2–2.2)
Albumin: 4.3 g/dL (ref 3.8–4.8)
Alkaline Phosphatase: 132 IU/L — ABNORMAL HIGH (ref 44–121)
BUN/Creatinine Ratio: 14 (ref 10–24)
BUN: 12 mg/dL (ref 8–27)
Bilirubin Total: 0.3 mg/dL (ref 0.0–1.2)
CO2: 21 mmol/L (ref 20–29)
Calcium: 8.9 mg/dL (ref 8.6–10.2)
Chloride: 92 mmol/L — ABNORMAL LOW (ref 96–106)
Creatinine, Ser: 0.88 mg/dL (ref 0.76–1.27)
GFR calc Af Amer: 106 mL/min/{1.73_m2} (ref >59)
GFR calc non Af Amer: 91 mL/min/{1.73_m2} (ref >59)
Globulin, Total: 2.2 g/dL (ref 1.5–4.5)
Glucose: 103 mg/dL — ABNORMAL HIGH (ref 65–99)
Potassium: 4.6 mmol/L (ref 3.5–5.2)
Sodium: 128 mmol/L — ABNORMAL LOW (ref 134–144)
Total Protein: 6.5 g/dL (ref 6.0–8.5)

## 2020-07-20 LAB — CARBAMAZEPINE LEVEL, TOTAL: Carbamazepine (Tegretol), S: 9.8 ug/mL (ref 4.0–12.0)

## 2020-07-20 LAB — TSH: TSH: 0.896 u[IU]/mL (ref 0.450–4.500)

## 2020-07-23 ENCOUNTER — Telehealth: Payer: Self-pay

## 2020-07-23 NOTE — Telephone Encounter (Signed)
Claretha Cooper, RN  07/23/2020 12:38 PM EDT Back to Top    Patient returned call and was read lab note by Dr Sherrie Mustache written 07/20/20. He verbalized understanding. He is declining lung CT at this time. He will consider in future but not now.

## 2020-07-23 NOTE — Telephone Encounter (Signed)
-----   Message from Malva Limes, MD sent at 07/20/2020  8:56 AM EDT ----- Sodium is low, about at his baseline. Rest of labs are normal. Recommend lung CT for lung tumor screening due to long smoking history.

## 2020-07-23 NOTE — Telephone Encounter (Signed)
Tried calling patient. Left message to call back. OK for [facility] triage to advise and rout back to office with patients response regarding lung CT scan.

## 2020-07-26 ENCOUNTER — Other Ambulatory Visit: Payer: Self-pay | Admitting: Family Medicine

## 2020-07-26 DIAGNOSIS — I10 Essential (primary) hypertension: Secondary | ICD-10-CM

## 2020-08-21 ENCOUNTER — Telehealth (INDEPENDENT_AMBULATORY_CARE_PROVIDER_SITE_OTHER): Payer: BC Managed Care – PPO | Admitting: Psychiatry

## 2020-08-21 ENCOUNTER — Encounter: Payer: Self-pay | Admitting: Psychiatry

## 2020-08-21 ENCOUNTER — Other Ambulatory Visit: Payer: Self-pay

## 2020-08-21 ENCOUNTER — Telehealth: Payer: BC Managed Care – PPO | Admitting: Psychiatry

## 2020-08-21 DIAGNOSIS — F5105 Insomnia due to other mental disorder: Secondary | ICD-10-CM

## 2020-08-21 DIAGNOSIS — F17200 Nicotine dependence, unspecified, uncomplicated: Secondary | ICD-10-CM

## 2020-08-21 DIAGNOSIS — F3176 Bipolar disorder, in full remission, most recent episode depressed: Secondary | ICD-10-CM

## 2020-08-21 DIAGNOSIS — G4700 Insomnia, unspecified: Secondary | ICD-10-CM

## 2020-08-21 MED ORDER — CARBAMAZEPINE 200 MG PO TABS: ORAL_TABLET | ORAL | 1 refills | Status: DC

## 2020-08-21 NOTE — Progress Notes (Signed)
Virtual Visit via Telephone Note  I connected with Eric Hatfield on 08/21/20 at  4:30 PM EST by telephone and verified that I am speaking with the correct person using two identifiers.  Location Provider Location : [facility] Patient Location : Home  Participants: Patient , Provider   I discussed the limitations, risks, security and privacy concerns of performing an evaluation and management service by telephone and the availability of in person appointments. I also discussed with the patient that there may be a patient responsible charge related to this service. The patient expressed understanding and agreed to proceed.   I discussed the assessment and treatment plan with the patient. The patient was provided an opportunity to ask questions and all were answered. The patient agreed with the plan and demonstrated an understanding of the instructions.   The patient was advised to call back or seek an in-person evaluation if the symptoms worsen or if the condition fails to improve as anticipated.   BH MD OP Progress Note  08/21/2020 4:46 PM Eric Hatfield  MRN:  [id number]  Chief Complaint:  Chief Complaint    Follow-up     HPI: Eric Hatfield is a 64 year old Caucasian male, married, lives in [place], has a history of bipolar disorder was evaluated by phone today.  Patient today reports he had a good Thanksgiving holiday with his family.  Patient reports he continues to do well on the current dosage of Tegretol.  He denies side effects.  Denies mood swings, appetite changes, sleep problems, perceptual disturbances.  Denies suicidality or homicidality.  Patient reports he had recent lab work-up done and he got his Tegretol level as well as CBC and CMP.  Reviewed and discussed the same with patient.  Discussed reducing the dosage of Tegretol since his symptoms has been stable since the past several years.  Patient reports he will let writer know if he is interested and wants to  discuss it with his family first.    Visit Diagnosis:    ICD-10-CM   1. Bipolar disorder, in full remission, most recent episode depressed (HCC)  F31.76 carbamazepine (TEGRETOL) 200 MG tablet  2. Insomnia due to mental disorder  F51.05   3. Tobacco use disorder  F17.200     Past Psychiatric History: I have reviewed past psychiatric history from my progress note on 02/28/2019  Past Medical History:  Past Medical History:  Diagnosis Date  . Hypertension     Past Surgical History:  Procedure Laterality Date  . Impacted tooth      Family Psychiatric History: I have reviewed family psychiatric history from my progress note on 02/28/2019  Family History:  Family History  Problem Relation Age of Onset  . Hypertension Mother   . Hyperlipidemia Father   . Hypertension Brother   . Mental illness Paternal Grandmother     Social History: I have reviewed social history from my progress note from 02/28/2019 Social History   Socioeconomic History  . Marital status: Married    Spouse name: Not on file  . Number of children: Not on file  . Years of education: Not on file  . Highest education level: Not on file  Occupational History  . Not on file  Tobacco Use  . Smoking status: Current Every Day Smoker    Packs/day: 1.00    Years: 30.00    Pack years: 30.00    Types: Cigarettes    Start date: 05/31/1975  . Smokeless tobacco: Never Used  .  Tobacco comment: started smoking as a teenager average 1 ppd  Vaping Use  . Vaping Use: Never used  Substance and Sexual Activity  . Alcohol use: No    Alcohol/week: 0.0 standard drinks    Comment: quit drking in his 50s.   . Drug use: No  . Sexual activity: Yes    Birth control/protection: None  Other Topics Concern  . Not on file  Social History Narrative  . Not on file   Social Determinants of Health   Financial Resource Strain:   . Difficulty of Paying Living Expenses: Not on file  Food Insecurity:   . Worried About Brewing technologist in the Last Year: Not on file  . Ran Out of Food in the Last Year: Not on file  Transportation Needs:   . Lack of Transportation (Medical): Not on file  . Lack of Transportation (Non-Medical): Not on file  Physical Activity:   . Days of Exercise per Week: Not on file  . Minutes of Exercise per Session: Not on file  Stress:   . Feeling of Stress : Not on file  Social Connections:   . Frequency of Communication with Friends and Family: Not on file  . Frequency of Social Gatherings with Friends and Family: Not on file  . Attends Religious Services: Not on file  . Active Member of Clubs or Organizations: Not on file  . Attends Banker Meetings: Not on file  . Marital Status: Not on file    Allergies: No Known Allergies  Metabolic Disorder Labs: Lab Results  Component Value Date   HGBA1C 5.8 12/24/2010   No results found for: PROLACTIN Lab Results  Component Value Date   CHOL 211 (H) 06/23/2019   TRIG 40 06/23/2019   HDL 82 06/23/2019   CHOLHDL 2.6 06/23/2019   VLDL 6 04/27/2015   LDLCALC 122 (H) 06/23/2019   LDLCALC 102 (H) 12/11/2016   Lab Results  Component Value Date   TSH 0.896 07/19/2020   TSH 1.500 06/23/2019    Therapeutic Level Labs: No results found for: LITHIUM No results found for: VALPROATE No components found for:  CBMZ  Current Medications: Current Outpatient Medications  Medication Sig Dispense Refill  . amLODipine (NORVASC) 10 MG tablet TAKE ONE TABLET BY MOUTH EVERY DAY NEED APPOINTMENT FOR REFILLS 30 tablet 0  . carbamazepine (TEGRETOL) 200 MG tablet TAKE TWO TABLETS IN THE MORNING AND TWO TABLETS IN THE EVENING 360 tablet 1   No current facility-administered medications for this visit.     Musculoskeletal: Strength & Muscle Tone: UTA Gait & Station: UTA Patient leans: N/A  Psychiatric Specialty Exam: Review of Systems  Psychiatric/Behavioral: Positive for sleep disturbance (Some nights , overall ok).  All other systems  reviewed and are negative.   There were no vitals taken for this visit.There is no height or weight on file to calculate BMI.  General Appearance: UTA  Eye Contact:  UTA  Speech:  Clear and Coherent  Volume:  Normal  Mood:  Euthymic  Affect:  UTA  Thought Process:  Goal Directed and Descriptions of Associations: Intact  Orientation:  Full (Time, Place, and Person)  Thought Content: Logical   Suicidal Thoughts:  No  Homicidal Thoughts:  No  Memory:  Immediate;   Fair Recent;   Fair Remote;   Poor  Judgement:  Fair  Insight:  Fair  Psychomotor Activity:  UTA  Concentration:  Concentration: Fair and Attention Span: Fair  Recall:  Fair  Fund of Knowledge: Fair  Language: Fair  Akathisia:  No  Handed:  Right  AIMS (if indicated): UTA  Assets:  Communication Skills Desire for Improvement Housing Social Support  ADL's:  Intact  Cognition: WNL  Sleep:  Restless at times, overall OK   Screenings: PHQ2-9     Office Visit from 05/20/2018 in [facility] Office Visit from 12/11/2016 in [facility]  PHQ-2 Total Score 0 0  PHQ-9 Total Score 0 0       Assessment and Plan: PILAR CORRALES is a 64 year old Caucasian male, married, employed, lives in [place], has a history of bipolar disorder in remission was evaluated by phone today.  Patient is currently stable on current medication regimen.  Plan as noted below.  Plan Bipolar disorder in remission Tegretol 400 mg p.o. twice daily I have reviewed the following labs and discussed with patient-dated 07/19/2020-CMP-sodium reduced at 128, chloride low at 92-however chronic-he will continue to follow-up with his primary care provider, Tegretol level-9.8-therapeutic, CBC-within normal limits except for MCV elevated at 34.1-chronic, TSH-within normal limits at 0.896  Insomnia-stable Continue melatonin 10 mg p.o. nightly as needed  Tobacco use disorder-unstable Provided counseling.  He is not ready to cut  back.  Provided information for  quit now program.  Discussed tapering off carbamazepine since his symptoms has been stable for a while, he will let writer know if he is ready.  Follow-up in clinic in 3 months or sooner if needed.  I have spent atleast 20 minutes non face to face with patient today. More than 50 % of the time was spent for preparing to see the patient ( e.g., review of test, records ), ordering medications and test ,psychoeducation and supportive psychotherapy and care coordination,as well as documenting clinical information in electronic health record,interpreting and communication of test results This note was generated in part or whole with voice recognition software. Voice recognition is usually quite accurate but there are transcription errors that can and very often do occur. I apologize for any typographical errors that were not detected and corrected.        Eric Longs, MD 08/22/2020, 8:31 AM

## 2020-08-27 ENCOUNTER — Other Ambulatory Visit: Payer: Self-pay | Admitting: Family Medicine

## 2020-08-27 DIAGNOSIS — I10 Essential (primary) hypertension: Secondary | ICD-10-CM

## 2020-09-30 ENCOUNTER — Other Ambulatory Visit: Payer: Self-pay | Admitting: Family Medicine

## 2020-09-30 DIAGNOSIS — I10 Essential (primary) hypertension: Secondary | ICD-10-CM

## 2020-09-30 NOTE — Telephone Encounter (Signed)
Requested medications are due for refill today.  yes  Requested medications are on the active medications list.  yes  Last refill. 08/27/2020 #30 no refills.  Future visit scheduled.   no  Notes to clinic.   Called and [facility] for pt to call regarding this RX refill and need for appointment. Pt already given courtesy refill. Please advise.

## 2020-11-01 ENCOUNTER — Other Ambulatory Visit: Payer: Self-pay | Admitting: Family Medicine

## 2020-11-01 ENCOUNTER — Telehealth: Payer: Self-pay | Admitting: *Deleted

## 2020-11-01 DIAGNOSIS — I10 Essential (primary) hypertension: Secondary | ICD-10-CM

## 2020-11-01 MED ORDER — AMLODIPINE BESYLATE 10 MG PO TABS: 10.0000 mg | ORAL_TABLET | Freq: Every day | ORAL | 2 refills | Status: DC

## 2020-11-01 NOTE — Telephone Encounter (Signed)
LMTCB 11/01/2020.  PEC please advise pt as below when he calls back.   Thanks,   -Vernona Rieger

## 2020-11-01 NOTE — Telephone Encounter (Signed)
Requested medication (s) are due for refill today: yes  Requested medication (s) are on the active medication list: yes Last refill:  10/02/2020  Future visit scheduled: no  Notes to clinic: Patient given courtesy refill and has been advised to schedule follow up

## 2020-11-01 NOTE — Telephone Encounter (Signed)
Have sent a few months worth of refills, but he does not to be seen within the next few months to follow up up on BP and hyponatremia. He can either come in next week or move out to mid April, but definitely need to see him before the end of April.

## 2020-11-01 NOTE — Telephone Encounter (Signed)
Copied from CRM [id number]. Topic: General - Inquiry >> Nov 01, 2020 11:39 AM Pawlus, Maxine Glenn A wrote: Reason for CRM: PT scheduled a virtual visit with Dr Sherrie Mustache for 2/18 but wants to speak with him because he does not understand why he needs an appt to refill his medication when he was just seen 4 months ago. PT seemed frustrated and stated he only has 2 pills left. Please advise.

## 2020-11-04 NOTE — Telephone Encounter (Signed)
Tried calling patient. Left message to call back. OK for [facility] triage to advise.  ?

## 2020-11-05 NOTE — Telephone Encounter (Signed)
Patient's wife Chip Boer advised. She is going to have patient call back to reschedule the appointment for some time in later March or early April.

## 2020-11-08 ENCOUNTER — Telehealth: Payer: BC Managed Care – PPO | Admitting: Family Medicine

## 2020-12-10 ENCOUNTER — Telehealth: Payer: Self-pay

## 2020-12-10 ENCOUNTER — Other Ambulatory Visit: Payer: Self-pay

## 2020-12-10 ENCOUNTER — Ambulatory Visit (INDEPENDENT_AMBULATORY_CARE_PROVIDER_SITE_OTHER): Payer: BC Managed Care – PPO | Admitting: Psychiatry

## 2020-12-10 ENCOUNTER — Encounter: Payer: Self-pay | Admitting: Psychiatry

## 2020-12-10 VITALS — BP 163/90 | HR 80 | Temp 97.7°F | Wt 152.6 lb

## 2020-12-10 DIAGNOSIS — F3176 Bipolar disorder, in full remission, most recent episode depressed: Secondary | ICD-10-CM | POA: Diagnosis not present

## 2020-12-10 DIAGNOSIS — F5105 Insomnia due to other mental disorder: Secondary | ICD-10-CM

## 2020-12-10 DIAGNOSIS — F17200 Nicotine dependence, unspecified, uncomplicated: Secondary | ICD-10-CM

## 2020-12-10 DIAGNOSIS — R030 Elevated blood-pressure reading, without diagnosis of hypertension: Secondary | ICD-10-CM

## 2020-12-10 MED ORDER — CARBAMAZEPINE 200 MG PO TABS: 600.0000 mg | ORAL_TABLET | ORAL | 1 refills | Status: DC

## 2020-12-10 NOTE — Telephone Encounter (Signed)
dr. Sherrie Mustache patient PCP was faxed the blood pressure reading that pt had during todays visit.

## 2020-12-10 NOTE — Telephone Encounter (Signed)
Thank you-patient will need evaluation for blood pressure reading elevated

## 2020-12-10 NOTE — Progress Notes (Signed)
BH MD OP Progress Note  12/10/2020 6:05 PM Eric Hatfield  MRN:  [id number]  Chief Complaint:  Chief Complaint    Follow-up; Anxiety; Insomnia     HPI: Eric Hatfield is a 65 year old Caucasian male, married, lives in [place], has a history of bipolar disorder, insomnia, tobacco use disorder was evaluated in office today.  Patient today reports he is doing overall okay with regards to his mood.  He has been very busy with work.  He reports his appetite is fair.  In spite of that he could be losing weight.  He does not know why.  He agrees to get in touch with his primary care provider.  Patient reports sleep as restless.  He reports he wakes up at night due to outside noises from cars.  He tried earplugs that does not help.  He reports he takes the melatonin which used to help however his sleep has been restless since the past few weeks.  He is not interested in adding another medication at this time and does not want any changes.  He will Pharmacist, hospital know.  Patient continues to be compliant on the Tegretol.    Patient denies any suicidality, homicidality or perceptual disturbances.  Patient denies any other concerns today.  Visit Diagnosis:    ICD-10-CM   1. Bipolar disorder, in full remission, most recent episode depressed (HCC)  F31.76 carbamazepine (TEGRETOL) 200 MG tablet  2. Insomnia due to mental disorder  F51.05   3. Tobacco use disorder  F17.200   4. Elevated blood pressure reading  R03.0     Past Psychiatric History: I have reviewed past psychiatric history from my progress note on 02/28/2019  Past Medical History:  Past Medical History:  Diagnosis Date  . Hypertension     Past Surgical History:  Procedure Laterality Date  . Impacted tooth      Family Psychiatric History: I have reviewed family psychiatric history from my progress note on 02/28/2019  Family History:  Family History  Problem Relation Age of Onset  . Hypertension Mother   . Hyperlipidemia  Father   . Hypertension Brother   . Mental illness Paternal Grandmother     Social History: I have reviewed social history from my progress note on 02/28/2019 Social History   Socioeconomic History  . Marital status: Married    Spouse name: Not on file  . Number of children: Not on file  . Years of education: Not on file  . Highest education level: Not on file  Occupational History  . Not on file  Tobacco Use  . Smoking status: Current Every Day Smoker    Packs/day: 1.00    Years: 30.00    Pack years: 30.00    Types: Cigarettes    Start date: 05/31/1975  . Smokeless tobacco: Never Used  . Tobacco comment: started smoking as a teenager average 1 ppd  Vaping Use  . Vaping Use: Never used  Substance and Sexual Activity  . Alcohol use: No    Alcohol/week: 0.0 standard drinks    Comment: quit drking in his 55s.   . Drug use: No  . Sexual activity: Yes    Birth control/protection: None  Other Topics Concern  . Not on file  Social History Narrative  . Not on file   Social Determinants of Health   Financial Resource Strain: Not on file  Food Insecurity: Not on file  Transportation Needs: Not on file  Physical Activity: Not on file  Stress: Not on file  Social Connections: Not on file    Allergies: No Known Allergies  Metabolic Disorder Labs: Lab Results  Component Value Date   HGBA1C 5.8 12/24/2010   No results found for: PROLACTIN Lab Results  Component Value Date   CHOL 211 (H) 06/23/2019   TRIG 40 06/23/2019   HDL 82 06/23/2019   CHOLHDL 2.6 06/23/2019   VLDL 6 04/27/2015   LDLCALC 122 (H) 06/23/2019   LDLCALC 102 (H) 12/11/2016   Lab Results  Component Value Date   TSH 0.896 07/19/2020   TSH 1.500 06/23/2019    Therapeutic Level Labs: No results found for: LITHIUM No results found for: VALPROATE No components found for:  CBMZ  Current Medications: Current Outpatient Medications  Medication Sig Dispense Refill  . amLODipine (NORVASC) 10 MG  tablet Take 1 tablet (10 mg total) by mouth daily. 30 tablet 2  . carbamazepine (TEGRETOL) 200 MG tablet Take 3 tablets (600 mg total) by mouth as directed. TAKE ONE TABLETS IN THE MORNING AND TWO TABLETS IN THE EVENING 360 tablet 1   No current facility-administered medications for this visit.     Musculoskeletal: Strength & Muscle Tone: UTA Gait & Station: normal Patient leans: N/A  Psychiatric Specialty Exam: Review of Systems  Psychiatric/Behavioral: Positive for sleep disturbance.  All other systems reviewed and are negative.   Blood pressure (!) 163/90, pulse 80, temperature 97.7 F (36.5 C), temperature source Temporal, weight 152 lb 9.6 oz (69.2 kg).Body mass index is 21.9 kg/m.  General Appearance: Casual  Eye Contact:  Fair  Speech:  Clear and Coherent  Volume:  Normal  Mood:  Euthymic  Affect:  Congruent  Thought Process:  Goal Directed and Descriptions of Associations: Intact  Orientation:  Full (Time, Place, and Person)  Thought Content: Logical   Suicidal Thoughts:  No  Homicidal Thoughts:  No  Memory:  Immediate;   Fair Recent;   Fair Remote;   Fair  Judgement:  Fair  Insight:  Fair  Psychomotor Activity:  Normal  Concentration:  Concentration: Fair and Attention Span: Fair  Recall:  [employer] of Knowledge: Fair  Language: Fair  Akathisia:  No  Handed:  Right  AIMS (if indicated): done  Assets:  Communication Skills Desire for Improvement Housing Intimacy Leisure Time Physical Health Resilience Social Support Talents/Skills Transportation Vocational/Educational  ADL's:  Intact  Cognition: WNL  Sleep:  Restless   Screenings: Secondary school teacher Row Office Visit from 12/10/2020 in [facility] Office Visit from 05/20/2018 in [facility] Office Visit from 12/11/2016 in [facility]  PHQ-2 Total Score 1 0 0  PHQ-9 Total Score -- 0 0    Flowsheet Row Office Visit from 12/10/2020 in [facility]  C-SSRS RISK CATEGORY No Risk       Assessment and Plan: Eric Hatfield is a 65 year old Caucasian male, married, employed, lives in [place], has a history of bipolar disorder in remission was evaluated in office today.  Discussed plan as noted below.  Plan Bipolar disorder in remission Discussed with patient to reduce his Tegretol to 200 mg p.o. daily in the morning and 400 mg every afternoon. Patient advised to monitor his symptoms closely and if he notices any worsening mood symptoms he could go back to his previous dosage of Tegretol 400 mg p.o. twice daily Most recent Tegretol level-07/19/2020-9.8-therapeutic Patient with history of hyponatremia-advised to follow-up with primary care provider.  Insomnia-restless Patient is not  interested in medication changes today. Continue melatonin 10 mg p.o. nightly as needed  Tobacco use disorder-unstable We will monitor closely  Elevated blood pressure reading today-provided education.  Advised patient to get in touch with his primary care provider for medication management.  Follow-up in clinic in 6 to 8 weeks or sooner.  This note was generated in part or whole with voice recognition software. Voice recognition is usually quite accurate but there are transcription errors that can and very often do occur. I apologize for any typographical errors that were not detected and corrected.      Jomarie Longs, MD 12/11/2020, 8:28 AM

## 2020-12-11 ENCOUNTER — Telehealth: Payer: Self-pay | Admitting: Family Medicine

## 2020-12-11 DIAGNOSIS — I10 Essential (primary) hypertension: Secondary | ICD-10-CM

## 2020-12-11 MED ORDER — VALSARTAN 80 MG PO TABS: 80.0000 mg | ORAL_TABLET | Freq: Every day | ORAL | 0 refills | Status: DC

## 2020-12-11 NOTE — Telephone Encounter (Signed)
He's already taking highest dose of amlodipine. Continue taking it and ADD valsartan 80mg  one tablet daily, #30, no refill and follow up in April as scheduled.

## 2020-12-11 NOTE — Telephone Encounter (Signed)
Pt called back in for assistance. Pt says that he has a concern about the new dosage amount that he has been prescribed (Valsartan) pt says that his spouse feels that the mg is high. Pt would like to discuss further.

## 2020-12-11 NOTE — Telephone Encounter (Signed)
Patient advised and verbalized understanding. Prescription sent into pharmacy.  

## 2020-12-11 NOTE — Telephone Encounter (Signed)
I called and spoke with patient. He says he was seen yesterday by Behavioral health and his blood pressure was 163/90. Patient was told to follow up with his PCP about his elevated blood pressure reading. Patient has an appointment to follow up in our office on 12/27/2020. He wants to know if you want to see him sooner or whether you prefer to increase his medication now? Patient currently takes Amlodipine 10mg  daily. He denies missing any doses of his medication. Patient denies any chest pain, blurred vision, numbness or headaches.  [facility]

## 2020-12-11 NOTE — Telephone Encounter (Signed)
Pts wife called and is requesting to have the pts BP medication, Amlodipine, increased. She states that the pt is still having elevated BP according to the appt he had yesterday. She is requesting to have a call back to go over this. Please advise.      [facility] - [place], [place] - [address]  Renee Harder [place] [address]  Phone: [phone] Fax: [phone]  Hours: Not open 24 hours

## 2020-12-12 NOTE — Telephone Encounter (Signed)
Patient feels that the 80mg  dose of Valsartan is too high of a dose to take. He says the Amlodipine dose is much lower; only 10mg .  Please advise.

## 2020-12-13 NOTE — Telephone Encounter (Signed)
Tried calling patient. Left message to call back. OK for [facility] triage to advise.  ?

## 2020-12-13 NOTE — Telephone Encounter (Signed)
Patient returned call and was notified of PCP response- he is satisfied and has been advised to call back with any other concerns.

## 2020-12-13 NOTE — Telephone Encounter (Signed)
They are completely different medications with completely different dosing ranges. The dosing range for valsartan is 80-360mg  so 80 is a pretty low dose for that medication

## 2020-12-27 ENCOUNTER — Ambulatory Visit: Payer: BC Managed Care – PPO | Admitting: Family Medicine

## 2020-12-27 ENCOUNTER — Other Ambulatory Visit: Payer: Self-pay

## 2020-12-27 ENCOUNTER — Encounter: Payer: Self-pay | Admitting: Family Medicine

## 2020-12-27 VITALS — BP 142/78 | HR 72 | Temp 97.9°F | Resp 18 | Wt 151.4 lb

## 2020-12-27 DIAGNOSIS — E871 Hypo-osmolality and hyponatremia: Secondary | ICD-10-CM

## 2020-12-27 DIAGNOSIS — I10 Essential (primary) hypertension: Secondary | ICD-10-CM

## 2020-12-27 NOTE — Progress Notes (Signed)
Established patient visit   Patient: Eric Hatfield   DOB: 1956/01/24   65 y.o. Male  MRN: [id number] Visit Date: 12/27/2020  Today's healthcare provider: Mila Merry, MD   Chief Complaint  Patient presents with  . Hypertension   Subjective    HPI  Hypertension, follow-up  BP Readings from Last 3 Encounters:  12/27/20 (!) 146/84  06/28/20 (!) 142/74  06/23/19 138/72   Wt Readings from Last 3 Encounters:  12/27/20 151 lb 6.4 oz (68.7 kg)  06/28/20 152 lb (68.9 kg)  06/23/19 161 lb (73 kg)     He was last seen for hypertension 6 months ago.  BP at that visit was 142/74. Management since that visit includes adding valsartan 80mg  one tablet daily.  He reports good compliance with treatment. He is not having side effects.  He is following a Regular diet. He is not exercising. He does smoke.  Use of agents associated with hypertension: none.   Outside blood pressures are not checked. Symptoms: No chest pain No chest pressure  No palpitations No syncope  No dyspnea No orthopnea  No paroxysmal nocturnal dyspnea No lower extremity edema   Pertinent labs: Lab Results  Component Value Date   CHOL 211 (H) 06/23/2019   HDL 82 06/23/2019   LDLCALC 122 (H) 06/23/2019   TRIG 40 06/23/2019   CHOLHDL 2.6 06/23/2019   Lab Results  Component Value Date   NA 128 (L) 07/19/2020   K 4.6 07/19/2020   CREATININE 0.88 07/19/2020   GFRNONAA 91 07/19/2020   GFRAA 106 07/19/2020   GLUCOSE 103 (H) 07/19/2020     The 10-year ASCVD risk score 07/21/2020 DC Jr., et al., 2013) is: 17.6%   ---------------------------------------------------------------------------------------------------     Medications: Outpatient Medications Prior to Visit  Medication Sig  . amLODipine (NORVASC) 10 MG tablet Take 1 tablet (10 mg total) by mouth daily.  . carbamazepine (TEGRETOL) 200 MG tablet Take 3 tablets (600 mg total) by mouth as directed. TAKE ONE TABLETS IN THE MORNING AND TWO  TABLETS IN THE EVENING  . valsartan (DIOVAN) 80 MG tablet Take 1 tablet (80 mg total) by mouth daily.   No facility-administered medications prior to visit.    Review of Systems  Constitutional: Negative for appetite change, chills and fever.  Respiratory: Negative for chest tightness, shortness of breath and wheezing.   Cardiovascular: Negative for chest pain and palpitations.  Gastrointestinal: Negative for abdominal pain, nausea and vomiting.       Objective    BP (!) 142/78   Pulse 72   Temp 97.9 F (36.6 C) (Temporal)   Resp 18   Wt 151 lb 6.4 oz (68.7 kg)   BMI 21.72 kg/m     Physical Exam   General: Appearance:    Well developed, well nourished male in no acute distress  Eyes:    PERRL, conjunctiva/corneas clear, EOM's intact       Lungs:     Clear to auscultation bilaterally, respirations unlabored  Heart:    Normal heart rate. Normal rhythm.  2/6 systolic murmur  MS:   All extremities are intact.   Neurologic:   Awake, alert, oriented x 3. No apparent focal neurological           defect.          Assessment & Plan     1. Essential hypertension Near goal. Tolerating addition of valsartan. maxed out on amlodipine.  - Renal function panel  2. Hyponatremia Multifactorial, likely related to carbamazepine and underlying psychiatric condition.  - Renal function panel        The entirety of the information documented in the History of Present Illness, Review of Systems and Physical Exam were personally obtained by me. Portions of this information were initially documented by the CMA and reviewed by me for thoroughness and accuracy.      Mila Merry, MD  [facility] [phone] (phone) [phone] (fax)  [facility]

## 2020-12-28 LAB — RENAL FUNCTION PANEL
Albumin: 4.7 g/dL (ref 3.8–4.8)
BUN/Creatinine Ratio: 15 (ref 10–24)
BUN: 12 mg/dL (ref 8–27)
CO2: 21 mmol/L (ref 20–29)
Calcium: 9.2 mg/dL (ref 8.6–10.2)
Chloride: 92 mmol/L — ABNORMAL LOW (ref 96–106)
Creatinine, Ser: 0.79 mg/dL (ref 0.76–1.27)
Glucose: 108 mg/dL — ABNORMAL HIGH (ref 65–99)
Phosphorus: 3.8 mg/dL (ref 2.8–4.1)
Potassium: 4.8 mmol/L (ref 3.5–5.2)
Sodium: 130 mmol/L — ABNORMAL LOW (ref 134–144)
eGFR: 100 mL/min/{1.73_m2} (ref >59)

## 2020-12-30 ENCOUNTER — Telehealth: Payer: Self-pay

## 2020-12-30 NOTE — Telephone Encounter (Signed)
Tried calling; no answer.   PEC please advise pt of lab results if he calls back.   Thanks,   -Vernona Rieger

## 2020-12-30 NOTE — Telephone Encounter (Signed)
-----   Message from Malva Limes, MD sent at 12/30/2020  7:41 AM EDT ----- Sodium levels a little low, but stable. Continue current medications.  Follow up six months.

## 2020-12-31 ENCOUNTER — Other Ambulatory Visit: Payer: Self-pay | Admitting: Family Medicine

## 2020-12-31 DIAGNOSIS — I10 Essential (primary) hypertension: Secondary | ICD-10-CM

## 2020-12-31 NOTE — Telephone Encounter (Signed)
Patient's wife Vickie advised and verbalized understanding (ok per DPR). Vickie states she is going to have patient call back to schedule his 6 month follow up appointment.

## 2021-01-31 ENCOUNTER — Other Ambulatory Visit: Payer: Self-pay | Admitting: Family Medicine

## 2021-01-31 DIAGNOSIS — I10 Essential (primary) hypertension: Secondary | ICD-10-CM

## 2021-01-31 NOTE — Telephone Encounter (Signed)
Medication sent in. 

## 2021-02-04 ENCOUNTER — Other Ambulatory Visit: Payer: Self-pay | Admitting: Family Medicine

## 2021-02-04 DIAGNOSIS — I10 Essential (primary) hypertension: Secondary | ICD-10-CM

## 2021-02-05 ENCOUNTER — Telehealth (INDEPENDENT_AMBULATORY_CARE_PROVIDER_SITE_OTHER): Payer: BC Managed Care – PPO | Admitting: Psychiatry

## 2021-02-05 ENCOUNTER — Encounter: Payer: Self-pay | Admitting: Psychiatry

## 2021-02-05 ENCOUNTER — Other Ambulatory Visit: Payer: Self-pay

## 2021-02-05 DIAGNOSIS — F17200 Nicotine dependence, unspecified, uncomplicated: Secondary | ICD-10-CM

## 2021-02-05 DIAGNOSIS — F3176 Bipolar disorder, in full remission, most recent episode depressed: Secondary | ICD-10-CM | POA: Diagnosis not present

## 2021-02-05 DIAGNOSIS — G4700 Insomnia, unspecified: Secondary | ICD-10-CM

## 2021-02-05 NOTE — Progress Notes (Signed)
Virtual Visit via Telephone Note  I connected with Eric Hatfield on 02/05/21 at  4:30 PM EDT by telephone and verified that I am speaking with the correct person using two identifiers.  Location Provider Location : [facility] Patient Location : Home  Participants: Patient , Spouse, Provider   I discussed the limitations, risks, security and privacy concerns of performing an evaluation and management service by telephone and the availability of in person appointments. I also discussed with the patient that there may be a patient responsible charge related to this service. The patient expressed understanding and agreed to proceed.     I discussed the assessment and treatment plan with the patient. The patient was provided an opportunity to ask questions and all were answered. The patient agreed with the plan and demonstrated an understanding of the instructions.   The patient was advised to call back or seek an in-person evaluation if the symptoms worsen or if the condition fails to improve as anticipated.   BH MD OP Progress Note  02/05/2021 8:45 PM Eric Hatfield  MRN:  [id number]  Chief Complaint:  Chief Complaint    Follow-up; Insomnia     HPI: Eric Hatfield is a 65 year old Caucasian male, married, lives in [place], has a history of bipolar disorder, insomnia, tobacco use disorder was evaluated by telemedicine today.  Patient today reports he is overall doing well with regards to his mood.  He however does struggle with sleep.  He reports he has been waking up early at around 3 AM several nights.  He tried melatonin however recently  had GI problems from the melatonin.  He was able to get some Benadryl over-the-counter and has been using it.That does help to some extent and he has had no side effects.  Patient reports he is not interested in starting anew sleep medication yet and will let writer know whenever he is ready.  He agrees to work on Systems developer.  He  reports his wife's snoring could also be keeping him awake.  He reports work is going okay.  Patient is compliant on Tegretol.  Patient had repeat sodium level done recently and reports he was told by his primary care provider that it has been stable.  He was recently started on new antihypertensive and reports blood pressure is getting better.  Patient denies any suicidality, homicidality or perceptual disturbances.  Patient denies any other concerns today.  Visit Diagnosis:    ICD-10-CM   1. Bipolar disorder, in full remission, most recent episode depressed (HCC)  F31.76   2. Insomnia, unspecified type  G47.00   3. Tobacco use disorder  F17.200     Past Psychiatric History: I have reviewed past psychiatric history from progress note on 02/28/2019  Past Medical History:  Past Medical History:  Diagnosis Date  . Hypertension     Past Surgical History:  Procedure Laterality Date  . Impacted tooth      Family Psychiatric History: Reviewed family psychiatric history from progress note on 02/28/2019  Family History:  Family History  Problem Relation Age of Onset  . Hypertension Mother   . Hyperlipidemia Father   . Hypertension Brother   . Mental illness Paternal Grandmother     Social History: Reviewed social history from progress note on 02/28/2019 Social History   Socioeconomic History  . Marital status: Married    Spouse name: Not on file  . Number of children: Not on file  . Years of education: Not on  file  . Highest education level: Not on file  Occupational History  . Not on file  Tobacco Use  . Smoking status: Current Every Day Smoker    Packs/day: 1.00    Years: 30.00    Pack years: 30.00    Types: Cigarettes    Start date: 05/31/1975  . Smokeless tobacco: Never Used  . Tobacco comment: started smoking as a teenager average 1 ppd  Vaping Use  . Vaping Use: Never used  Substance and Sexual Activity  . Alcohol use: No    Alcohol/week: 0.0 standard drinks     Comment: quit drking in his 56s.   . Drug use: No  . Sexual activity: Yes    Birth control/protection: None  Other Topics Concern  . Not on file  Social History Narrative  . Not on file   Social Determinants of Health   Financial Resource Strain: Not on file  Food Insecurity: Not on file  Transportation Needs: Not on file  Physical Activity: Not on file  Stress: Not on file  Social Connections: Not on file    Allergies: No Known Allergies  Metabolic Disorder Labs: Lab Results  Component Value Date   HGBA1C 5.8 12/24/2010   No results found for: PROLACTIN Lab Results  Component Value Date   CHOL 211 (H) 06/23/2019   TRIG 40 06/23/2019   HDL 82 06/23/2019   CHOLHDL 2.6 06/23/2019   VLDL 6 04/27/2015   LDLCALC 122 (H) 06/23/2019   LDLCALC 102 (H) 12/11/2016   Lab Results  Component Value Date   TSH 0.896 07/19/2020   TSH 1.500 06/23/2019    Therapeutic Level Labs: No results found for: LITHIUM No results found for: VALPROATE No components found for:  CBMZ  Current Medications: Current Outpatient Medications  Medication Sig Dispense Refill  . amLODipine (NORVASC) 10 MG tablet TAKE 1 TABLET BY MOUTH DAILY 30 tablet 5  . carbamazepine (TEGRETOL) 200 MG tablet Take 3 tablets (600 mg total) by mouth as directed. TAKE ONE TABLETS IN THE MORNING AND TWO TABLETS IN THE EVENING 360 tablet 1  . valsartan (DIOVAN) 80 MG tablet TAKE 1 TABLET BY MOUTH DAILY 90 tablet 0   No current facility-administered medications for this visit.     Musculoskeletal: Strength & Muscle Tone: UTA Gait & Station: UTA Patient leans: N/A  Psychiatric Specialty Exam: Review of Systems  Psychiatric/Behavioral: Positive for sleep disturbance.  All other systems reviewed and are negative.   There were no vitals taken for this visit.There is no height or weight on file to calculate BMI.  General Appearance: UTA  Eye Contact:  UTA  Speech:  Clear and Coherent  Volume:  Normal   Mood:  Euthymic  Affect:  UTA  Thought Process:  Goal Directed and Descriptions of Associations: Intact  Orientation:  Full (Time, Place, and Person)  Thought Content: Logical   Suicidal Thoughts:  No  Homicidal Thoughts:  No  Memory:  Immediate;   Fair Recent;   Fair Remote;   Fair  Judgement:  Fair  Insight:  Fair  Psychomotor Activity:  UTA  Concentration:  Concentration: Fair and Attention Span: Fair  Recall:  [employer] of Knowledge: Fair  Language: Fair  Akathisia:  No  Handed:  Right  AIMS (if indicated): UTA  Assets:  Communication Skills Desire for Improvement Housing Social Support Talents/Skills  ADL's:  Intact  Cognition: WNL  Sleep:  Poor   Screenings: Conservation officer, nature Visit from  12/27/2020 in [facility] Office Visit from 12/10/2020 in [facility] Office Visit from 05/20/2018 in [facility] Office Visit from 12/11/2016 in [facility]  PHQ-2 Total Score 0 1 0 0  PHQ-9 Total Score 0 -- 0 0    Flowsheet Row Office Visit from 12/10/2020 in [facility]  C-SSRS RISK CATEGORY No Risk       Assessment and Plan: ADAIAH MORKEN is a 65 year old Caucasian male, married, employed, lives in [place], has a history of bipolar disorder in remission was evaluated by telemedicine today.  Patient is currently struggling with sleep however reports he is not interested in medication changes.  Plan Bipolar disorder in remission Tegretol 200 mg p.o. daily in the morning and 400 mg every afternoon. Tegretol level-07/19/2020 -9.8-therapeutic Patient recently had repeat sodium level-reviewed and discussed-dated 12/27/2020-130- stable  Insomnia- unstable Discussed sleep hygiene techniques. Discussed to keep a sleep diary. Discussed to sleep in a separate room since his wife's snoring keeps him awake. Discussed  starting sleep medication like trazodone, he is not  interested.  He will let Clinical research associate know if he is interested.  Tobacco use disorder-unstable Provided counseling  Follow-up in clinic in 6 to 8 weeks or sooner if needed.   I have spent at least 20 minutes non face to face with patient today.   This note was generated in part or whole with voice recognition software. Voice recognition is usually quite accurate but there are transcription errors that can and very often do occur. I apologize for any typographical errors that were not detected and corrected.      Jomarie Longs, MD 02/05/2021, 8:45 PM

## 2021-03-18 ENCOUNTER — Telehealth: Payer: Self-pay

## 2021-03-18 NOTE — Telephone Encounter (Signed)
Medication management. - Telephone call with patient after he came by the office and left a message requesting a call back.  Pt. reported he had gone back up on Tegretol 200mg  to 2 in the morning and 2 in the afternoon. Patient stated with just 1 in the morning and 2 at night he was not sleeping as well and his family had noticed he was rambling with conversations more.  Wanted to make sure this was okay as he returns for next appointment with Dr. on 04/07/21.  Informed Dr. 04/09/21 was out of the office for the next 2 weeks but would send to covering provider.  Patient stated he had just picked up a refill on 02/25/21 so he thinks he has enough to take 4 a days total until appointment if approved.

## 2021-03-18 NOTE — Telephone Encounter (Signed)
Spoke with pt after reviewing his chart and Dr. Elna Breslow notes. His Tegretol was decreased to 200 mg in AM and 400 mg QHS in March and was recommended to return to 400 mg bID if he notices any worsening. He reports that he is having some problems with sleep and mood. He already took Tegretol 400 mg BID this morning and reports that his wife noticed improvement. He is recommended to continue with that dose and call back for any questions.

## 2021-03-31 NOTE — Telephone Encounter (Signed)
Attempted to contact patient.  Left voicemail to reach [place] back.

## 2021-04-01 ENCOUNTER — Other Ambulatory Visit: Payer: Self-pay | Admitting: Psychiatry

## 2021-04-01 DIAGNOSIS — F3176 Bipolar disorder, in full remission, most recent episode depressed: Secondary | ICD-10-CM

## 2021-04-07 ENCOUNTER — Telehealth (INDEPENDENT_AMBULATORY_CARE_PROVIDER_SITE_OTHER): Payer: BC Managed Care – PPO | Admitting: Psychiatry

## 2021-04-07 ENCOUNTER — Encounter: Payer: Self-pay | Admitting: Psychiatry

## 2021-04-07 ENCOUNTER — Other Ambulatory Visit: Payer: Self-pay

## 2021-04-07 DIAGNOSIS — G4701 Insomnia due to medical condition: Secondary | ICD-10-CM | POA: Diagnosis not present

## 2021-04-07 DIAGNOSIS — F3176 Bipolar disorder, in full remission, most recent episode depressed: Secondary | ICD-10-CM

## 2021-04-07 DIAGNOSIS — F17200 Nicotine dependence, unspecified, uncomplicated: Secondary | ICD-10-CM | POA: Diagnosis not present

## 2021-04-07 NOTE — Progress Notes (Signed)
Virtual Visit via Telephone Note  I connected with Eric Hatfield on 04/07/21 at  4:40 PM EDT by telephone and verified that I am speaking with the correct person using two identifiers.  Location Provider Location : [facility] Patient Location : Home  Participants: Patient , Provider    I discussed the limitations, risks, security and privacy concerns of performing an evaluation and management service by telephone and the availability of in person appointments. I also discussed with the patient that there may be a patient responsible charge related to this service. The patient expressed understanding and agreed to proceed.    I discussed the assessment and treatment plan with the patient. The patient was provided an opportunity to ask questions and all were answered. The patient agreed with the plan and demonstrated an understanding of the instructions.   The patient was advised to call back or seek an in-person evaluation if the symptoms worsen or if the condition fails to improve as anticipated.   BH MD OP Progress Note  04/07/2021 5:17 PM Eric Hatfield  MRN:  [id number]  Chief Complaint:  Chief Complaint   Follow-up; Insomnia    HPI: Eric Hatfield is a 65 year old Caucasian male, married, lives in [place], has a history of bipolar disorder, insomnia, tobacco use disorder was evaluated by telemedicine today.  Patient today reports since being back on the Tegretol previous dosage of 400 mg twice a day he has been doing well.  Denies any significant mood lability or irritability.  He reports sleep is improving.  Patient denies any suicidality, homicidality or perceptual disturbances.  Patient reports he has been able to cut back on smoking cigarettes and currently smokes 0.75 pack/day.  This is an improvement for him.  Provided counseling and he is agreeable to continue to cut back.  He does have good support system from his wife.  He reports work is going well.  Patient  denies any other concerns today.  Visit Diagnosis:    ICD-10-CM   1. Bipolar disorder, in full remission, most recent episode depressed (HCC)  F31.76     2. Insomnia due to medical condition  G47.01    mood    3. Tobacco use disorder  F17.200       Past Psychiatric History: Reviewed past psychiatric history from progress note on 02/28/2019  Past Medical History:  Past Medical History:  Diagnosis Date   Bipolar disorder (HCC)    Hypertension     Past Surgical History:  Procedure Laterality Date   Impacted tooth      Family Psychiatric History: Reviewed family psychiatric history from progress note on 02/28/2019  Family History:  Family History  Problem Relation Age of Onset   Hypertension Mother    Hyperlipidemia Father    Hypertension Brother    Mental illness Paternal Grandmother     Social History: Reviewed social history from progress note on 02/28/2019 Social History   Socioeconomic History   Marital status: Married    Spouse name: Not on file   Number of children: Not on file   Years of education: Not on file   Highest education level: Not on file  Occupational History   Not on file  Tobacco Use   Smoking status: Every Day    Packs/day: 0.75    Years: 30.00    Pack years: 22.50    Types: Cigarettes    Start date: 05/31/1975   Smokeless tobacco: Never   Tobacco comments:    started  smoking as a teenager average 1 ppd  Vaping Use   Vaping Use: Never used  Substance and Sexual Activity   Alcohol use: No    Alcohol/week: 0.0 standard drinks    Comment: quit drking in his 59s.    Drug use: No   Sexual activity: Yes    Birth control/protection: None  Other Topics Concern   Not on file  Social History Narrative   Not on file   Social Determinants of Health   Financial Resource Strain: Not on file  Food Insecurity: Not on file  Transportation Needs: Not on file  Physical Activity: Not on file  Stress: Not on file  Social Connections: Not on file     Allergies: No Known Allergies  Metabolic Disorder Labs: Lab Results  Component Value Date   HGBA1C 5.8 12/24/2010   No results found for: PROLACTIN Lab Results  Component Value Date   CHOL 211 (H) 06/23/2019   TRIG 40 06/23/2019   HDL 82 06/23/2019   CHOLHDL 2.6 06/23/2019   VLDL 6 04/27/2015   LDLCALC 122 (H) 06/23/2019   LDLCALC 102 (H) 12/11/2016   Lab Results  Component Value Date   TSH 0.896 07/19/2020   TSH 1.500 06/23/2019    Therapeutic Level Labs: No results found for: LITHIUM No results found for: VALPROATE No components found for:  CBMZ  Current Medications: Current Outpatient Medications  Medication Sig Dispense Refill   amLODipine (NORVASC) 10 MG tablet TAKE 1 TABLET BY MOUTH DAILY 30 tablet 5   carbamazepine (TEGRETOL) 200 MG tablet TAKE TWO TABLETS EVERY MORNING AND TAKE TWO TABLETS EVERY EVENING 360 tablet 1   valsartan (DIOVAN) 80 MG tablet TAKE 1 TABLET BY MOUTH DAILY 90 tablet 0   No current facility-administered medications for this visit.     Musculoskeletal: Strength & Muscle Tone:  UTA Gait & Station:  UTA Patient leans: N/A  Psychiatric Specialty Exam: Review of Systems  Psychiatric/Behavioral:  Negative for agitation, behavioral problems, confusion, decreased concentration, dysphoric mood, hallucinations, self-injury, sleep disturbance and suicidal ideas. The patient is not nervous/anxious and is not hyperactive.   All other systems reviewed and are negative.  There were no vitals taken for this visit.There is no height or weight on file to calculate BMI.  General Appearance:  UTA  Eye Contact:   UTA  Speech:  Clear and Coherent  Volume:  Normal  Mood:  Euthymic  Affect:   UTA  Thought Process:  Goal Directed and Descriptions of Associations: Intact  Orientation:  Full (Time, Place, and Person)  Thought Content: Logical   Suicidal Thoughts:  No  Homicidal Thoughts:  No  Memory:  Immediate;   Fair Recent;   Fair Remote;    Fair  Judgement:  Fair  Insight:  Fair  Psychomotor Activity:   UTA  Concentration:  Concentration: Fair and Attention Span: Fair  Recall:  [employer] of Knowledge: Fair  Language: Fair  Akathisia:  No  Handed:  Right  AIMS (if indicated): not done  Assets:  Communication Skills Desire for Improvement Housing Intimacy Social Support  ADL's:  Intact  Cognition: WNL  Sleep:  Fair   Screenings: PHQ2-9    Flowsheet Row Video Visit from 04/07/2021 in [facility] Office Visit from 12/27/2020 in [facility] Office Visit from 12/10/2020 in [facility] Office Visit from 05/20/2018 in [facility] Office Visit from 12/11/2016 in [facility]  PHQ-2 Total Score 1 0 1  0 0  PHQ-9 Total Score 2 0 -- 0 0      Flowsheet Row Office Visit from 12/10/2020 in [facility]  C-SSRS RISK CATEGORY No Risk        Assessment and Plan: Eric Hatfield is a 65 year old Caucasian male, married, employed, lives in [place], has a history of bipolar disorder in remission was evaluated by telemedicine today.  Patient is currently making progress.  Plan as noted below.  Plan Bipolar disorder in remission Tegretol 400 mg p.o. twice daily. Tegretol level-07/19/2020-9.8 therapeutic   Insomnia-improving We will continue to monitor.  Continue sleep hygiene techniques  Tobacco use disorder-improving Provided counseling  Follow-up in clinic in 3 months or sooner if needed.   I have spent at least 18 minutes non face to face with patient today .     This note was generated in part or whole with voice recognition software. Voice recognition is usually quite accurate but there are transcription errors that can and very often do occur. I apologize for any typographical errors that were not detected and corrected.     Jomarie Longs, MD 04/08/2021, 8:30 AM

## 2021-04-24 ENCOUNTER — Other Ambulatory Visit: Payer: Self-pay | Admitting: Family Medicine

## 2021-04-24 DIAGNOSIS — I10 Essential (primary) hypertension: Secondary | ICD-10-CM

## 2021-05-01 ENCOUNTER — Encounter: Payer: Self-pay | Admitting: Psychiatry

## 2021-05-01 ENCOUNTER — Observation Stay
Admission: EM | Admit: 2021-05-01 | Discharge: 2021-05-02 | Disposition: A | Discharge status: Home / Self Care | Payer: BC Managed Care – PPO | Source: Ambulatory Visit | Attending: Internal Medicine | Admitting: Internal Medicine

## 2021-05-01 ENCOUNTER — Other Ambulatory Visit: Payer: Self-pay

## 2021-05-01 ENCOUNTER — Encounter: Payer: Self-pay | Admitting: Emergency Medicine

## 2021-05-01 ENCOUNTER — Ambulatory Visit (INDEPENDENT_AMBULATORY_CARE_PROVIDER_SITE_OTHER): Payer: BC Managed Care – PPO | Admitting: Psychiatry

## 2021-05-01 DIAGNOSIS — I10 Essential (primary) hypertension: Secondary | ICD-10-CM | POA: Diagnosis present

## 2021-05-01 DIAGNOSIS — R4182 Altered mental status, unspecified: Secondary | ICD-10-CM | POA: Diagnosis not present

## 2021-05-01 DIAGNOSIS — Z8249 Family history of ischemic heart disease and other diseases of the circulatory system: Secondary | ICD-10-CM | POA: Diagnosis not present

## 2021-05-01 DIAGNOSIS — R339 Retention of urine, unspecified: Secondary | ICD-10-CM | POA: Diagnosis present

## 2021-05-01 DIAGNOSIS — Z79899 Other long term (current) drug therapy: Secondary | ICD-10-CM | POA: Diagnosis not present

## 2021-05-01 DIAGNOSIS — T421X5A Adverse effect of iminostilbenes, initial encounter: Secondary | ICD-10-CM | POA: Diagnosis present

## 2021-05-01 DIAGNOSIS — R12 Heartburn: Secondary | ICD-10-CM | POA: Diagnosis present

## 2021-05-01 DIAGNOSIS — F600 Paranoid personality disorder: Secondary | ICD-10-CM | POA: Diagnosis not present

## 2021-05-01 DIAGNOSIS — F3110 Bipolar disorder, current episode manic without psychotic features, unspecified: Principal | ICD-10-CM

## 2021-05-01 DIAGNOSIS — R440 Auditory hallucinations: Secondary | ICD-10-CM

## 2021-05-01 DIAGNOSIS — F17210 Nicotine dependence, cigarettes, uncomplicated: Secondary | ICD-10-CM | POA: Diagnosis not present

## 2021-05-01 DIAGNOSIS — F29 Unspecified psychosis not due to a substance or known physiological condition: Secondary | ICD-10-CM | POA: Diagnosis not present

## 2021-05-01 DIAGNOSIS — E871 Hypo-osmolality and hyponatremia: Secondary | ICD-10-CM | POA: Diagnosis present

## 2021-05-01 DIAGNOSIS — Z20822 Contact with and (suspected) exposure to covid-19: Secondary | ICD-10-CM | POA: Diagnosis not present

## 2021-05-01 DIAGNOSIS — Z8349 Family history of other endocrine, nutritional and metabolic diseases: Secondary | ICD-10-CM

## 2021-05-01 DIAGNOSIS — F312 Bipolar disorder, current episode manic severe with psychotic features: Secondary | ICD-10-CM

## 2021-05-01 DIAGNOSIS — F3163 Bipolar disorder, current episode mixed, severe, without psychotic features: Secondary | ICD-10-CM

## 2021-05-01 DIAGNOSIS — F3112 Bipolar disorder, current episode manic without psychotic features, moderate: Secondary | ICD-10-CM

## 2021-05-01 DIAGNOSIS — F3176 Bipolar disorder, in full remission, most recent episode depressed: Secondary | ICD-10-CM

## 2021-05-01 LAB — URINALYSIS, COMPLETE (UACMP) WITH MICROSCOPIC
Bilirubin Urine: NEGATIVE
Glucose, UA: NEGATIVE mg/dL
Ketones, ur: NEGATIVE mg/dL
Leukocytes,Ua: NEGATIVE
Nitrite: NEGATIVE
Protein, ur: NEGATIVE mg/dL
Specific Gravity, Urine: 1.018 (ref 1.005–1.030)
Squamous Epithelial / HPF: NONE SEEN (ref 0–5)
pH: 5 (ref 5.0–8.0)

## 2021-05-01 LAB — RESP PANEL BY RT-PCR (FLU A&B, COVID) ARPGX2
Influenza A by PCR: NEGATIVE
Influenza B by PCR: NEGATIVE
SARS Coronavirus 2 by RT PCR: NEGATIVE

## 2021-05-01 LAB — URINE DRUG SCREEN, QUALITATIVE (ARMC ONLY)
Amphetamines, Ur Screen: NOT DETECTED
Barbiturates, Ur Screen: NOT DETECTED
Benzodiazepine, Ur Scrn: NOT DETECTED
Cannabinoid 50 Ng, Ur ~~LOC~~: NOT DETECTED
Cocaine Metabolite,Ur ~~LOC~~: NOT DETECTED
MDMA (Ecstasy)Ur Screen: NOT DETECTED
Methadone Scn, Ur: NOT DETECTED
Opiate, Ur Screen: NOT DETECTED
Phencyclidine (PCP) Ur S: NOT DETECTED
Tricyclic, Ur Screen: NOT DETECTED

## 2021-05-01 LAB — CBC
HCT: 35.5 % — ABNORMAL LOW (ref 39.0–52.0)
Hemoglobin: 13.2 g/dL (ref 13.0–17.0)
MCH: 35.5 pg — ABNORMAL HIGH (ref 26.0–34.0)
MCHC: 37.2 g/dL — ABNORMAL HIGH (ref 30.0–36.0)
MCV: 95.4 fL (ref 80.0–100.0)
Platelets: 243 10*3/uL (ref 150–400)
RBC: 3.72 MIL/uL — ABNORMAL LOW (ref 4.22–5.81)
RDW: 12.9 % (ref 11.5–15.5)
WBC: 7.1 10*3/uL (ref 4.0–10.5)
nRBC: 0 % (ref 0.0–0.2)

## 2021-05-01 LAB — COMPREHENSIVE METABOLIC PANEL
ALT: 30 U/L (ref 0–44)
AST: 26 U/L (ref 15–41)
Albumin: 4.6 g/dL (ref 3.5–5.0)
Alkaline Phosphatase: 106 U/L (ref 38–126)
Anion gap: 10 (ref 5–15)
BUN: 18 mg/dL (ref 8–23)
CO2: 23 mmol/L (ref 22–32)
Calcium: 9.5 mg/dL (ref 8.9–10.3)
Chloride: 91 mmol/L — ABNORMAL LOW (ref 98–111)
Creatinine, Ser: 0.82 mg/dL (ref 0.61–1.24)
GFR, Estimated: >60 mL/min (ref >60)
Glucose, Bld: 114 mg/dL — ABNORMAL HIGH (ref 70–99)
Potassium: 4.6 mmol/L (ref 3.5–5.1)
Sodium: 124 mmol/L — ABNORMAL LOW (ref 135–145)
Total Bilirubin: 0.9 mg/dL (ref 0.3–1.2)
Total Protein: 7.1 g/dL (ref 6.5–8.1)

## 2021-05-01 LAB — OSMOLALITY: Osmolality: 263 mOsm/kg — ABNORMAL LOW (ref 275–295)

## 2021-05-01 LAB — ETHANOL: Alcohol, Ethyl (B): <10 mg/dL (ref <10)

## 2021-05-01 LAB — CARBAMAZEPINE LEVEL, TOTAL: Carbamazepine Lvl: 10.2 ug/mL (ref 4.0–12.0)

## 2021-05-01 LAB — SALICYLATE LEVEL: Salicylate Lvl: <7 mg/dL — ABNORMAL LOW (ref 7.0–30.0)

## 2021-05-01 LAB — ACETAMINOPHEN LEVEL: Acetaminophen (Tylenol), Serum: <10 ug/mL — ABNORMAL LOW (ref 10–30)

## 2021-05-01 MED ORDER — LORAZEPAM 2 MG PO TABS
2.0000 mg | ORAL_TABLET | Freq: Four times a day (QID) | ORAL | Status: DC | PRN
  Administered 2021-05-01: 2 mg via ORAL
  Filled 2021-05-01: qty 1

## 2021-05-01 MED ORDER — ONDANSETRON HCL 4 MG/2ML IJ SOLN: 4.0000 mg | Freq: Four times a day (QID) | INTRAMUSCULAR | Status: DC | PRN

## 2021-05-01 MED ORDER — IRBESARTAN 75 MG PO TABS
37.5000 mg | ORAL_TABLET | Freq: Every day | ORAL | Status: DC
  Administered 2021-05-02: 10:00:00 37.5 mg via ORAL
  Filled 2021-05-01 (×2): qty 0.5

## 2021-05-01 MED ORDER — SODIUM CHLORIDE 0.9 % IV SOLN: INTRAVENOUS | Status: DC

## 2021-05-01 MED ORDER — MAGNESIUM HYDROXIDE 400 MG/5ML PO SUSP
30.0000 mL | Freq: Every day | ORAL | Status: DC | PRN
  Filled 2021-05-01: qty 30

## 2021-05-01 MED ORDER — CARBAMAZEPINE 200 MG PO TABS
400.0000 mg | ORAL_TABLET | Freq: Two times a day (BID) | ORAL | Status: DC
  Administered 2021-05-01 – 2021-05-02 (×2): 400 mg via ORAL
  Filled 2021-05-01 (×3): qty 2

## 2021-05-01 MED ORDER — ACETAMINOPHEN 325 MG PO TABS: 650.0000 mg | ORAL_TABLET | Freq: Four times a day (QID) | ORAL | Status: DC | PRN

## 2021-05-01 MED ORDER — ENOXAPARIN SODIUM 40 MG/0.4ML IJ SOSY
40.0000 mg | PREFILLED_SYRINGE | INTRAMUSCULAR | Status: DC
  Administered 2021-05-01: 40 mg via SUBCUTANEOUS
  Filled 2021-05-01: qty 0.4

## 2021-05-01 MED ORDER — CARBAMAZEPINE ER 200 MG PO CP12: 400.0000 mg | ORAL_CAPSULE | Freq: Two times a day (BID) | ORAL | Status: DC

## 2021-05-01 MED ORDER — AMLODIPINE BESYLATE 10 MG PO TABS
10.0000 mg | ORAL_TABLET | Freq: Every day | ORAL | Status: DC
  Administered 2021-05-02: 09:00:00 10 mg via ORAL
  Filled 2021-05-01: qty 1

## 2021-05-01 MED ORDER — ONDANSETRON HCL 4 MG PO TABS: 4.0000 mg | ORAL_TABLET | Freq: Four times a day (QID) | ORAL | Status: DC | PRN

## 2021-05-01 MED ORDER — TRAZODONE HCL 50 MG PO TABS: 25.0000 mg | ORAL_TABLET | Freq: Every evening | ORAL | Status: DC | PRN

## 2021-05-01 MED ORDER — ACETAMINOPHEN 650 MG RE SUPP: 650.0000 mg | Freq: Four times a day (QID) | RECTAL | Status: DC | PRN

## 2021-05-01 NOTE — ED Notes (Signed)
Pt back in rm

## 2021-05-01 NOTE — ED Notes (Signed)
Pt went to bathroom, states he is unable to urinate at this time.

## 2021-05-01 NOTE — ED Triage Notes (Signed)
Pt comes into the ED via POV with his wife where his MD sent him over for psychiatric evaluation.  Pt is having auditory hallucinations and is not sleeping.  Pt's MD refused to do a medication change until blood work and evaluation were completed.  Pt informed his mother that someone was "bugging" his home.  Pt in NAD.

## 2021-05-01 NOTE — ED Notes (Signed)
Pt given dinner tray.

## 2021-05-01 NOTE — ED Notes (Signed)
Empty out Pt's foley bag.

## 2021-05-01 NOTE — ED Notes (Signed)
Pt in interview rm with Dr. Toni Amend at this time.

## 2021-05-01 NOTE — ED Notes (Signed)
Lab called to add on labs

## 2021-05-01 NOTE — ED Notes (Signed)
Pt given verbal reassurance about foley. Pt states he feels better now that his bladder is empty. Pt in bed, pt instructed to call for help when he wants to get up so staff can assist with foley.

## 2021-05-01 NOTE — ED Notes (Signed)
Gave Pt drink and snack.

## 2021-05-01 NOTE — Consult Note (Signed)
[facility] Face-to-Face Psychiatry Consult   Reason for Consult: Consult for 65 year old man with a history of bipolar disorder sent over to the emergency room from the outpatient clinic Referring Physician: [facility] Patient Identification: Eric Hatfield MRN:  [id number] Principal Diagnosis: Bipolar affective disorder, current episode manic (HCC) Diagnosis:  Principal Problem:   Bipolar affective disorder, current episode manic (HCC) Active Problems:   Hyponatremia   Essential hypertension   Total Time spent with patient: 1 hour  Subjective:   Eric Hatfield is a 65 y.o. male patient admitted with "it has been stressful".  HPI: Patient seen chart reviewed.  Also spoke with his primary outpatient psychiatrist on the phone.  65 year old man with a long history of bipolar disorder who had been very stable for many years with his primary treatment being carbamazepine.  Today he presented to his outpatient psychiatrist with symptoms of mania.  Patient gave history and apparently his mother and wife confirmed that he has not been sleeping well for a couple of weeks, he is hearing voices and his wife has reported that he seems to be talking to people on the radio.  He has not done anything violent and he denies suicidal ideation.  Patient admits that he is having trouble concentrating.  In conversation he is very disorganized and difficult to follow.  Instead of answering questions directly tends to stutter a bit and then tell stories about things that happened years ago in a circular manner.  Patient is not aggressive or agitated and understands that he is here in the emergency room for psychiatric evaluation.  He denies any recent alcohol or drug use.  Apparently a few months ago Dr. Elna Breslow had suggested a slight decrease in Tegretol dose because the patient's mood disorder had been so stable for so long and because there was concern that it may be causing his chronic hyponatremia.  Patient tried a  slight decrease in dose but started to have sleep problems.  He was instructed to go back to 400 mg twice a day his previous dose and has been back on that dose now for over a month but has progressed with the manic symptoms.  Past Psychiatric History: Long history of bipolar disorder.  Patient states he is only ever had 1 hospitalization and that was so long ago it predates this hospital.  It was at the old [facility] and was probably around 30 years ago.  He cannot remember any medicines he has been on in the past besides the carbamazepine.  No history of suicide attempts.  Distant past history of alcohol and marijuana use but it has apparently been years since he did either of those.  No known history of violence.  Risk to Self:   Risk to Others:   Prior Inpatient Therapy:   Prior Outpatient Therapy:    Past Medical History:  Past Medical History:  Diagnosis Date   Bipolar disorder (HCC)    Hypertension     Past Surgical History:  Procedure Laterality Date   Impacted tooth     Family History:  Family History  Problem Relation Age of Onset   Hypertension Mother    Hyperlipidemia Father    Hypertension Brother    Mental illness Paternal Grandmother    Family Psychiatric  History: Patient reports his grandmother and great aunt both had nerve problems and that there is other mental illness in his family Social History:  Social History   Substance and Sexual Activity  Alcohol Use No  Alcohol/week: 0.0 standard drinks   Comment: quit drking in his 58s.      Social History   Substance and Sexual Activity  Drug Use No    Social History   Socioeconomic History   Marital status: Married    Spouse name: Not on file   Number of children: Not on file   Years of education: Not on file   Highest education level: Not on file  Occupational History   Not on file  Tobacco Use   Smoking status: Every Day    Packs/day: 0.75    Years: 30.00    Pack years: 22.50    Types:  Cigarettes    Start date: 05/31/1975   Smokeless tobacco: Never   Tobacco comments:    started smoking as a teenager average 1 ppd  Vaping Use   Vaping Use: Never used  Substance and Sexual Activity   Alcohol use: No    Alcohol/week: 0.0 standard drinks    Comment: quit drking in his 40s.    Drug use: No   Sexual activity: Yes    Birth control/protection: None  Other Topics Concern   Not on file  Social History Narrative   Not on file   Social Determinants of Health   Financial Resource Strain: Not on file  Food Insecurity: Not on file  Transportation Needs: Not on file  Physical Activity: Not on file  Stress: Not on file  Social Connections: Not on file   Additional Social History:    Allergies:  No Known Allergies  Labs:  Results for orders placed or performed during the hospital encounter of 05/01/21 (from the past 48 hour(s))  Comprehensive metabolic panel     Status: Abnormal   Collection Time: 05/01/21  3:50 PM  Result Value Ref Range   Sodium 124 (L) 135 - 145 mmol/L   Potassium 4.6 3.5 - 5.1 mmol/L   Chloride 91 (L) 98 - 111 mmol/L   CO2 23 22 - 32 mmol/L   Glucose, Bld 114 (H) 70 - 99 mg/dL    Comment: Glucose reference range applies only to samples taken after fasting for at least 8 hours.   BUN 18 8 - 23 mg/dL   Creatinine, Ser 1.32 0.61 - 1.24 mg/dL   Calcium 9.5 8.9 - 44.0 mg/dL   Total Protein 7.1 6.5 - 8.1 g/dL   Albumin 4.6 3.5 - 5.0 g/dL   AST 26 15 - 41 U/L   ALT 30 0 - 44 U/L   Alkaline Phosphatase 106 38 - 126 U/L   Total Bilirubin 0.9 0.3 - 1.2 mg/dL   GFR, Estimated >10 >27 mL/min    Comment: (NOTE) Calculated using the CKD-EPI Creatinine Equation (2021)    Anion gap 10 5 - 15    Comment: Performed at [facility], [address] [place], [place] [address]  Ethanol     Status: None   Collection Time: 05/01/21  3:50 PM  Result Value Ref Range   Alcohol, Ethyl (B) <10 <10 mg/dL    Comment: (NOTE) Lowest detectable limit for  serum alcohol is 10 mg/dL.  For medical purposes only. Performed at [facility], [address] [place], [place] [address]   Salicylate level     Status: Abnormal   Collection Time: 05/01/21  3:50 PM  Result Value Ref Range   Salicylate Lvl <7.0 (L) 7.0 - 30.0 mg/dL    Comment: Performed at [facility], [address] [place],  [place] [address]  Acetaminophen level     Status: Abnormal   Collection Time: 05/01/21  3:50 PM  Result Value Ref Range   Acetaminophen (Tylenol), Serum <10 (L) 10 - 30 ug/mL    Comment: (NOTE) Therapeutic concentrations vary significantly. A range of 10-30 ug/mL  may be an effective concentration for many patients. However, some  are best treated at concentrations outside of this range. Acetaminophen concentrations >150 ug/mL at 4 hours after ingestion  and >50 ug/mL at 12 hours after ingestion are often associated with  toxic reactions.  Performed at [facility], [address] [place], [place] [address]   cbc     Status: Abnormal   Collection Time: 05/01/21  3:50 PM  Result Value Ref Range   WBC 7.1 4.0 - 10.5 K/uL   RBC 3.72 (L) 4.22 - 5.81 MIL/uL   Hemoglobin 13.2 13.0 - 17.0 g/dL   HCT 63.8 (L) 93.7 - 34.2 %   MCV 95.4 80.0 - 100.0 fL   MCH 35.5 (H) 26.0 - 34.0 pg   MCHC 37.2 (H) 30.0 - 36.0 g/dL   RDW 87.6 81.1 - 57.2 %   Platelets 243 150 - 400 K/uL   nRBC 0.0 0.0 - 0.2 %    Comment: Performed at [facility], [address] [place], [place] [address]  Carbamazepine level, total     Status: None   Collection Time: 05/01/21  3:50 PM  Result Value Ref Range   Carbamazepine Lvl 10.2 4.0 - 12.0 ug/mL    Comment: Performed at [facility], [address], [place], [place] [address]  Resp Panel by RT-PCR (Flu A&B, Covid) Nasopharyngeal Swab     Status: None   Collection Time: 05/01/21  4:39 PM   Specimen: Nasopharyngeal Swab; Nasopharyngeal(NP) swabs in vial transport medium  Result  Value Ref Range   SARS Coronavirus 2 by RT PCR NEGATIVE NEGATIVE    Comment: (NOTE) SARS-CoV-2 target nucleic acids are NOT DETECTED.  The SARS-CoV-2 RNA is generally detectable in upper respiratory specimens during the acute phase of infection. The lowest concentration of SARS-CoV-2 viral copies this assay can detect is 138 copies/mL. A negative result does not preclude SARS-Cov-2 infection and should not be used as the sole basis for treatment or other patient management decisions. A negative result may occur with  improper specimen collection/handling, submission of specimen other than nasopharyngeal swab, presence of viral mutation(s) within the areas targeted by this assay, and inadequate number of viral copies(<138 copies/mL). A negative result must be combined with clinical observations, patient history, and epidemiological information. The expected result is Negative.  Fact Sheet for Patients:  BloggerCourse.com  Fact Sheet for Healthcare Providers:  SeriousBroker.it  This test is no t yet approved or cleared by the [place] FDA and  has been authorized for detection and/or diagnosis of SARS-CoV-2 by FDA under an Emergency Use Authorization (EUA). This EUA will remain  in effect (meaning this test can be used) for the duration of the COVID-19 declaration under Section 564(b)(1) of the Act, 21 U.S.C.section 360bbb-3(b)(1), unless the authorization is terminated  or revoked sooner.       Influenza A by PCR NEGATIVE NEGATIVE   Influenza B by PCR NEGATIVE NEGATIVE    Comment: (NOTE) The Xpert Xpress SARS-CoV-2/FLU/RSV plus assay is intended as an aid in the diagnosis of influenza from Nasopharyngeal swab specimens and should not be used as a sole basis for treatment. Nasal washings and aspirates are unacceptable for  Xpert Xpress SARS-CoV-2/FLU/RSV testing.  Fact Sheet for  Patients: BloggerCourse.comhttps://www.fda.gov/media/152166/download  Fact Sheet for Healthcare Providers: SeriousBroker.ithttps://www.fda.gov/media/152162/download  This test is not yet approved or cleared by the [place] FDA and has been authorized for detection and/or diagnosis of SARS-CoV-2 by FDA under an Emergency Use Authorization (EUA). This EUA will remain in effect (meaning this test can be used) for the duration of the COVID-19 declaration under Section 564(b)(1) of the Act, 21 U.S.C. section 360bbb-3(b)(1), unless the authorization is terminated or revoked.  Performed at [facility], [address] [place], [place] [id number]     No current facility-administered medications for this encounter.   Current Outpatient Medications  Medication Sig Dispense Refill   amLODipine (NORVASC) 10 MG tablet TAKE 1 TABLET BY MOUTH DAILY 30 tablet 5   carbamazepine (TEGRETOL) 200 MG tablet TAKE TWO TABLETS EVERY MORNING AND TAKE TWO TABLETS EVERY EVENING 360 tablet 1   valsartan (DIOVAN) 80 MG tablet TAKE 1 TABLET BY MOUTH DAILY. 90 tablet 0    Musculoskeletal: Strength & Muscle Tone: within normal limits Gait & Station: normal Patient leans: N/A            Psychiatric Specialty Exam:  Presentation  General Appearance:  No data recorded Eye Contact: No data recorded Speech: No data recorded Speech Volume: No data recorded Handedness: No data recorded  Mood and Affect  Mood: No data recorded Affect: No data recorded  Thought Process  Thought Processes: No data recorded Descriptions of Associations:No data recorded Orientation:No data recorded Thought Content:No data recorded History of Schizophrenia/Schizoaffective disorder:No data recorded Duration of Psychotic Symptoms:No data recorded Hallucinations:No data recorded Ideas of Reference:No data recorded Suicidal Thoughts:No data recorded Homicidal Thoughts:No data recorded  Sensorium  Memory: No data  recorded Judgment: No data recorded Insight: No data recorded  Executive Functions  Concentration: No data recorded Attention Span: No data recorded Recall: No data recorded Fund of Knowledge: No data recorded Language: No data recorded  Psychomotor Activity  Psychomotor Activity: No data recorded  Assets  Assets: No data recorded  Sleep  Sleep: No data recorded  Physical Exam: Physical Exam Nursing note reviewed.  Constitutional:      Appearance: Normal appearance.  HENT:     Head: Normocephalic and atraumatic.     Mouth/Throat:     Pharynx: Oropharynx is clear.  Eyes:     Pupils: Pupils are equal, round, and reactive to light.  Cardiovascular:     Rate and Rhythm: Normal rate and regular rhythm.  Pulmonary:     Effort: Pulmonary effort is normal.     Breath sounds: Normal breath sounds.  Abdominal:     General: Abdomen is flat.     Palpations: Abdomen is soft.  Musculoskeletal:        General: Normal range of motion.  Skin:    General: Skin is warm and dry.  Neurological:     General: No focal deficit present.     Mental Status: He is alert. Mental status is at baseline.  Psychiatric:        Attention and Perception: He is inattentive. He perceives auditory hallucinations.        Mood and Affect: Mood is anxious. Affect is labile.        Speech: Speech is tangential.        Behavior: Behavior is not aggressive or hyperactive.        Thought Content: Thought content is paranoid. Thought content does not include homicidal or suicidal ideation.  Cognition and Memory: Cognition is impaired.        Judgment: Judgment is impulsive.   Review of Systems  Constitutional: Negative.   HENT: Negative.    Eyes: Negative.   Respiratory: Negative.    Cardiovascular: Negative.   Gastrointestinal: Negative.   Musculoskeletal: Negative.   Skin: Negative.   Neurological: Negative.   Psychiatric/Behavioral:  Positive for hallucinations. The patient is  nervous/anxious and has insomnia.   Blood pressure 121/80, pulse 90, temperature 98.2 F (36.8 C), temperature source Oral, resp. rate 18, height  (1.778 m), weight 68.7 kg, SpO2 97 %. Body mass index is 21.73 kg/m.  Treatment Plan Summary: Plan I was unable to reach his wife on the phone.  Apparently mother also is a good source of history but I do not have a number for her.  Although the patient is not suicidal or violent the psychotic symptoms and the worsening condition that has been of concern to his family justifies recommending inpatient hospitalization.  Patient was informed of this and is not hospital but is not very enthusiastic about it.  As a consequence I will probably file commitment papers.  I will continue his usual medicines for now.  Recheck chemistry panel in the morning.  I have left messages for the inpatient treatment team recommending admission to our unit downstairs.  Signing out follow-up on this to nighttime team.  Disposition: Recommend psychiatric Inpatient admission when medically cleared.  Mordecai Rasmussen, MD 05/01/2021 5:38 PM

## 2021-05-01 NOTE — H&P (Signed)
[place]   PATIENT NAME: Eric Hatfield    MR#:  [id number]  DATE OF BIRTH:  Apr 05, 1956  DATE OF ADMISSION:  05/01/2021  PRIMARY CARE PHYSICIAN: Malva Limes, MD   Patient is coming from: Home  REQUESTING/REFERRING PHYSICIAN: Donna Bernard, MD  CHIEF COMPLAINT:   Chief Complaint  Patient presents with   Psychiatric Evaluation    HISTORY OF PRESENT ILLNESS:  GERAL Hatfield is a 65 y.o. Caucasian male with medical history significant for bipolar disorder and hypertension, who presented to [facility] with altered mental status with auditory hallucination with paranoid ideation.  The patient has not been sleeping well over the last couple weeks and has been hearing voices and talking to people over the radio with no suicidal or homicidal ideation.  The patient was seen by Dr. Toni Amend who thought he was in current manic episode with his bipolar affective disorder.  The patient initially presented under IVC.  The patient was noted to be hyponatremic and was having urinary retention.  He denies any nausea or vomiting or diarrhea.  No dysuria or hematuria or flank pain.  No cough or wheezing or dyspnea.  No chest pain or palpitations.  He admitted to mild epigastric pain felt as heartburn earlier.  ED Course: Upon presentation to the emergency room, vital signs were within normal.  Labs revealed hyponatremia of 124 compared to 130 on 12/27/2020.  CBC was unremarkable.  Tylenol level was less than 10 and Tegretol level 10.2 and salicylate less than 7.  Alcohol level was less than 10.  Influenza antigens and COVID-19 PCR came back negative.  Urinalysis was unremarkable.  Urine drug screen came back negative.  EKG as reviewed by me : EKG is normal sinus rhythm with a rate of 82 with Q waves anteroseptally.  The patient will be hydrated with IV normal saline.  He will be admitted to a medical  bed for further evaluation and management.  PAST MEDICAL HISTORY:   Past  Medical History:  Diagnosis Date   Bipolar disorder (HCC)    Hypertension     PAST SURGICAL HISTORY:   Past Surgical History:  Procedure Laterality Date   Impacted tooth      SOCIAL HISTORY:   Social History   Tobacco Use   Smoking status: Every Day    Packs/day: 0.75    Years: 30.00    Pack years: 22.50    Types: Cigarettes    Start date: 05/31/1975   Smokeless tobacco: Never   Tobacco comments:    started smoking as a teenager average 1 ppd  Substance Use Topics   Alcohol use: No    Alcohol/week: 0.0 standard drinks    Comment: quit drking in his 40s.     FAMILY HISTORY:   Family History  Problem Relation Age of Onset   Hypertension Mother    Hyperlipidemia Father    Hypertension Brother    Mental illness Paternal Grandmother     DRUG ALLERGIES:  No Known Allergies  REVIEW OF SYSTEMS:   ROS As per history of present illness. All pertinent systems were reviewed above. Constitutional, HEENT, cardiovascular, respiratory, GI, GU, musculoskeletal, neuro, psychiatric, endocrine, integumentary and hematologic systems were reviewed and are otherwise negative/unremarkable except for positive findings mentioned above in the HPI.   MEDICATIONS AT HOME:   Prior to Admission medications   Medication Sig Start Date End Date Taking? Authorizing Provider  amLODipine (NORVASC) 10 MG tablet TAKE 1 TABLET  BY MOUTH DAILY 01/31/21  Yes Malva Limes, MD  carbamazepine (TEGRETOL) 200 MG tablet TAKE TWO TABLETS EVERY MORNING AND TAKE TWO TABLETS EVERY EVENING 04/02/21  Yes Eappen, Levin Bacon, MD  valsartan (DIOVAN) 80 MG tablet TAKE 1 TABLET BY MOUTH DAILY. 04/24/21  Yes Malva Limes, MD      VITAL SIGNS:  Blood pressure 121/80, pulse 90, temperature 98.2 F (36.8 C), temperature source Oral, resp. rate 18, height 5\' 10"  (1.778 m), weight 68.7 kg, SpO2 97 %.  PHYSICAL EXAMINATION:  Physical Exam  GENERAL:  65 y.o.-year-old Caucasian male patient lying in the bed with no  acute distress.  He was alert and cooperative. EYES: Pupils equal, round, reactive to light and accommodation. No scleral icterus. Extraocular muscles intact.  HEENT: Head atraumatic, normocephalic. Oropharynx and nasopharynx clear.  NECK:  Supple, no jugular venous distention. No thyroid enlargement, no tenderness.  LUNGS: Normal breath sounds bilaterally, no wheezing, rales,rhonchi or crepitation. No use of accessory muscles of respiration.  CARDIOVASCULAR: Regular rate and rhythm, S1, S2 normal. No murmurs, rubs, or gallops.  ABDOMEN: Soft, nondistended, nontender. Bowel sounds present. No organomegaly or mass.  EXTREMITIES: No pedal edema, cyanosis, or clubbing.  NEUROLOGIC: Cranial nerves II through XII are intact. Muscle strength 5/5 in all extremities. Sensation intact. Gait not checked.  PSYCHIATRIC: Normal affect and good eye contact. SKIN: No obvious rash, lesion, or ulcer.   LABORATORY PANEL:   CBC Recent Labs  Lab 05/01/21 1550  WBC 7.1  HGB 13.2  HCT 35.5*  PLT 243   ------------------------------------------------------------------------------------------------------------------  Chemistries  Recent Labs  Lab 05/01/21 1550  NA 124*  K 4.6  CL 91*  CO2 23  GLUCOSE 114*  BUN 18  CREATININE 0.82  CALCIUM 9.5  AST 26  ALT 30  ALKPHOS 106  BILITOT 0.9   ------------------------------------------------------------------------------------------------------------------  Cardiac Enzymes No results for input(s): TROPONINI in the last 168 hours. ------------------------------------------------------------------------------------------------------------------  RADIOLOGY:  No results found.    IMPRESSION AND PLAN:  Principal Problem:   Bipolar affective disorder, current episode manic (HCC) Active Problems:   Hyponatremia   Essential hypertension  1.  Hyponatremia.  This could be a contributing to his altered mental status.  It could be secondary to  Tegretol. - The patient will be admitted to a medical monitored bed. - He will be hydrated with IV normal saline. - Hyponatremia work-up will be obtained.  2.  Bipolar disorder with manic phase and auditory hallucinations. - Psychiatry follow-up consult will be obtained. - The patient is being continued on Tegretol per Dr. 07/01/21 as well as as needed Ativan for anxiety.  3.  Essential hypertension. - We will continue Norvasc and ARB therapy.  DVT prophylaxis: Lovenox.  Code Status: full code.  Family Communication:  The plan of care was discussed in details with the patient (and family). I answered all questions. The patient agreed to proceed with the above mentioned plan. Further management will depend upon hospital course. Disposition Plan: Back to previous home environment Consults called: Psychiatry. All the records are reviewed and case discussed with ED provider.  Status is: Inpatient  Remains inpatient appropriate because:Altered mental status, Ongoing diagnostic testing needed not appropriate for outpatient work up, Unsafe d/c plan, IV treatments appropriate due to intensity of illness or inability to take PO, and Inpatient level of care appropriate due to severity of illness  Dispo: The patient is from: Home              Anticipated d/c  is to:  Inpatient Psych admission              Patient currently is not medically stable to d/c.   Difficult to place patient No  TOTAL TIME TAKING CARE OF THIS PATIENT: 55 minutes.    Hannah Beat M.D on 05/01/2021 at 8:07 PM  [facility]   From 7 PM-7 AM, contact night-coverage [email or web address]  CC: Primary care physician; Malva Limes, MD

## 2021-05-01 NOTE — ED Provider Notes (Signed)
[facility] Emergency Department Provider Note   ____________________________________________   Event Date/Time   First MD Initiated Contact with Patient 05/01/21 1605     (approximate)  I have reviewed the triage vital signs and the nursing notes.   HISTORY  Chief Complaint Psychiatric Evaluation    HPI Eric Hatfield is a 65 y.o. male who presents for auditory hallucinations  LOCATION: Generalized DURATION: 1 week prior to arrival TIMING: Worsening since onset SEVERITY: Severe QUALITY: Auditory hallucinations CONTEXT: Patient presents at the behest of his wife after calling the psychiatrist and stating that patient has not been sleeping well for the last couple weeks as well as hearing voices and responding to them MODIFYING FACTORS: Denies ASSOCIATED SYMPTOMS: Denies any suicidal ideation, homicidal ideation, auditory/visual hallucinations   Per medical record review, patient does have history of bipolar disorder with psychotic features in the past          Past Medical History:  Diagnosis Date   Bipolar disorder (HCC)    Hypertension     Patient Active Problem List   Diagnosis Date Noted   Bipolar affective disorder, current episode manic (HCC) 05/01/2021   Altered mental status 05/01/2021   Elevated blood pressure reading 12/10/2020   High risk medication use 03/19/2020   Insomnia due to mental disorder 03/19/2020   Systolic murmur 06/30/2019   SIADH (syndrome of inappropriate ADH production) (HCC) 06/28/2019   Bipolar disorder, in full remission, most recent episode depressed (HCC) 05/31/2019   Tobacco use disorder 05/31/2019   Essential hypertension 12/11/2016   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 Authorizing Provider  amLODipine (NORVASC) 10 MG tablet TAKE 1 TABLET BY MOUTH DAILY 01/31/21   Malva Limes, MD  carbamazepine (TEGRETOL) 200 MG tablet TAKE TWO TABLETS EVERY MORNING AND TAKE TWO TABLETS EVERY EVENING 04/02/21   Jomarie Longs, MD  valsartan (DIOVAN) 80 MG tablet TAKE 1 TABLET BY MOUTH DAILY. 04/24/21   Malva Limes, MD    Allergies Patient has no known allergies.  Family History  Problem Relation Age of Onset   Hypertension Mother    Hyperlipidemia Father    Hypertension Brother    Mental illness Paternal Grandmother     Social History Social History   Tobacco Use   Smoking status: Every Day    Packs/day: 0.75    Years: 30.00    Pack years: 22.50    Types: Cigarettes    Start date: 05/31/1975   Smokeless tobacco: Never   Tobacco comments:    started smoking as a teenager average 1 ppd  Vaping Use   Vaping Use: Never used  Substance Use Topics   Alcohol use: No    Alcohol/week: 0.0 standard drinks    Comment: quit drking in his 40s.    Drug use: No    Review of Systems Constitutional: No fever/chills Eyes: No visual changes. ENT: No sore throat. Cardiovascular: Denies chest pain. Respiratory: Denies shortness of breath. Gastrointestinal: No abdominal pain.  No nausea, no vomiting.  No diarrhea. Genitourinary: Negative for dysuria. Musculoskeletal: Negative for acute arthralgias Skin: Negative for rash. Neurological: Negative for headaches, weakness/numbness/paresthesias in any extremity Psychiatric: Endorses auditory hallucinations.  Negative  for suicidal ideation/homicidal ideation   ____________________________________________   PHYSICAL EXAM:  VITAL SIGNS: ED Triage Vitals [05/01/21 1547]  Enc Vitals Group     BP 121/80     Pulse Rate 90     Resp 18     Temp 98.2 F (36.8 C)     Temp Source Oral     SpO2 97 %     Weight 151 lb 7.3 oz (68.7 kg)      Height 5\' 10"  (1.778 m)     Head Circumference      Peak Flow      Pain Score 0     Pain Loc      Pain Edu?      Excl. in GC?    Constitutional: Alert and oriented. Well appearing and in no acute distress. Eyes: Conjunctivae are normal. PERRL. Head: Atraumatic. Nose: No congestion/rhinnorhea. Mouth/Throat: Mucous membranes are moist. Neck: No stridor Cardiovascular: Grossly normal heart sounds.  Good peripheral circulation. Respiratory: Normal respiratory effort.  No retractions. Gastrointestinal: Soft and nontender. No distention. Musculoskeletal: No obvious deformities Neurologic:  Normal speech and language. No gross focal neurologic deficits are appreciated. Skin:  Skin is warm and dry. No rash noted. Psychiatric: Mood and affect are normal. Speech and behavior are normal.  ____________________________________________   LABS (all labs ordered are listed, but only abnormal results are displayed)  Labs Reviewed  COMPREHENSIVE METABOLIC PANEL - Abnormal; Notable for the following components:      Result Value   Sodium 124 (*)    Chloride 91 (*)    Glucose, Bld 114 (*)    All other components within normal limits  SALICYLATE LEVEL - Abnormal; Notable for the following components:   Salicylate Lvl <7.0 (*)    All other components within normal limits  ACETAMINOPHEN LEVEL - Abnormal; Notable for the following components:   Acetaminophen (Tylenol), Serum <10 (*)    All other components within normal limits  CBC - Abnormal; Notable for the following components:   RBC 3.72 (*)    HCT 35.5 (*)    MCH 35.5 (*)    MCHC 37.2 (*)    All other components within normal limits  RESP PANEL BY RT-PCR (FLU A&B, COVID) ARPGX2  ETHANOL  CARBAMAZEPINE LEVEL, TOTAL  URINE DRUG SCREEN, QUALITATIVE ([facility] ONLY)  BASIC METABOLIC PANEL  URINALYSIS, COMPLETE (UACMP) WITH MICROSCOPIC    PROCEDURES  Procedure(s) performed (including Critical  Care):  Procedures   ____________________________________________   INITIAL IMPRESSION / ASSESSMENT AND PLAN / ED COURSE  As part of my medical decision making, I reviewed the following data within the electronic medical record, if available:  Nursing notes reviewed and incorporated, Labs reviewed, EKG interpreted, Old chart reviewed, Radiograph reviewed and Notes from prior ED visits reviewed and incorporated        Patient presents under IVC for hallucinations/delusions. Thoughts are currently organized. No history of prior suicide attempt, and no SI or HI at this time. Clinically w/ no overt toxidrome, low suspicion for ingestion given hx and exam Thoughts unlikely 2/2 anemia, hypothyroidism, infection, or ICH. Patients decision making capacity is compromised and they are unable to perform all ADLs (additionally they are without appropriate caretakers to assist through this deficit).  Consult: Psychiatry to evaluate patient for grave disability Disposition: Inpatient psychiatric admission      ____________________________________________   FINAL CLINICAL IMPRESSION(S) / ED DIAGNOSES  Final diagnoses:  Auditory hallucinations  Psychosis, unspecified psychosis type [facility])     ED  Discharge Orders     None        Note:  This document was prepared using Dragon voice recognition software and may include unintentional dictation errors.    Merwyn Katos, MD 05/01/21 [phone]

## 2021-05-01 NOTE — Progress Notes (Signed)
[facility] MD OP Progress Note  05/01/2021 5:54 PM Eric Hatfield  MRN:  [id number]  Chief Complaint:  Chief Complaint   Follow-up; Anxiety; Altered Mental Status; Hallucinations    HPI: Eric Hatfield is a 65 year old Caucasian male, married, lives in [place], has a history of bipolar disorder, insomnia, tobacco use disorder was evaluated in office today.  This appointment was scheduled yesterday by patient's mother who called the clinic stating patient was not doing well.  Patient today presented along with his mother as well as his wife.  Patient as well as mother came into the office initially and was evaluated by Clinical research associate.  Patient appeared to be confused, tangential could not answer questions appropriately.  Usually patient is able to answer questions, used to be a good historian.  However today's presentation is very different from how he usually presents.  Hence majority of information was obtained from mother.  Mother reports that patient has been psychotic since the past few weeks.  She reports patient is pacing, anxious and is not his self.  He is also not sleeping.  She believes he needs medication changes.  Writer also obtained collateral information from wife -who reported that patient has been talking on the radio, hearing voices coming from the radio since the past 3 weeks or so.  When writer clarified with wife as to whether wife could hear someone speaking on the radio, wife denied and said that he is hallucinating.  However wife also appeared to be a limited historian and could not give more information than that.    Visit Diagnosis:    ICD-10-CM   1. Bipolar 1 disorder, mixed, severe (HCC)  F31.63    with psychosis     2. Altered mental status, unspecified altered mental status type  R41.82       Past Psychiatric History: Reviewed past psychiatric history from progress note on 02/28/2019  Past Medical History:  Past Medical History:  Diagnosis Date   Bipolar disorder  (HCC)    Hypertension     Past Surgical History:  Procedure Laterality Date   Impacted tooth      Family Psychiatric History: Reviewed family psychiatric history from progress note on 02/28/2019  Family History:  Family History  Problem Relation Age of Onset   Hypertension Mother    Hyperlipidemia Father    Hypertension Brother    Mental illness Paternal Grandmother     Social History: Reviewed social history from progress note on 02/28/2019 Social History   Socioeconomic History   Marital status: Married    Spouse name: Not on file   Number of children: Not on file   Years of education: Not on file   Highest education level: Not on file  Occupational History   Not on file  Tobacco Use   Smoking status: Every Day    Packs/day: 0.75    Years: 30.00    Pack years: 22.50    Types: Cigarettes    Start date: 05/31/1975   Smokeless tobacco: Never   Tobacco comments:    started smoking as a teenager average 1 ppd  Vaping Use   Vaping Use: Never used  Substance and Sexual Activity   Alcohol use: No    Alcohol/week: 0.0 standard drinks    Comment: quit drking in his 40s.    Drug use: No   Sexual activity: Yes    Birth control/protection: None  Other Topics Concern   Not on file  Social History Narrative  Not on file   Social Determinants of Health   Financial Resource Strain: Not on file  Food Insecurity: Not on file  Transportation Needs: Not on file  Physical Activity: Not on file  Stress: Not on file  Social Connections: Not on file    Allergies: No Known Allergies  Metabolic Disorder Labs: Lab Results  Component Value Date   HGBA1C 5.8 12/24/2010   No results found for: PROLACTIN Lab Results  Component Value Date   CHOL 211 (H) 06/23/2019   TRIG 40 06/23/2019   HDL 82 06/23/2019   CHOLHDL 2.6 06/23/2019   VLDL 6 04/27/2015   LDLCALC 122 (H) 06/23/2019   LDLCALC 102 (H) 12/11/2016   Lab Results  Component Value Date   TSH 0.896 07/19/2020    TSH 1.500 06/23/2019    Therapeutic Level Labs: No results found for: LITHIUM No results found for: VALPROATE No components found for:  CBMZ  Current Medications: No current facility-administered medications for this visit.   Current Outpatient Medications  Medication Sig Dispense Refill   amLODipine (NORVASC) 10 MG tablet TAKE 1 TABLET BY MOUTH DAILY 30 tablet 5   carbamazepine (TEGRETOL) 200 MG tablet TAKE TWO TABLETS EVERY MORNING AND TAKE TWO TABLETS EVERY EVENING 360 tablet 1   valsartan (DIOVAN) 80 MG tablet TAKE 1 TABLET BY MOUTH DAILY. 90 tablet 0   Facility-Administered Medications Ordered in Other Visits  Medication Dose Route Frequency Provider Last Rate Last Admin   amLODipine (NORVASC) tablet 10 mg  10 mg Oral Daily Clapacs, John T, MD       carbamazepine (EQUETRO) 12 hr capsule 400 mg  400 mg Oral BID Clapacs, John T, MD       irbesartan (AVAPRO) tablet 37.5 mg  37.5 mg Oral Daily Clapacs, [name], MD         Musculoskeletal: Strength & Muscle Tone: within normal limits Gait & Station: normal Patient leans: N/A  Psychiatric Specialty Exam: Review of Systems  Unable to perform ROS: Mental status change   There were no vitals taken for this visit.There is no height or weight on file to calculate BMI.  General Appearance: Casual  Eye Contact:  Fair  Speech:  Normal Rate  Volume:  Decreased  Mood:   unable to verbalize  Affect:  Flat  Thought Process:  Disorganized and Descriptions of Associations: Tangential  Orientation:  Other:  unable to give date , oriented to self, situation  Thought Content: Illogical and Hallucinations: Auditory Visual patient reported he can see his friend coming in to check on him every day, wife reports patient is talking on the radio to this friend and that he can clearly hear someone talking on the radio since he responds to someone-no one else can hear this on the radio about him.  Suicidal Thoughts:   UTA  Homicidal Thoughts:   UTA   Memory:   unable to assess  Judgement:  Impaired  Insight:  Shallow  Psychomotor Activity:  Normal  Concentration:  Concentration: Poor and Attention Span: Poor  Recall:  Poor  Fund of Knowledge: Poor  Language: Fair  Akathisia:  No  Handed:  Right  AIMS (if indicated): not done  Assets:  Housing Social Support Transportation  ADL's:  Intact  Cognition: UTA  Sleep:  Poor   Screenings: PHQ2-9    Flowsheet Row Video Visit from 04/07/2021 in [facility] Office Visit from 12/27/2020 in [facility] Office Visit from 12/10/2020 in [facility] Office Visit from 05/20/2018 in [facility] Office Visit from 12/11/2016 in [facility]  PHQ-2 Total Score 1 0 1 0 0  PHQ-9 Total Score 2 0 -- 0 0      Flowsheet Row ED from 05/01/2021 in [facility] Office Visit from 12/10/2020 in [facility]  C-SSRS RISK CATEGORY No Risk No Risk        Assessment and Plan: ROBBY BULKLEY is a 65 year old Caucasian male, married, employed, lives in [place], has a history of bipolar disorder was evaluated in office today.  Patient was brought into the office by his wife and his mother since patient has been confused, actively hallucinating as well as has insomnia since the past 2 to 3 weeks.  Plan Patient presenting with confusion, tangential, could not clearly give the date, could not give any history today, does report hallucinations however unable to clearly provide details.  Collateral information obtained from wife and mother who reports patient as actively hallucinating, agitated, pacing and not sleeping since the past few days.  Patient will benefit from evaluation in the emergency department as well as lab work-up done, will send him to the emergency department for further management.  Patient as well as wife and mother agrees with  plan.  I have also communicated with our emergency department consultation and liaison team-Dr. Clapacs.  This note was generated in part or whole with voice recognition software. Voice recognition is usually quite accurate but there are transcription errors that can and very often do occur. I apologize for any typographical errors that were not detected and corrected.       Jomarie Longs, MD 05/01/2021, 5:54 PM

## 2021-05-01 NOTE — ED Notes (Addendum)
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

## 2021-05-02 ENCOUNTER — Telehealth: Payer: Self-pay | Admitting: Urology

## 2021-05-02 DIAGNOSIS — F312 Bipolar disorder, current episode manic severe with psychotic features: Secondary | ICD-10-CM | POA: Diagnosis not present

## 2021-05-02 DIAGNOSIS — F29 Unspecified psychosis not due to a substance or known physiological condition: Secondary | ICD-10-CM

## 2021-05-02 LAB — BASIC METABOLIC PANEL
Anion gap: 4 — ABNORMAL LOW (ref 5–15)
BUN: 12 mg/dL (ref 8–23)
CO2: 27 mmol/L (ref 22–32)
Calcium: 8.4 mg/dL — ABNORMAL LOW (ref 8.9–10.3)
Chloride: 98 mmol/L (ref 98–111)
Creatinine, Ser: 0.81 mg/dL (ref 0.61–1.24)
GFR, Estimated: >60 mL/min (ref >60)
Glucose, Bld: 95 mg/dL (ref 70–99)
Potassium: 4.2 mmol/L (ref 3.5–5.1)
Sodium: 129 mmol/L — ABNORMAL LOW (ref 135–145)

## 2021-05-02 LAB — NA AND K (SODIUM & POTASSIUM), RAND UR
Potassium Urine: 25 mmol/L
Sodium, Ur: 19 mmol/L

## 2021-05-02 LAB — CBC
HCT: 32.8 % — ABNORMAL LOW (ref 39.0–52.0)
Hemoglobin: 11.7 g/dL — ABNORMAL LOW (ref 13.0–17.0)
MCH: 34.5 pg — ABNORMAL HIGH (ref 26.0–34.0)
MCHC: 35.7 g/dL (ref 30.0–36.0)
MCV: 96.8 fL (ref 80.0–100.0)
Platelets: 225 K/uL (ref 150–400)
RBC: 3.39 MIL/uL — ABNORMAL LOW (ref 4.22–5.81)
RDW: 12.9 % (ref 11.5–15.5)
WBC: 6.7 K/uL (ref 4.0–10.5)
nRBC: 0 % (ref 0.0–0.2)

## 2021-05-02 LAB — OSMOLALITY, URINE: Osmolality, Ur: 308 mosm/kg (ref 300–900)

## 2021-05-02 LAB — HIV ANTIBODY (ROUTINE TESTING W REFLEX): HIV Screen 4th Generation wRfx: NONREACTIVE

## 2021-05-02 LAB — CORTISOL-AM, BLOOD: Cortisol - AM: 16.6 ug/dL (ref 6.7–22.6)

## 2021-05-02 LAB — TSH: TSH: 1.228 u[IU]/mL (ref 0.350–4.500)

## 2021-05-02 MED ORDER — CARBAMAZEPINE 200 MG PO TABS: 200.0000 mg | ORAL_TABLET | Freq: Two times a day (BID) | ORAL | 0 refills | Status: DC

## 2021-05-02 MED ORDER — QUETIAPINE FUMARATE 100 MG PO TABS: 100.0000 mg | ORAL_TABLET | Freq: Every day | ORAL | 0 refills | Status: DC

## 2021-05-02 MED ORDER — TAMSULOSIN HCL 0.4 MG PO CAPS: 0.4000 mg | ORAL_CAPSULE | Freq: Every day | ORAL | Status: DC

## 2021-05-02 MED ORDER — CARBAMAZEPINE 200 MG PO TABS
200.0000 mg | ORAL_TABLET | Freq: Two times a day (BID) | ORAL | Status: DC
  Filled 2021-05-02: qty 1

## 2021-05-02 MED ORDER — PNEUMOCOCCAL VAC POLYVALENT 25 MCG/0.5ML IJ INJ: 0.5000 mL | INJECTION | INTRAMUSCULAR | Status: DC

## 2021-05-02 MED ORDER — QUETIAPINE FUMARATE 100 MG PO TABS
100.0000 mg | ORAL_TABLET | Freq: Every day | ORAL | Status: DC
  Filled 2021-05-02: qty 1

## 2021-05-02 MED ORDER — TAMSULOSIN HCL 0.4 MG PO CAPS: 0.4000 mg | ORAL_CAPSULE | Freq: Every day | ORAL | 0 refills | Status: DC

## 2021-05-02 NOTE — Telephone Encounter (Signed)
This fellow is being d/c's from the hospital and has a foley for retention with 1 liter.  They started him on tamsulosin.  He needs outpatient follow up in about a week for a voiding trial.  I don't know if he has been seen before.   I was just asked to arrange f/u  Appt made I will contact the patient  Marcelino Duster

## 2021-05-02 NOTE — Discharge Summary (Signed)
Physician Discharge Summary  Eric Hatfield ZOX:[id number] DOB: 1956/03/03 DOA: 05/01/2021  PCP: Malva LimesFisher, Donald E, MD  Admit date: 05/01/2021 Discharge date: 05/02/2021  Admitted From: Home Disposition: Home  Recommendations for Outpatient Follow-up:  Follow up with PCP in 1-2 weeks Follow-up with psychiatry within a week Please obtain BMP/CBC in one week Please follow up on the following pending results: None  Home Health: No Equipment/Devices: None Discharge Condition: Stable CODE STATUS: Full Diet recommendation: Heart Healthy   Brief/Interim Summary: Eric BloodgoodRay A Runde is a 65 y.o. Caucasian male with medical history significant for bipolar disorder and hypertension, who [facility] with altered mental status with auditory hallucination with paranoid ideation.  The patient has not been sleeping well over the last couple weeks and has been hearing voices and talking to people over the radio with no suicidal or homicidal ideation.  The patient was seen by Dr. Toni Amendlapacs who thought he was in current manic episode with his bipolar affective disorder.  The patient initially presented under IVC.  The patient was noted to be hyponatremic and was having urinary retention.   Sodium of 124 on admission.  Rest of the labs were unremarkable.  UDS came back negative.  Hyponatremia labs with decreased serum osmolality, normal urine osmolality, urinary sodium of 19, apparently patient was on carbamazepine and review of chart with chronic hyponatremia with sodium around 128-130 for the past few years.  TSH and a.m. cortisol within normal limit.  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  Carbamazepine can also cause hyponatremia.  Sodium improved to 129 with some IV normal saline.  Patient appears euvolemic.  Psych also evaluated him and plan is to taper him off from carbamazepine.  He was started on  Seroquel and will need a close follow-up with his psychiatrist for change of medications and better control of his underlying psych illness.  Foley catheter was placed for urinary retention which was removed next day to give him a voiding trial.  Initially patient was unable to void and bladder scan showing more than 1 L of urine.  When we were trying to reinsert Foley catheter he was requesting to try 1 more time and was able to void good amount of urine with postvoid urinary volume of 104 cc.  He was started on Flomax.  Dr.Wrenn from urology was also contacted to provide a close outpatient appointment for further recommendations.  Initial plan was to go home with Foley catheter.  Apparently patient does not want to do that and will follow-up with urology for further recommendations.  He will continue with rest of his home medications and follow-up with his primary care provider, urologist and psychiatrist.  Discharge Diagnoses:  Principal Problem:   Bipolar affective disorder, current episode manic (HCC) Active Problems:   Hyponatremia   Essential hypertension   Psychosis (HCC)   Discharge Instructions  Discharge Instructions     Diet - low sodium heart healthy   Complete by: As directed    Discharge instructions   Complete by: As directed    It was pleasure taking care of you. As we discussed our psychiatrist here decrease the dose of carbamazepine from 400 mg / 2 tablets twice a day to 200 mg / 1 tablet twice a day.  They also started you on Seroquel to be taken at night. Please follow-up with your psychiatrist very closely so you can come off of carbamazepine as  it was causing decreased in sodium. As we also discussed that do not drink whole lot of free water, you can use some rehydration fluids for example Gatorade or propel's so your blood will not get very diluted and decrease your sodium. You are also being started on Flomax to help with your urinary symptoms. You are being  discharged with Foley catheter, please follow the directions, I sent a message to urology office to give you an appointment to be seen within a week with Dr. Annabell Howells.  For further management of your urinary symptoms and Foley catheter.   Increase activity slowly   Complete by: As directed       Allergies as of 05/02/2021   No Known Allergies      Medication List     TAKE these medications    amLODipine 10 MG tablet Commonly known as: NORVASC TAKE 1 TABLET BY MOUTH DAILY   carbamazepine 200 MG tablet Commonly known as: TEGRETOL Take 1 tablet (200 mg total) by mouth 2 (two) times daily. What changed: See the new instructions.   QUEtiapine 100 MG tablet Commonly known as: SEROQUEL Take 1 tablet (100 mg total) by mouth at bedtime.   tamsulosin 0.4 MG Caps capsule Commonly known as: FLOMAX Take 1 capsule (0.4 mg total) by mouth daily after supper.   valsartan 80 MG tablet Commonly known as: DIOVAN TAKE 1 TABLET BY MOUTH DAILY.        Follow-up Information     Malva Limes, MD. Schedule an appointment as soon as possible for a visit in 1 week(s).   Specialty: Family Medicine Contact information: [address] [place] [address] [phone]         Bjorn Pippin, MD. Schedule an appointment as soon as possible for a visit in 1 week(s).   Specialty: Urology Contact information: [address] [place] [address] [phone]                No Known Allergies  Consultations: Psychiatry  Procedures/Studies: No results found.  Subjective: Patient was seen and examined today.  Wife at bedside.  He thinks that he is at his baseline and wants to go home.  We discussed about coming off slowly from carbamazepine and using different agent to control his bipolar disorder.  He will make appointment with his psychiatrist to be seen within next few days.  We also discussed not to use a lot of free water as he was doing before  coming to the hospital which can also cause decreased in his sodium. Patient also having intermittent urinary difficulties which include difficulty voiding, starting the stream, sometimes little dribbling.  He would like to get a voiding trial and will follow up with urology as an outpatient for further recommendations.  Discharge Exam: Vitals:   05/02/21 0746 05/02/21 1217  BP: 126/74 126/74  Pulse: 61 63  Resp: 20 18  Temp: (!) 97.4 F (36.3 C) 97.7 F (36.5 C)  SpO2: 98% 97%   Vitals:   05/01/21 2231 05/02/21 0451 05/02/21 0746 05/02/21 1217  BP: 114/70 116/68 126/74 126/74  Pulse: 67 68 61 63  Resp: Temp: 98.2 F (36.8 C) 97.8 F (36.6 C) (!) 97.4 F (36.3 C) 97.7 F (36.5 C)  TempSrc: Oral  Oral   SpO2: 100% 97% 98% 97%  Weight:      Height:        General: Pt is alert, awake, not  in acute distress Cardiovascular: RRR, S1/S2 +, no rubs, no gallops Respiratory: CTA bilaterally, no wheezing, no rhonchi Abdominal: Soft, NT, ND, bowel sounds + Extremities: no edema, no cyanosis   The results of significant diagnostics from this hospitalization (including imaging, microbiology, ancillary and laboratory) are listed below for reference.    Microbiology: Recent Results (from the past 240 hour(s))  Resp Panel by RT-PCR (Flu A&B, Covid) Nasopharyngeal Swab     Status: None   Collection Time: 05/01/21  4:39 PM   Specimen: Nasopharyngeal Swab; Nasopharyngeal(NP) swabs in vial transport medium  Result Value Ref Range Status   SARS Coronavirus 2 by RT PCR NEGATIVE NEGATIVE Final    Comment: (NOTE) SARS-CoV-2 target nucleic acids are NOT DETECTED.  The SARS-CoV-2 RNA is generally detectable in upper respiratory specimens during the acute phase of infection. The lowest concentration of SARS-CoV-2 viral copies this assay can detect is 138 copies/mL. A negative result does not preclude SARS-Cov-2 infection and should not be used as the sole basis for treatment  or other patient management decisions. A negative result may occur with  improper specimen collection/handling, submission of specimen other than nasopharyngeal swab, presence of viral mutation(s) within the areas targeted by this assay, and inadequate number of viral copies(<138 copies/mL). A negative result must be combined with clinical observations, patient history, and epidemiological information. The expected result is Negative.  Fact Sheet for Patients:  BloggerCourse.com  Fact Sheet for Healthcare Providers:  SeriousBroker.it  This test is no t yet approved or cleared by the [place] FDA and  has been authorized for detection and/or diagnosis of SARS-CoV-2 by FDA under an Emergency Use Authorization (EUA). This EUA will remain  in effect (meaning this test can be used) for the duration of the COVID-19 declaration under Section 564(b)(1) of the Act, 21 U.S.C.section 360bbb-3(b)(1), unless the authorization is terminated  or revoked sooner.       Influenza A by PCR NEGATIVE NEGATIVE Final   Influenza B by PCR NEGATIVE NEGATIVE Final    Comment: (NOTE) The Xpert Xpress SARS-CoV-2/FLU/RSV plus assay is intended as an aid in the diagnosis of influenza from Nasopharyngeal swab specimens and should not be used as a sole basis for treatment. Nasal washings and aspirates are unacceptable for Xpert Xpress SARS-CoV-2/FLU/RSV testing.  Fact Sheet for Patients: BloggerCourse.com  Fact Sheet for Healthcare Providers: SeriousBroker.it  This test is not yet approved or cleared by the [place] FDA and has been authorized for detection and/or diagnosis of SARS-CoV-2 by FDA under an Emergency Use Authorization (EUA). This EUA will remain in effect (meaning this test can be used) for the duration of the COVID-19 declaration under Section 564(b)(1) of the Act, 21 U.S.C. section  360bbb-3(b)(1), unless the authorization is terminated or revoked.  Performed at [facility], [address] [place], [place] [address]      Labs: BNP (last 3 results) No results for input(s): BNP in the last 8760 hours. Basic Metabolic Panel: Recent Labs  Lab 05/01/21 1550 05/02/21 0705  NA 124* 129*  K 4.6 4.2  CL 91* 98  CO2 23 27  GLUCOSE 114* 95  BUN 18 12  CREATININE 0.82 0.81  CALCIUM 9.5 8.4*   Liver Function Tests: Recent Labs  Lab 05/01/21 1550  AST 26  ALT 30  ALKPHOS 106  BILITOT 0.9  PROT 7.1  ALBUMIN 4.6   No results for input(s): LIPASE, AMYLASE in the last 168 hours. No results for input(s): AMMONIA in the last  168 hours. CBC: Recent Labs  Lab 05/01/21 1550 05/02/21 0705  WBC 7.1 6.7  HGB 13.2 11.7*  HCT 35.5* 32.8*  MCV 95.4 96.8  PLT 243 225   Cardiac Enzymes: No results for input(s): CKTOTAL, CKMB, CKMBINDEX, TROPONINI in the last 168 hours. BNP: Invalid input(s): POCBNP CBG: No results for input(s): GLUCAP in the last 168 hours. D-Dimer No results for input(s): DDIMER in the last 72 hours. Hgb A1c No results for input(s): HGBA1C in the last 72 hours. Lipid Profile No results for input(s): CHOL, HDL, LDLCALC, TRIG, CHOLHDL, LDLDIRECT in the last 72 hours. Thyroid function studies Recent Labs    05/02/21 0832  TSH 1.228   Anemia work up No results for input(s): VITAMINB12, FOLATE, FERRITIN, TIBC, IRON, RETICCTPCT in the last 72 hours. Urinalysis    Component Value Date/Time   COLORURINE YELLOW (A) 05/01/2021 1820   APPEARANCEUR CLEAR (A) 05/01/2021 1820   LABSPEC 1.018 05/01/2021 1820   PHURINE 5.0 05/01/2021 1820   GLUCOSEU NEGATIVE 05/01/2021 1820   HGBUR SMALL (A) 05/01/2021 1820   BILIRUBINUR NEGATIVE 05/01/2021 1820   KETONESUR NEGATIVE 05/01/2021 1820   PROTEINUR NEGATIVE 05/01/2021 1820   NITRITE NEGATIVE 05/01/2021 1820   LEUKOCYTESUR NEGATIVE 05/01/2021 1820   Sepsis Labs Invalid input(s):  PROCALCITONIN,  WBC,  LACTICIDVEN Microbiology Recent Results (from the past 240 hour(s))  Resp Panel by RT-PCR (Flu A&B, Covid) Nasopharyngeal Swab     Status: None   Collection Time: 05/01/21  4:39 PM   Specimen: Nasopharyngeal Swab; Nasopharyngeal(NP) swabs in vial transport medium  Result Value Ref Range Status   SARS Coronavirus 2 by RT PCR NEGATIVE NEGATIVE Final    Comment: (NOTE) SARS-CoV-2 target nucleic acids are NOT DETECTED.  The SARS-CoV-2 RNA is generally detectable in upper respiratory specimens during the acute phase of infection. The lowest concentration of SARS-CoV-2 viral copies this assay can detect is 138 copies/mL. A negative result does not preclude SARS-Cov-2 infection and should not be used as the sole basis for treatment or other patient management decisions. A negative result may occur with  improper specimen collection/handling, submission of specimen other than nasopharyngeal swab, presence of viral mutation(s) within the areas targeted by this assay, and inadequate number of viral copies(<138 copies/mL). A negative result must be combined with clinical observations, patient history, and epidemiological information. The expected result is Negative.  Fact Sheet for Patients:  BloggerCourse.com  Fact Sheet for Healthcare Providers:  SeriousBroker.it  This test is no t yet approved or cleared by the [place] FDA and  has been authorized for detection and/or diagnosis of SARS-CoV-2 by FDA under an Emergency Use Authorization (EUA). This EUA will remain  in effect (meaning this test can be used) for the duration of the COVID-19 declaration under Section 564(b)(1) of the Act, 21 U.S.C.section 360bbb-3(b)(1), unless the authorization is terminated  or revoked sooner.       Influenza A by PCR NEGATIVE NEGATIVE Final   Influenza B by PCR NEGATIVE NEGATIVE Final    Comment: (NOTE) The Xpert Xpress  SARS-CoV-2/FLU/RSV plus assay is intended as an aid in the diagnosis of influenza from Nasopharyngeal swab specimens and should not be used as a sole basis for treatment. Nasal washings and aspirates are unacceptable for Xpert Xpress SARS-CoV-2/FLU/RSV testing.  Fact Sheet for Patients: BloggerCourse.com  Fact Sheet for Healthcare Providers: SeriousBroker.it  This test is not yet approved or cleared by the [place] FDA and has been authorized for detection and/or diagnosis of SARS-CoV-2 by  FDA under an Emergency Use Authorization (EUA). This EUA will remain in effect (meaning this test can be used) for the duration of the COVID-19 declaration under Section 564(b)(1) of the Act, 21 U.S.C. section 360bbb-3(b)(1), unless the authorization is terminated or revoked.  Performed at [facility], [address] [place], [place] [address]     Time coordinating discharge: Over 30 minutes  SIGNED:  Arnetha Courser, MD  [facility] 05/02/2021, 2:30 PM  If 7PM-7AM, please contact night-coverage [email or web address]  This record has been created using Conservation officer, historic buildings. Errors have been sought and corrected,but may not always be located. Such creation errors do not reflect on the standard of care.

## 2021-05-02 NOTE — Progress Notes (Signed)
Initial Nutrition Assessment  DOCUMENTATION CODES:  Non-severe (moderate) malnutrition in context of chronic illness  INTERVENTION:  Liberalize diet to regular for low Na and malnutrition If pt remains inpatient, Ensure Enlive po TID, each supplement provides 350 kcal and 20 grams of protein MVI with minerals daily  NUTRITION DIAGNOSIS:  Moderate Malnutrition related to social / environmental circumstances as evidenced by mild fat depletion, moderate fat depletion, mild muscle depletion, moderate muscle depletion.  GOAL:  Patient will meet greater than or equal to 90% of their needs  MONITOR:  PO intake, Supplement acceptance, Labs  REASON FOR ASSESSMENT:  Malnutrition Screening Tool    ASSESSMENT:  65 y.o. male with medical history significant for bipolar disorder and hypertension, who presented to ED with altered mental status with auditory hallucination with paranoid ideation. Initially placed under IVC status. Found to be significantly hyponatremic.  Psychiatry evaluated and felt to be improved to the point IVC could be removed 8/12. Remains with significant hyponatremia, but improved from admission.   Pt resting in bed at the time of visit, fidgety with IV site, states he is going home today. Reports he has a good appetite and that he is usually about 160 lbs. On exam, significant fat and muscle deficits noted. Indicative of malnutrition on exam and prolonged poor intake. If pt remains at hospital, would recommend adding nutrition supplements to augment intake.    Average Meal Intake: 8/11-8/12: 100% intake x 1 recorded meals  Nutritionally Relevant Medications: Scheduled Meds:  carbamazepine  200 mg Oral BID   Continuous Infusions:  sodium chloride 100 mL/hr at 05/02/21 0840   PRN Meds: magnesium hydroxide, ondansetron  Labs Reviewed: Na 129  NUTRITION - FOCUSED PHYSICAL EXAM: Flowsheet Row Most Recent Value  Orbital Region Mild depletion  Upper Arm Region  Moderate depletion  Thoracic and Lumbar Region Moderate depletion  Buccal Region Mild depletion  Temple Region Moderate depletion  Clavicle Bone Region Mild depletion  Clavicle and Acromion Bone Region Moderate depletion  Scapular Bone Region Moderate depletion  Dorsal Hand Mild depletion  Patellar Region No depletion  Anterior Thigh Region No depletion  Posterior Calf Region No depletion  Edema (RD Assessment) None  Hair Reviewed  Eyes Reviewed  Mouth Reviewed  Skin Reviewed  Nails Reviewed   Diet Order:   Diet Order             Diet regular Room service appropriate? Yes; Fluid consistency: Thin  Diet effective now           Diet - low sodium heart healthy                   EDUCATION NEEDS:  No education needs have been identified at this time  Skin:  Skin Assessment: Reviewed RN Assessment  Last BM:  8/10 - type 4  Height:  Ht Readings from Last 1 Encounters:  05/01/21 5\' 10"  (1.778 m)   Weight:  Wt Readings from Last 1 Encounters:  05/01/21 68.7 kg    Ideal Body Weight:  75.5 kg  BMI:  Body mass index is 21.73 kg/m.  Estimated Nutritional Needs:  Kcal:  1800-2000 kcal/d Protein:  90-100g/d Fluid:  2 L/d  07/01/21, RD, LDN Clinical Dietitian Pager on Amion

## 2021-05-02 NOTE — Consult Note (Signed)
[facility] Face-to-Face Psychiatry Consult   Reason for Consult: Consult for this patient being followed up for his bipolar disorder from yesterday. Referring Physician: Nelson Chimes Patient Identification: CLOIS Hatfield MRN:  [id number] Principal Diagnosis: Bipolar affective disorder, current episode manic (HCC) Diagnosis:  Principal Problem:   Bipolar affective disorder, current episode manic (HCC) Active Problems:   Hyponatremia   Essential hypertension   Total Time spent with patient: 1 hour  Subjective:   Eric Hatfield is a 65 y.o. male patient admitted with "I am feeling better".  HPI: Patient seen chart reviewed.  Spoke with wife in person.  Spoke with hospitalist.  Also have communicated with outpatient psychiatrist this morning.  Patient's mental status is much improved.  His speech is completely coherent and he is speaking at a normal rate and tone.  Sentences hold together.  He is oriented to situation and place.  I did notice there still seems to be some confusion and possibly some psychotic symptoms.  When asked to talk about how he had been doing recently he starts rambling about his cell phone being changed and none of it makes much sense.  He is not aggressive and he has been cooperative with treatment.  Sodium is improved this morning up to 129.  Urine came back with no sign of infection.  Foley catheter still in place.  Past Psychiatric History: Long history of bipolar disorder.  Tegretol has been his primary medicine with good results for his mental health for years but unfortunately seems to be the most likely cause of his chronic hyponatremia.  Patient and wife could only remember Navane as a prior psychiatric medicine and he remembered lithium from a very long time ago.  Risk to Self:   Risk to Others:   Prior Inpatient Therapy:   Prior Outpatient Therapy:    Past Medical History:  Past Medical History:  Diagnosis Date   Bipolar disorder (HCC)    Hypertension     Past  Surgical History:  Procedure Laterality Date   Impacted tooth     Family History:  Family History  Problem Relation Age of Onset   Hypertension Mother    Hyperlipidemia Father    Hypertension Brother    Mental illness Paternal Grandmother    Family Psychiatric  History: See notes.  There is a family history of bipolar disorder. Social History:  Social History   Substance and Sexual Activity  Alcohol Use No   Alcohol/week: 0.0 standard drinks   Comment: quit drking in his 63s.      Social History   Substance and Sexual Activity  Drug Use No    Social History   Socioeconomic History   Marital status: Married    Spouse name: Not on file   Number of children: Not on file   Years of education: Not on file   Highest education level: Not on file  Occupational History   Not on file  Tobacco Use   Smoking status: Every Day    Packs/day: 0.75    Years: 30.00    Pack years: 22.50    Types: Cigarettes    Start date: 05/31/1975   Smokeless tobacco: Never   Tobacco comments:    started smoking as a teenager average 1 ppd  Vaping Use   Vaping Use: Never used  Substance and Sexual Activity   Alcohol use: No    Alcohol/week: 0.0 standard drinks    Comment: quit drking in his 40s.    Drug use:  No   Sexual activity: Yes    Birth control/protection: None  Other Topics Concern   Not on file  Social History Narrative   Not on file   Social Determinants of Health   Financial Resource Strain: Not on file  Food Insecurity: Not on file  Transportation Needs: Not on file  Physical Activity: Not on file  Stress: Not on file  Social Connections: Not on file   Additional Social History:    Allergies:  No Known Allergies  Labs:  Results for orders placed or performed during the hospital encounter of 05/01/21 (from the past 48 hour(s))  Comprehensive metabolic panel     Status: Abnormal   Collection Time: 05/01/21  3:50 PM  Result Value Ref Range   Sodium 124 (L) 135 -  145 mmol/L   Potassium 4.6 3.5 - 5.1 mmol/L   Chloride 91 (L) 98 - 111 mmol/L   CO2 23 22 - 32 mmol/L   Glucose, Bld 114 (H) 70 - 99 mg/dL    Comment: Glucose reference range applies only to samples taken after fasting for at least 8 hours.   BUN 18 8 - 23 mg/dL   Creatinine, Ser 3.89 0.61 - 1.24 mg/dL   Calcium 9.5 8.9 - 37.3 mg/dL   Total Protein 7.1 6.5 - 8.1 g/dL   Albumin 4.6 3.5 - 5.0 g/dL   AST 26 15 - 41 U/L   ALT 30 0 - 44 U/L   Alkaline Phosphatase 106 38 - 126 U/L   Total Bilirubin 0.9 0.3 - 1.2 mg/dL   GFR, Estimated >42 >87 mL/min    Comment: (NOTE) Calculated using the CKD-EPI Creatinine Equation (2021)    Anion gap 10 5 - 15    Comment: Performed at [facility], [address] [place], [place] [address]  Ethanol     Status: None   Collection Time: 05/01/21  3:50 PM  Result Value Ref Range   Alcohol, Ethyl (B) <10 <10 mg/dL    Comment: (NOTE) Lowest detectable limit for serum alcohol is 10 mg/dL.  For medical purposes only. Performed at [facility], [address] [place], [place] [address]   Salicylate level     Status: Abnormal   Collection Time: 05/01/21  3:50 PM  Result Value Ref Range   Salicylate Lvl <7.0 (L) 7.0 - 30.0 mg/dL    Comment: Performed at [facility], [address] [place], [place] [address]  Acetaminophen level     Status: Abnormal   Collection Time: 05/01/21  3:50 PM  Result Value Ref Range   Acetaminophen (Tylenol), Serum <10 (L) 10 - 30 ug/mL    Comment: (NOTE) Therapeutic concentrations vary significantly. A range of 10-30 ug/mL  may be an effective concentration for many patients. However, some  are best treated at concentrations outside of this range. Acetaminophen concentrations >150 ug/mL at 4 hours after ingestion  and >50 ug/mL at 12 hours after ingestion are often associated with  toxic reactions.  Performed at [facility], [address] [place], [place] [address]   cbc      Status: Abnormal   Collection Time: 05/01/21  3:50 PM  Result Value Ref Range   WBC 7.1 4.0 - 10.5 K/uL   RBC 3.72 (L) 4.22 - 5.81 MIL/uL   Hemoglobin 13.2 13.0 - 17.0 g/dL   HCT 45.3 (L) 64.6 - 80.3 %   MCV 95.4 80.0 - 100.0 fL   MCH 35.5 (H) 26.0 -  34.0 pg   MCHC 37.2 (H) 30.0 - 36.0 g/dL   RDW 97.5 88.3 - 25.4 %   Platelets 243 150 - 400 K/uL   nRBC 0.0 0.0 - 0.2 %    Comment: Performed at [facility], [address] [place], [place] [address]  Carbamazepine level, total     Status: None   Collection Time: 05/01/21  3:50 PM  Result Value Ref Range   Carbamazepine Lvl 10.2 4.0 - 12.0 ug/mL    Comment: Performed at [facility], [address] [place], [place] [address]  Osmolality     Status: Abnormal   Collection Time: 05/01/21  3:50 PM  Result Value Ref Range   Osmolality 263 (L) 275 - 295 mOsm/kg    Comment: REPEATED TO VERIFY Performed at [facility], [address] [place], [place] [address]   Resp Panel by RT-PCR (Flu A&B, Covid) Nasopharyngeal Swab     Status: None   Collection Time: 05/01/21  4:39 PM   Specimen: Nasopharyngeal Swab; Nasopharyngeal(NP) swabs in vial transport medium  Result Value Ref Range   SARS Coronavirus 2 by RT PCR NEGATIVE NEGATIVE    Comment: (NOTE) SARS-CoV-2 target nucleic acids are NOT DETECTED.  The SARS-CoV-2 RNA is generally detectable in upper respiratory specimens during the acute phase of infection. The lowest concentration of SARS-CoV-2 viral copies this assay can detect is 138 copies/mL. A negative result does not preclude SARS-Cov-2 infection and should not be used as the sole basis for treatment or other patient management decisions. A negative result may occur with  improper specimen collection/handling, submission of specimen other than nasopharyngeal swab, presence of viral mutation(s) within the areas targeted by this assay, and inadequate number of viral copies(<138 copies/mL). A negative  result must be combined with clinical observations, patient history, and epidemiological information. The expected result is Negative.  Fact Sheet for Patients:  BloggerCourse.com  Fact Sheet for Healthcare Providers:  SeriousBroker.it  This test is no t yet approved or cleared by the [place] FDA and  has been authorized for detection and/or diagnosis of SARS-CoV-2 by FDA under an Emergency Use Authorization (EUA). This EUA will remain  in effect (meaning this test can be used) for the duration of the COVID-19 declaration under Section 564(b)(1) of the Act, 21 U.S.C.section 360bbb-3(b)(1), unless the authorization is terminated  or revoked sooner.       Influenza A by PCR NEGATIVE NEGATIVE   Influenza B by PCR NEGATIVE NEGATIVE    Comment: (NOTE) The Xpert Xpress SARS-CoV-2/FLU/RSV plus assay is intended as an aid in the diagnosis of influenza from Nasopharyngeal swab specimens and should not be used as a sole basis for treatment. Nasal washings and aspirates are unacceptable for Xpert Xpress SARS-CoV-2/FLU/RSV testing.  Fact Sheet for Patients: BloggerCourse.com  Fact Sheet for Healthcare Providers: SeriousBroker.it  This test is not yet approved or cleared by the [place] FDA and has been authorized for detection and/or diagnosis of SARS-CoV-2 by FDA under an Emergency Use Authorization (EUA). This EUA will remain in effect (meaning this test can be used) for the duration of the COVID-19 declaration under Section 564(b)(1) of the Act, 21 U.S.C. section 360bbb-3(b)(1), unless the authorization is terminated or revoked.  Performed at [facility] [address] [place], [place] [address]   Urine Drug Screen, Qualitative     Status: None   Collection Time: 05/01/21  6:20 PM  Result Value Ref Range   Tricyclic, Ur Screen NONE  DETECTED NONE DETECTED    Amphetamines, Ur Screen NONE DETECTED NONE DETECTED   MDMA (Ecstasy)Ur Screen NONE DETECTED NONE DETECTED   Cocaine Metabolite,Ur [place] NONE DETECTED NONE DETECTED   Opiate, Ur Screen NONE DETECTED NONE DETECTED   Phencyclidine (PCP) Ur S NONE DETECTED NONE DETECTED   Cannabinoid 50 Ng, Ur [place] NONE DETECTED NONE DETECTED   Barbiturates, Ur Screen NONE DETECTED NONE DETECTED   Benzodiazepine, Ur Scrn NONE DETECTED NONE DETECTED   Methadone Scn, Ur NONE DETECTED NONE DETECTED    Comment: (NOTE) Tricyclics + metabolites, urine    Cutoff 1000 ng/mL Amphetamines + metabolites, urine  Cutoff 1000 ng/mL MDMA (Ecstasy), urine              Cutoff 500 ng/mL Cocaine Metabolite, urine          Cutoff 300 ng/mL Opiate + metabolites, urine        Cutoff 300 ng/mL Phencyclidine (PCP), urine         Cutoff 25 ng/mL Cannabinoid, urine                 Cutoff 50 ng/mL Barbiturates + metabolites, urine  Cutoff 200 ng/mL Benzodiazepine, urine              Cutoff 200 ng/mL Methadone, urine                   Cutoff 300 ng/mL  The urine drug screen provides only a preliminary, unconfirmed analytical test result and should not be used for non-medical purposes. Clinical consideration and professional judgment should be applied to any positive drug screen result due to possible interfering substances. A more specific alternate chemical method must be used in order to obtain a confirmed analytical result. Gas chromatography / mass spectrometry (GC/MS) is the preferred confirm atory method. Performed at [facility], [address] [place], [place] [address]   Urinalysis, Complete w Microscopic Urine, Catheterized     Status: Abnormal   Collection Time: 05/01/21  6:20 PM  Result Value Ref Range   Color, Urine YELLOW (A) YELLOW   APPearance CLEAR (A) CLEAR   Specific Gravity, Urine 1.018 1.005 - 1.030   pH 5.0 5.0 - 8.0   Glucose, UA NEGATIVE NEGATIVE mg/dL   Hgb urine dipstick SMALL (A) NEGATIVE    Bilirubin Urine NEGATIVE NEGATIVE   Ketones, ur NEGATIVE NEGATIVE mg/dL   Protein, ur NEGATIVE NEGATIVE mg/dL   Nitrite NEGATIVE NEGATIVE   Leukocytes,Ua NEGATIVE NEGATIVE   RBC / HPF 6-10 0 - 5 RBC/hpf   WBC, UA 0-5 0 - 5 WBC/hpf   Bacteria, UA RARE (A) NONE SEEN   Squamous Epithelial / LPF NONE SEEN 0 - 5   Mucus PRESENT     Comment: Performed at [facility], [address] [place], [place] [address]  Na and K (sodium & potassium), rand urine     Status: None   Collection Time: 05/02/21  3:36 AM  Result Value Ref Range   Sodium, Ur 19 mmol/L   Potassium Urine 25 mmol/L    Comment: Performed at [facility], [address] [place], [place] [address]  Osmolality, urine     Status: None   Collection Time: 05/02/21  3:36 AM  Result Value Ref Range   Osmolality, Ur 308 300 - 900 mOsm/kg    Comment: Performed at [facility], [address] [place], [place] [address]  Basic metabolic panel     Status: Abnormal  Collection Time: 05/02/21  7:05 AM  Result Value Ref Range   Sodium 129 (L) 135 - 145 mmol/L   Potassium 4.2 3.5 - 5.1 mmol/L   Chloride 98 98 - 111 mmol/L   CO2 27 22 - 32 mmol/L   Glucose, Bld 95 70 - 99 mg/dL    Comment: Glucose reference range applies only to samples taken after fasting for at least 8 hours.   BUN 12 8 - 23 mg/dL   Creatinine, Ser 1.610.81 0.61 - 1.24 mg/dL   Calcium 8.4 (L) 8.9 - 10.3 mg/dL   GFR, Estimated >09>60 >60>60 mL/min    Comment: (NOTE) Calculated using the CKD-EPI Creatinine Equation (2021)    Anion gap 4 (L) 5 - 15    Comment: Performed at [facility], [address] [place], [place] [address]  CBC     Status: Abnormal   Collection Time: 05/02/21  7:05 AM  Result Value Ref Range   WBC 6.7 4.0 - 10.5 K/uL   RBC 3.39 (L) 4.22 - 5.81 MIL/uL   Hemoglobin 11.7 (L) 13.0 - 17.0 g/dL   HCT 81.132.8 (L) 91.439.0 - 78.252.0 %   MCV 96.8 80.0 - 100.0 fL   MCH 34.5 (H) 26.0 - 34.0 pg   MCHC 35.7 30.0 - 36.0 g/dL   RDW  95.612.9 21.311.5 - 08.615.5 %   Platelets 225 150 - 400 K/uL   nRBC 0.0 0.0 - 0.2 %    Comment: Performed at [facility], [address] [place], [place] [address]  TSH     Status: None   Collection Time: 05/02/21  8:32 AM  Result Value Ref Range   TSH 1.228 0.350 - 4.500 uIU/mL    Comment: Performed by a 3rd Generation assay with a functional sensitivity of <=0.01 uIU/mL. Performed at [facility], [address] [place], [place] [address]     Current Facility-Administered Medications  Medication Dose Route Frequency Provider Last Rate Last Admin   0.9 %  sodium chloride infusion   Intravenous Continuous Mansy, Jan A, MD 100 mL/hr at 05/02/21 0840 New Bag at 05/02/21 0840   acetaminophen (TYLENOL) tablet 650 mg  650 mg Oral Q6H PRN Mansy, Jan A, MD       Or   acetaminophen (TYLENOL) suppository 650 mg  650 mg Rectal Q6H PRN Mansy, Jan A, MD       amLODipine (NORVASC) tablet 10 mg  10 mg Oral Daily Mansy, Jan A, MD   10 mg at 05/02/21 [id number]   carbamazepine (TEGRETOL) tablet 200 mg  200 mg Oral BID Maebry Obrien T, MD       enoxaparin (LOVENOX) injection 40 mg  40 mg Subcutaneous Q24H Mansy, Jan A, MD   40 mg at 05/01/21 2237   irbesartan (AVAPRO) tablet 37.5 mg  37.5 mg Oral Daily Mansy, Jan A, MD   37.5 mg at 05/02/21 0930   LORazepam (ATIVAN) tablet 2 mg  2 mg Oral Q6H PRN Mansy, Jan A, MD   2 mg at 05/01/21 1828   magnesium hydroxide (MILK OF MAGNESIA) suspension 30 mL  30 mL Oral Daily PRN Mansy, Jan A, MD       ondansetron [facility](ZOFRAN) tablet 4 mg  4 mg Oral Q6H PRN Mansy, Jan A, MD       Or   ondansetron [facility](ZOFRAN) injection 4 mg  4 mg Intravenous Q6H PRN Mansy, Vernetta HoneyJan A, MD       [START ON 05/03/2021] pneumococcal 23  valent vaccine (PNEUMOVAX-23) injection 0.5 mL  0.5 mL Intramuscular Tomorrow-1000 Mansy, Jan A, MD       QUEtiapine (SEROQUEL) tablet 100 mg  100 mg Oral QHS Mistina Coatney T, MD       traZODone (DESYREL) tablet 25 mg  25 mg Oral QHS PRN Mansy, Vernetta Honey, MD         Musculoskeletal: Strength & Muscle Tone: within normal limits Gait & Station:  Not assessed Patient leans:  Not assessed            Psychiatric Specialty Exam:  Presentation  General Appearance:  No data recorded Eye Contact: No data recorded Speech: No data recorded Speech Volume: No data recorded Handedness: No data recorded  Mood and Affect  Mood: No data recorded Affect: No data recorded  Thought Process  Thought Processes: No data recorded Descriptions of Associations:No data recorded Orientation:No data recorded Thought Content:No data recorded History of Schizophrenia/Schizoaffective disorder:No data recorded Duration of Psychotic Symptoms:No data recorded Hallucinations:No data recorded Ideas of Reference:No data recorded Suicidal Thoughts:No data recorded Homicidal Thoughts:No data recorded  Sensorium  Memory: No data recorded Judgment: No data recorded Insight: No data recorded  Executive Functions  Concentration: No data recorded Attention Span: No data recorded Recall: No data recorded Fund of Knowledge: No data recorded Language: No data recorded  Psychomotor Activity  Psychomotor Activity: No data recorded  Assets  Assets: No data recorded  Sleep  Sleep: No data recorded  Physical Exam: Physical Exam Vitals and nursing note reviewed.  Constitutional:      Appearance: Normal appearance.  HENT:     Head: Normocephalic and atraumatic.     Mouth/Throat:     Pharynx: Oropharynx is clear.  Eyes:     Pupils: Pupils are equal, round, and reactive to light.  Cardiovascular:     Rate and Rhythm: Normal rate and regular rhythm.  Pulmonary:     Effort: Pulmonary effort is normal.     Breath sounds: Normal breath sounds.  Abdominal:     General: Abdomen is flat.     Palpations: Abdomen is soft.  Genitourinary:   Musculoskeletal:        General: Normal range of motion.  Skin:    General: Skin is warm and  dry.  Neurological:     General: No focal deficit present.     Mental Status: He is alert. Mental status is at baseline.  Psychiatric:        Attention and Perception: Attention normal.        Mood and Affect: Mood is anxious.        Speech: Speech normal.        Behavior: Behavior is not aggressive or hyperactive.        Thought Content: Thought content is delusional. Thought content does not include homicidal or suicidal ideation.        Cognition and Memory: Memory is impaired.   Review of Systems  Constitutional: Negative.   HENT: Negative.    Eyes: Negative.   Respiratory: Negative.    Cardiovascular: Negative.   Gastrointestinal: Negative.   Musculoskeletal: Negative.   Skin: Negative.   Neurological: Negative.   Psychiatric/Behavioral:  Positive for memory loss. Negative for depression, hallucinations, substance abuse and suicidal ideas. The patient is nervous/anxious and has insomnia.   Blood pressure 126/74, pulse 61, temperature (!) 97.4 F (36.3 C), temperature source Oral, resp. rate 20, height  (1.778 m), weight 68.7 kg, SpO2 98 %. Body mass index  is 21.73 kg/m.  Treatment Plan Summary: Medication management and Plan multiple factors here.  His mental health is quite a bit better already which makes me suspect that a significant part of what was going on was more delirium than strictly bipolar disorder.  Most likely cause of the delirium was the low sodium although it is not clear at all whether the urinary retention could have been the cause of some delirium or could have itself been caused by his mental status change.  I agree that this episode suggests that it is worth changing his bipolar medicine.  Reviewed options with patient and wife.  We will cut carbamazepine dose in half today and start Seroquel 100 mg at night.  I advised him of potential risks and side effects.  Advised him that the plan would be to try to increase Seroquel up to 2 or 300 mg at night at  least and then simultaneously discontinue the Tegretol.  Reviewed with hospitalist the rest of the plan.  I think it would be a good idea to address whether the Foley catheter really needs to still be there at discharge or not and whether he needs to see urology or not.  Orders placed.  Also discontinued the paperwork for the involuntary commitment as he is now fully cooperative.  Does not need a sitter.  I will ask psychiatry consult service to follow up with him over the weekend and have updated the outpatient provider.  Disposition: Patient does not meet criteria for psychiatric inpatient admission. See note above for treatment plan  Mordecai Rasmussen, MD 05/02/2021 11:25 AM

## 2021-05-12 ENCOUNTER — Ambulatory Visit: Payer: BC Managed Care – PPO | Admitting: Urology

## 2021-05-13 ENCOUNTER — Ambulatory Visit: Payer: BC Managed Care – PPO | Admitting: Family Medicine

## 2021-05-14 ENCOUNTER — Ambulatory Visit: Payer: BC Managed Care – PPO | Admitting: Urology

## 2021-05-14 ENCOUNTER — Encounter: Payer: Self-pay | Admitting: Family Medicine

## 2021-05-14 ENCOUNTER — Other Ambulatory Visit: Payer: Self-pay

## 2021-05-14 ENCOUNTER — Ambulatory Visit: Payer: BC Managed Care – PPO | Admitting: Family Medicine

## 2021-05-14 VITALS — BP 100/66 | HR 70 | Temp 98.1°F | Resp 20 | Wt 133.0 lb

## 2021-05-14 DIAGNOSIS — E871 Hypo-osmolality and hyponatremia: Secondary | ICD-10-CM | POA: Diagnosis not present

## 2021-05-14 DIAGNOSIS — R634 Abnormal weight loss: Secondary | ICD-10-CM

## 2021-05-14 DIAGNOSIS — F29 Unspecified psychosis not due to a substance or known physiological condition: Secondary | ICD-10-CM | POA: Diagnosis not present

## 2021-05-14 DIAGNOSIS — F3112 Bipolar disorder, current episode manic without psychotic features, moderate: Secondary | ICD-10-CM

## 2021-05-14 DIAGNOSIS — Z125 Encounter for screening for malignant neoplasm of prostate: Secondary | ICD-10-CM

## 2021-05-14 NOTE — Progress Notes (Signed)
Established patient visit   Patient: Eric Hatfield   DOB: 02/08/1956   65 y.o. Male  MRN: [id number] Visit Date: 05/14/2021  Today's healthcare provider: Mila Merry, MD   Chief Complaint  Patient presents with   Hospitalization Follow-up    Subjective  -------------------------------------------------------------------------------------------------------------------- HPI  Follow up Hospitalization  Patient was admitted to [facility] on 05/01/2021 and discharged on 05/02/2021. He was treated for auditory hallucinations, psychosis, and bipolar disorder and hyponatremia. Treatment for this included starting Seroquel and plan to wean carbamazepine due to hyponatremia.  He also had urinary retention and started on tamsulosin. He was advised to follow up with urology but his follow up was cancelled. Telephone follow up was not done. He reports good compliance with treatment. Per discharge summary, patient is to Follow-up with psychiatry within a week He is here with his mother today and reports this condition is a little bit better.   He is also concerned about steady weight loss for several months.  Wt Readings from Last 3 Encounters:  05/14/21 133 lb (60.3 kg)  05/01/21 151 lb 7.3 oz (68.7 kg)  12/27/20 151 lb 6.4 oz (68.7 kg)    -----------------------------------------------------------------------------------------     Medications: Outpatient Medications Prior to Visit  Medication Sig   amLODipine (NORVASC) 10 MG tablet TAKE 1 TABLET BY MOUTH DAILY   carbamazepine (TEGRETOL) 200 MG tablet Take 1 tablet (200 mg total) by mouth 2 (two) times daily.   QUEtiapine (SEROQUEL) 100 MG tablet Take 1 tablet (100 mg total) by mouth at bedtime.   tamsulosin (FLOMAX) 0.4 MG CAPS capsule Take 1 capsule (0.4 mg total) by mouth daily after supper.   valsartan (DIOVAN) 80 MG tablet TAKE 1 TABLET BY MOUTH DAILY.   No facility-administered medications prior to visit.    Review of  Systems  Constitutional:  Negative for appetite change, chills and fever.  Respiratory:  Negative for chest tightness, shortness of breath and wheezing.   Cardiovascular:  Negative for chest pain and palpitations.  Gastrointestinal:  Negative for abdominal pain, nausea and vomiting.      Objective  -------------------------------------------------------------------------------------------------------------------- BP 100/66 (BP Location: Left Arm, Patient Position: Sitting, Cuff Size: Normal)   Pulse 70   Temp 98.1 F (36.7 C) (Temporal)   Resp 20   Wt 133 lb (60.3 kg)   BMI 19.08 kg/m     Physical Exam   General: Appearance:    Thin male in no acute distress  Eyes:    PERRL, conjunctiva/corneas clear, EOM's intact       Lungs:     Clear to auscultation bilaterally, respirations unlabored  Heart:    Normal heart rate. Normal rhythm.  3/6 systolic murmur   MS:   All extremities are intact.    Neurologic:   Awake, alert, oriented x 3. No apparent focal neurological defect.        Assessment & Plan  ---------------------------------------------------------------------------------------------------------------------- 1. Bipolar affective disorder, currently manic, moderate (HCC)   2. Psychosis, unspecified psychosis type (HCC) Relatively stable by his mother's report since discharge after IVC earlier this month.  Has follow up with Dr. Elna Breslow on 05-28-2021   3. Hyponatremia Secondary to carbamazepine. Per discharge summary psychiatry is considering weaning carbamazepine.  - CBC - Renal function panel  4. Prostate cancer screening  - PSA  5. Unexplained weight loss  - Amylase - Protein Electrophoresis, (serum)   He has consistently refused routine colon cancer screening for years. Advised that if labs are normal  he will need evaluation by gastroenterology.      The entirety of the information documented in the History of Present Illness, Review of Systems and Physical  Exam were personally obtained by me. Portions of this information were initially documented by the CMA and reviewed by me for thoroughness and accuracy.     Mila Merry, MD  [facility] [phone] (phone) [phone] (fax)  [facility]

## 2021-05-15 LAB — RENAL FUNCTION PANEL
Albumin: 4 g/dL (ref 3.8–4.8)
BUN/Creatinine Ratio: 16 (ref 10–24)
BUN: 12 mg/dL (ref 8–27)
CO2: 23 mmol/L (ref 20–29)
Calcium: 9 mg/dL (ref 8.6–10.2)
Chloride: 94 mmol/L — ABNORMAL LOW (ref 96–106)
Creatinine, Ser: 0.73 mg/dL — ABNORMAL LOW (ref 0.76–1.27)
Glucose: 102 mg/dL — ABNORMAL HIGH (ref 65–99)
Phosphorus: 3.7 mg/dL (ref 2.8–4.1)
Potassium: 4.4 mmol/L (ref 3.5–5.2)
Sodium: 131 mmol/L — ABNORMAL LOW (ref 134–144)
eGFR: 102 mL/min/{1.73_m2} (ref >59)

## 2021-05-15 LAB — PROTEIN ELECTROPHORESIS, SERUM
A/G Ratio: 1.6 (ref 0.7–1.7)
Albumin ELP: 3.7 g/dL (ref 2.9–4.4)
Alpha 1: 0.2 g/dL (ref 0.0–0.4)
Alpha 2: 0.7 g/dL (ref 0.4–1.0)
Beta: 0.9 g/dL (ref 0.7–1.3)
Gamma Globulin: 0.5 g/dL (ref 0.4–1.8)
Globulin, Total: 2.3 g/dL (ref 2.2–3.9)
Total Protein: 6 g/dL (ref 6.0–8.5)

## 2021-05-15 LAB — CBC
Hematocrit: 33.8 % — ABNORMAL LOW (ref 37.5–51.0)
Hemoglobin: 12 g/dL — ABNORMAL LOW (ref 13.0–17.7)
MCH: 33.5 pg — ABNORMAL HIGH (ref 26.6–33.0)
MCHC: 35.5 g/dL (ref 31.5–35.7)
MCV: 94 fL (ref 79–97)
Platelets: 257 10*3/uL (ref 150–450)
RBC: 3.58 x10E6/uL — ABNORMAL LOW (ref 4.14–5.80)
RDW: 12 % (ref 11.6–15.4)
WBC: 6.7 10*3/uL (ref 3.4–10.8)

## 2021-05-15 LAB — AMYLASE: Amylase: 56 U/L (ref 31–110)

## 2021-05-15 LAB — PSA: Prostate Specific Ag, Serum: 0.8 ng/mL (ref 0.0–4.0)

## 2021-05-17 ENCOUNTER — Encounter: Payer: Self-pay | Admitting: Emergency Medicine

## 2021-05-17 ENCOUNTER — Emergency Department
Admission: EM | Admit: 2021-05-17 | Discharge: 2021-05-19 | Payer: BC Managed Care – PPO | Attending: Emergency Medicine | Admitting: Emergency Medicine

## 2021-05-17 ENCOUNTER — Other Ambulatory Visit: Payer: Self-pay

## 2021-05-17 ENCOUNTER — Emergency Department: Payer: BC Managed Care – PPO

## 2021-05-17 DIAGNOSIS — I10 Essential (primary) hypertension: Secondary | ICD-10-CM | POA: Insufficient documentation

## 2021-05-17 DIAGNOSIS — M7989 Other specified soft tissue disorders: Secondary | ICD-10-CM | POA: Insufficient documentation

## 2021-05-17 DIAGNOSIS — S62336A Displaced fracture of neck of fifth metacarpal bone, right hand, initial encounter for closed fracture: Secondary | ICD-10-CM | POA: Diagnosis not present

## 2021-05-17 DIAGNOSIS — R5381 Other malaise: Secondary | ICD-10-CM | POA: Diagnosis not present

## 2021-05-17 DIAGNOSIS — F29 Unspecified psychosis not due to a substance or known physiological condition: Secondary | ICD-10-CM | POA: Diagnosis not present

## 2021-05-17 DIAGNOSIS — Y900 Blood alcohol level of less than 20 mg/100 ml: Secondary | ICD-10-CM | POA: Diagnosis not present

## 2021-05-17 DIAGNOSIS — Z20822 Contact with and (suspected) exposure to covid-19: Secondary | ICD-10-CM | POA: Insufficient documentation

## 2021-05-17 DIAGNOSIS — Z79899 Other long term (current) drug therapy: Secondary | ICD-10-CM | POA: Diagnosis not present

## 2021-05-17 DIAGNOSIS — F3112 Bipolar disorder, current episode manic without psychotic features, moderate: Secondary | ICD-10-CM

## 2021-05-17 DIAGNOSIS — W228XXA Striking against or struck by other objects, initial encounter: Secondary | ICD-10-CM | POA: Insufficient documentation

## 2021-05-17 DIAGNOSIS — E871 Hypo-osmolality and hyponatremia: Secondary | ICD-10-CM | POA: Diagnosis present

## 2021-05-17 DIAGNOSIS — F3110 Bipolar disorder, current episode manic without psychotic features, unspecified: Secondary | ICD-10-CM | POA: Diagnosis present

## 2021-05-17 DIAGNOSIS — R451 Restlessness and agitation: Secondary | ICD-10-CM | POA: Diagnosis not present

## 2021-05-17 DIAGNOSIS — S62339A Displaced fracture of neck of unspecified metacarpal bone, initial encounter for closed fracture: Secondary | ICD-10-CM

## 2021-05-17 DIAGNOSIS — Z87891 Personal history of nicotine dependence: Secondary | ICD-10-CM | POA: Diagnosis not present

## 2021-05-17 DIAGNOSIS — S6991XA Unspecified injury of right wrist, hand and finger(s), initial encounter: Secondary | ICD-10-CM | POA: Diagnosis not present

## 2021-05-17 DIAGNOSIS — S62316A Displaced fracture of base of fifth metacarpal bone, right hand, initial encounter for closed fracture: Secondary | ICD-10-CM | POA: Diagnosis not present

## 2021-05-17 LAB — SALICYLATE LEVEL: Salicylate Lvl: <7 mg/dL — ABNORMAL LOW (ref 7.0–30.0)

## 2021-05-17 LAB — COMPREHENSIVE METABOLIC PANEL
ALT: 28 U/L (ref 0–44)
AST: 31 U/L (ref 15–41)
Albumin: 3.6 g/dL (ref 3.5–5.0)
Alkaline Phosphatase: 99 U/L (ref 38–126)
Anion gap: 6 (ref 5–15)
BUN: 14 mg/dL (ref 8–23)
CO2: 28 mmol/L (ref 22–32)
Calcium: 8.6 mg/dL — ABNORMAL LOW (ref 8.9–10.3)
Chloride: 97 mmol/L — ABNORMAL LOW (ref 98–111)
Creatinine, Ser: 0.65 mg/dL (ref 0.61–1.24)
GFR, Estimated: >60 mL/min (ref >60)
Glucose, Bld: 111 mg/dL — ABNORMAL HIGH (ref 70–99)
Potassium: 3.9 mmol/L (ref 3.5–5.1)
Sodium: 131 mmol/L — ABNORMAL LOW (ref 135–145)
Total Bilirubin: 0.5 mg/dL (ref 0.3–1.2)
Total Protein: 6.2 g/dL — ABNORMAL LOW (ref 6.5–8.1)

## 2021-05-17 LAB — RESP PANEL BY RT-PCR (FLU A&B, COVID) ARPGX2
Influenza A by PCR: NEGATIVE
Influenza B by PCR: NEGATIVE
SARS Coronavirus 2 by RT PCR: NEGATIVE

## 2021-05-17 LAB — ETHANOL: Alcohol, Ethyl (B): <10 mg/dL (ref <10)

## 2021-05-17 LAB — URINE DRUG SCREEN, QUALITATIVE (ARMC ONLY)
Amphetamines, Ur Screen: NOT DETECTED
Barbiturates, Ur Screen: NOT DETECTED
Benzodiazepine, Ur Scrn: NOT DETECTED
Cannabinoid 50 Ng, Ur ~~LOC~~: NOT DETECTED
Cocaine Metabolite,Ur ~~LOC~~: NOT DETECTED
MDMA (Ecstasy)Ur Screen: NOT DETECTED
Methadone Scn, Ur: NOT DETECTED
Opiate, Ur Screen: NOT DETECTED
Phencyclidine (PCP) Ur S: NOT DETECTED
Tricyclic, Ur Screen: NOT DETECTED

## 2021-05-17 LAB — URINALYSIS, COMPLETE (UACMP) WITH MICROSCOPIC
Bacteria, UA: NONE SEEN
Bilirubin Urine: NEGATIVE
Glucose, UA: NEGATIVE mg/dL
Ketones, ur: NEGATIVE mg/dL
Leukocytes,Ua: NEGATIVE
Nitrite: NEGATIVE
Protein, ur: NEGATIVE mg/dL
Specific Gravity, Urine: 1.011 (ref 1.005–1.030)
Squamous Epithelial / HPF: NONE SEEN (ref 0–5)
WBC, UA: NONE SEEN WBC/hpf (ref 0–5)
pH: 7 (ref 5.0–8.0)

## 2021-05-17 LAB — CBC
HCT: 33.3 % — ABNORMAL LOW (ref 39.0–52.0)
Hemoglobin: 11.8 g/dL — ABNORMAL LOW (ref 13.0–17.0)
MCH: 35.2 pg — ABNORMAL HIGH (ref 26.0–34.0)
MCHC: 35.4 g/dL (ref 30.0–36.0)
MCV: 99.4 fL (ref 80.0–100.0)
Platelets: 241 10*3/uL (ref 150–400)
RBC: 3.35 MIL/uL — ABNORMAL LOW (ref 4.22–5.81)
RDW: 13.1 % (ref 11.5–15.5)
WBC: 5.6 10*3/uL (ref 4.0–10.5)
nRBC: 0 % (ref 0.0–0.2)

## 2021-05-17 LAB — ACETAMINOPHEN LEVEL: Acetaminophen (Tylenol), Serum: <10 ug/mL — ABNORMAL LOW (ref 10–30)

## 2021-05-17 MED ORDER — AMLODIPINE BESYLATE 5 MG PO TABS: 10.0000 mg | ORAL_TABLET | Freq: Every day | ORAL | Status: DC

## 2021-05-17 MED ORDER — TAMSULOSIN HCL 0.4 MG PO CAPS
0.4000 mg | ORAL_CAPSULE | Freq: Every day | ORAL | Status: DC
  Filled 2021-05-17: qty 1

## 2021-05-17 MED ORDER — AMLODIPINE BESYLATE 5 MG PO TABS
10.0000 mg | ORAL_TABLET | Freq: Every day | ORAL | Status: DC
  Administered 2021-05-18 – 2021-05-19 (×2): 10 mg via ORAL
  Filled 2021-05-17 (×2): qty 2

## 2021-05-17 MED ORDER — QUETIAPINE FUMARATE 25 MG PO TABS
100.0000 mg | ORAL_TABLET | Freq: Every day | ORAL | Status: DC
  Administered 2021-05-17 – 2021-05-18 (×2): 100 mg via ORAL
  Filled 2021-05-17 (×2): qty 4

## 2021-05-17 MED ORDER — IRBESARTAN 75 MG PO TABS
75.0000 mg | ORAL_TABLET | Freq: Every day | ORAL | Status: DC
  Administered 2021-05-18 – 2021-05-19 (×2): 75 mg via ORAL
  Filled 2021-05-17 (×2): qty 1

## 2021-05-17 MED ORDER — IRBESARTAN 75 MG PO TABS: 75.0000 mg | ORAL_TABLET | Freq: Every day | ORAL | Status: DC

## 2021-05-17 MED ORDER — DIVALPROEX SODIUM 125 MG PO DR TAB
125.0000 mg | DELAYED_RELEASE_TABLET | Freq: Two times a day (BID) | ORAL | Status: DC
  Administered 2021-05-17 – 2021-05-19 (×4): 125 mg via ORAL
  Filled 2021-05-17 (×4): qty 1

## 2021-05-17 NOTE — ED Notes (Signed)
Report received from chrystal, English as a second language teacher. Patient alert and oriented, warm and dry, and in no acute distress. Patient denies SI, HI, VH and pain. Patient made aware of Q15 minute rounds and Psychologist, counselling presence for their safety. Patient instructed to come to this nurse with needs or concerns.

## 2021-05-17 NOTE — ED Notes (Signed)
Wife came to visit. Advised pt is not sleeping. Pt is also refusing to take his home medications.

## 2021-05-17 NOTE — ED Notes (Signed)
Pt belongings includes (2 bags total);  1 pair of brown boots  1 tan hat  1 jean shorts  1 brown belt  1 plaid shirt  1 small cross  1 set of keys

## 2021-05-17 NOTE — BH Assessment (Signed)
Comprehensive Clinical Assessment (CCA) Note  05/17/2021 Eric Hatfield [id number]  Chief Complaint:  Chief Complaint  Patient presents with   Psychiatric Evaluation   Visit Diagnosis: Bipolar Affective Disorder   Eric Hatfield is a 65 year old male who presents to the ER because his wife was concerned about his behaviors. Per the wife, the patient had a medication change approximately a week ago and he stop taking the medications. He hasn't been sleeping as well. Today (05/17/2021), he was tearing sheetrock off the wall and having other manic like behaviors.  During the interview, the patient was manic and attempting to answering questions. However, his thoughts were disorganized and tangential. He was pleasant and was able to be easily redirected.  CCA Screening, Triage and Referral (STR)  Patient Reported Information How did you hear about [place]? Self  What Is the Reason for Your Visit/Call Today? Brought due to changes in his behaviors and he stop taking his medications.  How Long Has This Been Causing You Problems? 1 wk - 1 month  What Do You Feel Would Help You the Most Today? Treatment for Depression or other mood problem   Have You Recently Had Any Thoughts About Hurting Yourself? No  Are You Planning to Commit Suicide/Harm Yourself At This time? No   Have you Recently Had Thoughts About Hurting Someone Eric Hatfield? No  Are You Planning to Harm Someone at This Time? No  Explanation: No data recorded  Have You Used Any Alcohol or Drugs in the Past 24 Hours? No  How Long Ago Did You Use Drugs or Alcohol? No data recorded What Did You Use and How Much? No data recorded  Do You Currently Have a Therapist/Psychiatrist? No  Name of Therapist/Psychiatrist: No data recorded  Have You Been Recently Discharged From Any Office Practice or Programs? No  Explanation of Discharge From Practice/Program: No data recorded    CCA Screening Triage Referral Assessment Type of  Contact: Face-to-Face  Telemedicine Service Delivery:   Is this Initial or Reassessment? No data recorded Date Telepsych consult ordered in [facility]:  No data recorded Time Telepsych consult ordered in [facility]:  No data recorded Location of Assessment: [facility] ED  Provider Location: [facility] ED   Collateral Involvement: No data recorded  Does Patient Have a Court Appointed Legal Guardian? No data recorded Name and Contact of Legal Guardian: No data recorded If Minor and Not Living with Parent(s), Who has Custody? No data recorded Is CPS involved or ever been involved? Never  Is APS involved or ever been involved? Never   Patient Determined To Be At Risk for Harm To Self or Others Based on Review of Patient Reported Information or Presenting Complaint? No  Method: No data recorded Availability of Means: No data recorded Intent: No data recorded Notification Required: No data recorded Additional Information for Danger to Others Potential: No data recorded Additional Comments for Danger to Others Potential: No data recorded Are There Guns or Other Weapons in Your Home? No data recorded Types of Guns/Weapons: No data recorded Are These Weapons Safely Secured?                            No data recorded Who Could Verify You Are Able To Have These Secured: No data recorded Do You Have any Outstanding Charges, Pending Court Dates, Parole/Probation? No data recorded Contacted To Inform of Risk of Harm To Self or Others: No data recorded   Does Patient Present  under Involuntary Commitment? No  IVC Papers Initial File Date: No data recorded  [place] of Residence: [place]   Patient Currently Receiving the Following Services: Medication Management   Determination of Need: Emergent (2 hours)   Options For Referral: Inpatient Hospitalization   CCA Biopsychosocial Patient Reported Schizophrenia/Schizoaffective Diagnosis in Past: No   Strengths: Have a support system and some insight into  his mental illness.   Mental Health Symptoms Depression:   None   Duration of Depressive symptoms:    Mania:   Increased Energy; Racing thoughts; Recklessness   Anxiety:    Difficulty concentrating; Restlessness; Sleep   Psychosis:   Other negative symptoms; Grossly disorganized speech   Duration of Psychotic symptoms:  Duration of Psychotic Symptoms: Less than six months   Trauma:   N/A   Obsessions:   N/A   Compulsions:   N/A   Inattention:   N/A   Hyperactivity/Impulsivity:   N/A   Oppositional/Defiant Behaviors:   N/A   Emotional Irregularity:   N/A   Other Mood/Personality Symptoms:  No data recorded   Mental Status Exam Appearance and self-care  Stature:   Small   Weight:   Average weight   Clothing:   Neat/clean; Age-appropriate   Grooming:   Normal   Cosmetic use:   None   Posture/gait:   Normal   Motor activity:   -- (Within normal range)   Sensorium  Attention:   Distractible   Concentration:   Focuses on irrelevancies; Scattered   Orientation:   X5   Recall/memory:   Defective in Immediate; Defective in Short-term   Affect and Mood  Affect:   Anxious; Labile   Mood:   Anxious   Relating  Eye contact:   Fleeting   Facial expression:   Anxious   Attitude toward examiner:   Cooperative   Thought and Language  Speech flow:  Blocked; Flight of Ideas   Thought content:   Ideas of Reference   Preoccupation:   Other (Comment)   Hallucinations:   None   Organization:  No data recorded  [employer] of Knowledge:   Fair   Intelligence:   Average   Abstraction:   Normal   Judgement:   Impaired   Reality Testing:   Adequate   Insight:   Poor   Decision Making:   Confused   Social Functioning  Social Maturity:   Impulsive   Social Judgement:   Impropriety   Stress  Stressors:   Other (Comment)   Coping Ability:   Exhausted   Skill Deficits:   Communication;  Decision making   Supports:   Family     Religion: Religion/Spirituality Are You A Religious Person?: No  Leisure/Recreation: Leisure / Recreation Do You Have Hobbies?: No  Exercise/Diet: Exercise/Diet Do You Exercise?: No Have You Gained or Lost A Significant Amount of Weight in the Past Six Months?: No Do You Follow a Special Diet?: No Do You Have Any Trouble Sleeping?: Yes Explanation of Sleeping Difficulties: Per the wife   CCA Employment/Education Employment/Work Situation: Employment / Work Situation Employment Situation: Unemployed  Education: Education Is Patient Currently Attending School?: No Did You Have An Individualized Education Program (IIEP): No Did You Have Any Difficulty At [employer]?: No Patient's Education Has Been Impacted by Current Illness: No   CCA Family/Childhood History Family and Relationship History: Family history Marital status: Married  Childhood History:  Childhood History By whom was/is the patient raised?: Both parents Did patient suffer any  verbal/emotional/physical/sexual abuse as a child?: No Did patient suffer from severe childhood neglect?: No Has patient ever been sexually abused/assaulted/raped as an adolescent or adult?: No Was the patient ever a victim of a crime or a disaster?: No Witnessed domestic violence?: No Has patient been affected by domestic violence as an adult?: No  Child/Adolescent Assessment:   CCA Substance Use Alcohol/Drug Use: Alcohol / Drug Use Pain Medications: See PTA Prescriptions: See PTA Over the Counter: See PTA History of alcohol / drug use?: No history of alcohol / drug abuse Longest period of sobriety (when/how long): n/a   ASAM's:  Six Dimensions of Multidimensional Assessment  Dimension 1:  Acute Intoxication and/or Withdrawal Potential:      Dimension 2:  Biomedical Conditions and Complications:      Dimension 3:  Emotional, Behavioral, or Cognitive Conditions and  Complications:     Dimension 4:  Readiness to Change:     Dimension 5:  Relapse, Continued use, or Continued Problem Potential:     Dimension 6:  Recovery/Living Environment:     ASAM Severity Score:    ASAM Recommended Level of Treatment:     Substance use Disorder (SUD)    Recommendations for Services/Supports/Treatments:    Discharge Disposition:    DSM5 Diagnoses: Patient Active Problem List   Diagnosis Date Noted   Bipolar affective disorder, current episode manic (HCC) 05/01/2021   Systolic murmur 06/30/2019   SIADH (syndrome of inappropriate ADH production) (HCC) 06/28/2019   Tobacco use disorder 05/31/2019   Essential hypertension 12/11/2016   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

## 2021-05-17 NOTE — ED Provider Notes (Signed)
[facility] Emergency Department Provider Note  Time seen: 3:57 PM  I have reviewed the triage vital signs and the nursing notes.   HISTORY  Chief Complaint Psychiatric Evaluation   HPI Eric Hatfield is a 65 y.o. male with a past medical history of bipolar, hypertension, presents to the emergency department for psychiatric evaluation.  According to report patient became upset at home and was punching the wall and ripping drywall off.  Per EMS report wife states the patient has not been taking his medication, here the patient does have rapid speech concerning for possible mania episode given his history.  Patient only medical complaint is right hand pain where he does have swelling to the fourth and fifth metacarpals which he states occurred after punching a wall.  Patient is currently calm and cooperative, denies SI or HI.   Past Medical History:  Diagnosis Date   Bipolar disorder [facility])    Hypertension     Patient Active Problem List   Diagnosis Date Noted   Psychosis (HCC)    Bipolar affective disorder, current episode manic (HCC) 05/01/2021   Altered mental status 05/01/2021   Elevated blood pressure reading 12/10/2020   High risk medication use 03/19/2020   Insomnia due to mental disorder 03/19/2020   Systolic murmur 06/30/2019   SIADH (syndrome of inappropriate ADH production) (HCC) 06/28/2019   Bipolar disorder, in full remission, most recent episode depressed (HCC) 05/31/2019   Tobacco use disorder 05/31/2019   Essential hypertension 12/11/2016   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 Authorizing Provider  amLODipine (NORVASC) 10 MG tablet TAKE 1  TABLET BY MOUTH DAILY 01/31/21   Malva Limes, MD  carbamazepine (TEGRETOL) 200 MG tablet Take 1 tablet (200 mg total) by mouth 2 (two) times daily. 05/02/21   Arnetha Courser, MD  QUEtiapine (SEROQUEL) 100 MG tablet Take 1 tablet (100 mg total) by mouth at bedtime. 05/02/21   Arnetha Courser, MD  tamsulosin (FLOMAX) 0.4 MG CAPS capsule Take 1 capsule (0.4 mg total) by mouth daily after supper. 05/02/21   Arnetha Courser, MD  valsartan (DIOVAN) 80 MG tablet TAKE 1 TABLET BY MOUTH DAILY. 04/24/21   Malva Limes, MD    No Known Allergies  Family History  Problem Relation Age of Onset   Hypertension Mother    Hyperlipidemia Father    Hypertension Brother    Mental illness Paternal Grandmother     Social History Social History   Tobacco Use   Smoking status: Former    Packs/day: 0.75    Years: 30.00    Pack years: 22.50    Types: Cigarettes    Start date: 05/31/1975    Quit date: 05/01/2021    Years since quitting: 0.0   Smokeless tobacco: Never   Tobacco comments:    started smoking as a teenager average 1 ppd  Vaping Use   Vaping Use: Never used  Substance Use Topics   Alcohol use: No    Alcohol/week: 0.0 standard drinks    Comment: quit drking in his 40s.    Drug use: No    Review of Systems Constitutional: Negative for fever. Cardiovascular: Negative for chest pain. Respiratory: Negative for shortness  of breath. Gastrointestinal: Negative for abdominal pain, vomiting and diarrhea. Genitourinary: Negative for urinary compaints Musculoskeletal: Right hand pain, moderate, dull. Neurological: Negative for headache All other ROS negative  ____________________________________________   PHYSICAL EXAM:  VITAL SIGNS: ED Triage Vitals  Enc Vitals Group     BP 05/17/21 1530 132/84     Pulse Rate 05/17/21 1530 75     Resp 05/17/21 1530 20     Temp 05/17/21 1530 98.3 F (36.8 C)     Temp Source 05/17/21 1530 Oral     SpO2 05/17/21 1530 97 %     Weight 05/17/21 1532 132  lb (59.9 kg)     Height 05/17/21 1532 5\' 10"  (1.778 m)     Head Circumference --      Peak Flow --      Pain Score 05/17/21 1532 0     Pain Loc --      Pain Edu? --      Excl. in GC? --     Constitutional: Alert and oriented. Well appearing and in no distress. Eyes: Normal exam ENT      Head: Normocephalic and atraumatic.      Mouth/Throat: Mucous membranes are moist. Cardiovascular: Normal rate, regular rhythm.  Respiratory: Normal respiratory effort without tachypnea nor retractions. Breath sounds are clear Gastrointestinal: Soft and nontender. No distention.   Musculoskeletal: Mild tenderness of the right fourth and fifth meta carpals, with moderate swelling to the area.  Neuro vastly intact distally. Neurologic:  Normal speech and language. No gross focal neurologic deficits  Skin:  Skin is warm, dry and intact.  Psychiatric: Mood and affect are normal.   ____________________________________________   RADIOLOGY  X-Hodari shows fifth metacarpal fracture.  ____________________________________________   INITIAL IMPRESSION / ASSESSMENT AND PLAN / ED COURSE  Pertinent labs & imaging results that were available during my care of the patient were reviewed by me and considered in my medical decision making (see chart for details).   Patient presents to the emergency department after an episode of anger at home.  Has a history of bipolar is not currently on medications per wife.  Became angry and punched the wall has swelling and discomfort to the right fourth and fifth metacarpal area.  We will obtain an x-Darrie to rule out a boxer's fracture.  Here the patient is calm cooperative admits to getting upset at home but did not threaten anybody per patient.  Has no SI or HI.  Does not appear to meet IVC criteria currently.  We will check labs have psychiatry TTS evaluate and continue to closely monitor.  Patient does have somewhat rapid speech concerning for possible mania, we will discussed  with psychiatry and wants to evaluate.  Patient has 1/5 metacarpal fracture that has been placed in a splint.  We will follow-up with orthopedics in 1 week.  Awaiting psychiatric disposition.  No concerning findings on medical work-up otherwise.  Eric Hatfield was evaluated in Emergency Department on 05/17/2021 for the symptoms described in the history of present illness. He was evaluated in the context of the global COVID-19 pandemic, which necessitated consideration that the patient might be at risk for infection with the SARS-CoV-2 virus that causes COVID-19. Institutional protocols and algorithms that pertain to the evaluation of patients at risk for COVID-19 are in a state of rapid change based on information released by regulatory bodies including the CDC and federal and state organizations. These policies and algorithms were followed during the patient's care in  the ED.  ____________________________________________   FINAL CLINICAL IMPRESSION(S) / ED DIAGNOSES  Anger Bipolar disorder   Minna Antis, MD 05/17/21 [phone]

## 2021-05-17 NOTE — ED Triage Notes (Signed)
Pt via EMS from home. Pt's wife called, pt is here voluntary. Pt has a hx of BPI and has been off his medication. Pt is calm and cooperative. Pt is alert but manic. Word salad and flight of ideas.

## 2021-05-17 NOTE — Consult Note (Signed)
[facility] Face-to-Face Psychiatry Consult   Reason for Consult: Mania symptoms and aggression Referring Physician: EDP Patient Identification: Eric Hatfield MRN:  [id number] Principal Diagnosis: Bipolar affective disorder, current episode manic (HCC) Diagnosis:  Principal Problem:   Bipolar affective disorder, current episode manic (HCC) Active Problems:   Hyponatremia   Total Time spent with patient: 1 hour  Subjective:   Eric Hatfield is a 65 y.o. male patient was brought to ER by EMS for aggressive behavior, wife called EMS as she noticed him punching the wall. Patient states that he was punching the wall, tearing up the sheet rock, and hurt his right pinky. He does not state the reason for punching the wall except "I didn't hit a woman." Cooperative and friendly in the ED.  He appears manic with tangential thought processes and confusion at times, some pressured speech.  Low Na levels confounding mental issues.  Called his wife, no answer, message left. Also to note that patient was in the ER recently on 8/11 for altered mental status, hallucinations as noted in below.   Seen by Dr Toni Amend on 8/12, notes from that visit as below HPI: Patient seen chart reviewed.  Spoke with wife in person.  Spoke with hospitalist.  Also have communicated with outpatient psychiatrist this morning.  Patient's mental status is much improved.  His speech is completely coherent and he is speaking at a normal rate and tone.  Sentences hold together.  He is oriented to situation and place.  I did notice there still seems to be some confusion and possibly some psychotic symptoms.  When asked to talk about how he had been doing recently he starts rambling about his cell phone being changed and none of it makes much sense.  He is not aggressive and he has been cooperative with treatment.  Sodium is improved this morning up to 129.  Urine came back with no sign of infection.  Foley catheter still in place.  Past  Psychiatric History: Long history of bipolar disorder.  Tegretol has been his primary medicine with good results for his mental health for years but unfortunately seems to be the most likely cause of his chronic hyponatremia.  Patient and wife could only remember Nav[facility] as a prior psychiatric medicine and he remembered lithium from a very long time ago.  Risk to Self:  yes Risk to Others:  none Prior Inpatient Therapy:  several Prior Outpatient Therapy:  DR Elna Breslow  Past Medical History:  Past Medical History:  Diagnosis Date   Bipolar disorder (HCC)    Hypertension     Past Surgical History:  Procedure Laterality Date   Impacted tooth     Family History:  Family History  Problem Relation Age of Onset   Hypertension Mother    Hyperlipidemia Father    Hypertension Brother    Mental illness Paternal Grandmother    Family Psychiatric  History: See notes.  There is a family history of bipolar disorder. Social History:  Social History   Substance and Sexual Activity  Alcohol Use No   Alcohol/week: 0.0 standard drinks   Comment: quit drking in his 35s.      Social History   Substance and Sexual Activity  Drug Use No    Social History   Socioeconomic History   Marital status: Married    Spouse name: Not on file   Number of children: Not on file   Years of education: Not on file   Highest education level: Not on  file  Occupational History   Not on file  Tobacco Use   Smoking status: Former    Packs/day: 0.75    Years: 30.00    Pack years: 22.50    Types: Cigarettes    Start date: 05/31/1975    Quit date: 05/01/2021    Years since quitting: 0.0   Smokeless tobacco: Never   Tobacco comments:    started smoking as a teenager average 1 ppd  Vaping Use   Vaping Use: Never used  Substance and Sexual Activity   Alcohol use: No    Alcohol/week: 0.0 standard drinks    Comment: quit drking in his 40s.    Drug use: No   Sexual activity: Yes    Birth control/protection:  None  Other Topics Concern   Not on file  Social History Narrative   Not on file   Social Determinants of Health   Financial Resource Strain: Not on file  Food Insecurity: Not on file  Transportation Needs: Not on file  Physical Activity: Not on file  Stress: Not on file  Social Connections: Not on file   Additional Social History:    Allergies:  No Known Allergies  Labs:  Results for orders placed or performed during the hospital encounter of 05/17/21 (from the past 48 hour(s))  CBC     Status: Abnormal   Collection Time: 05/17/21  3:30 PM  Result Value Ref Range   WBC 5.6 4.0 - 10.5 K/uL   RBC 3.35 (L) 4.22 - 5.81 MIL/uL   Hemoglobin 11.8 (L) 13.0 - 17.0 g/dL   HCT 89.3 (L) 81.0 - 17.5 %   MCV 99.4 80.0 - 100.0 fL   MCH 35.2 (H) 26.0 - 34.0 pg   MCHC 35.4 30.0 - 36.0 g/dL   RDW 10.2 58.5 - 27.7 %   Platelets 241 150 - 400 K/uL   nRBC 0.0 0.0 - 0.2 %    Comment: Performed at [facility], [address] [place], [place] [address]  Comprehensive metabolic panel     Status: Abnormal   Collection Time: 05/17/21  3:30 PM  Result Value Ref Range   Sodium 131 (L) 135 - 145 mmol/L   Potassium 3.9 3.5 - 5.1 mmol/L   Chloride 97 (L) 98 - 111 mmol/L   CO2 28 22 - 32 mmol/L   Glucose, Bld 111 (H) 70 - 99 mg/dL    Comment: Glucose reference range applies only to samples taken after fasting for at least 8 hours.   BUN 14 8 - 23 mg/dL   Creatinine, Ser 5.36 0.61 - 1.24 mg/dL   Calcium 8.6 (L) 8.9 - 10.3 mg/dL   Total Protein 6.2 (L) 6.5 - 8.1 g/dL   Albumin 3.6 3.5 - 5.0 g/dL   AST 31 15 - 41 U/L   ALT 28 0 - 44 U/L   Alkaline Phosphatase 99 38 - 126 U/L   Total Bilirubin 0.5 0.3 - 1.2 mg/dL   GFR, Estimated >14 >43 mL/min    Comment: (NOTE) Calculated using the CKD-EPI Creatinine Equation (2021)    Anion gap 6 5 - 15    Comment: Performed at [facility], [address] [place], [place] [address]  Ethanol     Status: None   Collection Time:  05/17/21  3:30 PM  Result Value Ref Range   Alcohol, Ethyl (B) <10 <10 mg/dL    Comment: (NOTE) Lowest detectable limit for serum alcohol is 10 mg/dL.  For medical  purposes only. Performed at [facility], [address] [place], [place] [address]     No current facility-administered medications for this encounter.   Current Outpatient Medications  Medication Sig Dispense Refill   amLODipine (NORVASC) 10 MG tablet TAKE 1 TABLET BY MOUTH DAILY 30 tablet 5   carbamazepine (TEGRETOL) 200 MG tablet Take 1 tablet (200 mg total) by mouth 2 (two) times daily. 60 tablet 0   QUEtiapine (SEROQUEL) 100 MG tablet Take 1 tablet (100 mg total) by mouth at bedtime. 30 tablet 0   tamsulosin (FLOMAX) 0.4 MG CAPS capsule Take 1 capsule (0.4 mg total) by mouth daily after supper. 30 capsule 0   valsartan (DIOVAN) 80 MG tablet TAKE 1 TABLET BY MOUTH DAILY. 90 tablet 0    Musculoskeletal: Strength & Muscle Tone: within normal limits Gait & Station: WDL Patient leans: none  Psychiatric Specialty Exam: WDL  Review of Systems  Constitutional: Negative.   HENT: Negative.    Eyes: Negative.   Respiratory: Negative.    Cardiovascular: Negative.   Gastrointestinal: Negative.   Musculoskeletal: Negative.   Skin: Negative.   Neurological: Negative.   Psychiatric/Behavioral:  Positive for memory loss. Negative for depression, hallucinations, substance abuse and suicidal ideas. The patient is nervous/anxious and has insomnia.    Blood pressure 132/84, pulse 75, temperature 98.3 F (36.8 C), temperature source Oral, resp. rate 20, height 5\' 10"  (1.778 m), weight 59.9 kg, SpO2 97 %.Body mass index is 18.94 kg/m.  General Appearance: Fairly Groomed  Eye Contact:  Good  Speech:  Normal Rate  Volume:  Normal  Mood:  Dysphoric  Affect:  Appropriate  Thought Process:  Disorganized  Orientation:  Full (Time, Place, and Person)  Thought Content:  Tangential  Suicidal Thoughts:  No  Homicidal  Thoughts:  No  Memory:  Recent;   Fair  Judgement:  Fair  Insight:  Fair  Psychomotor Activity:  Normal  Concentration:  Concentration: Fair  Recall:  of Knowledge:  Fair  Language:  Good  Akathisia:  No  Handed:  Right  AIMS (if indicated):     Assets:  Social Support  ADL's:  Intact  Cognition:  WNL  Sleep:        Physical Exam: WDL Review of Systems  Constitutional: Negative.   HENT: Negative.    Eyes: Negative.   Respiratory: Negative.    Cardiovascular: Negative.   Gastrointestinal: Negative.   Musculoskeletal: Negative.   Skin: Negative.   Neurological: Negative.   Psychiatric/Behavioral:  Positive for memory loss. Negative for depression, hallucinations, substance abuse and suicidal ideas. The patient is nervous/anxious and has insomnia.   Blood pressure 132/84, pulse 75, temperature 98.3 F (36.8 C), temperature source Oral, resp. rate 20, height 5\' 10"  (1.778 m), weight 59.9 kg, SpO2 97 %. Body mass index is 18.94 kg/m.  Treatment Plan Bipolar affective disorder, mania, moderate: -Started Depakote 125 mg twice a day -Continue Seroquel 200 mg po at bedtime -Admit to inpatient psychiatric unit for stabilization  [employer], NP 05/17/2021 4:29 PM

## 2021-05-17 NOTE — ED Triage Notes (Signed)
Pt comes ems from home for psych eval. Pt had med changes about a week ago but pt hasn't been taking his meds. Pt was tearing sheetrock off his walls earlier today. Pt cooperative and pleasant for EMS. Lives with wife.

## 2021-05-17 NOTE — Discharge Instructions (Signed)
Please follow-up with orthopedics in 1 week for recheck/reevaluation of your hand fracture.  Please keep your splint in place.

## 2021-05-18 DIAGNOSIS — S62316A Displaced fracture of base of fifth metacarpal bone, right hand, initial encounter for closed fracture: Secondary | ICD-10-CM | POA: Diagnosis not present

## 2021-05-18 DIAGNOSIS — R451 Restlessness and agitation: Secondary | ICD-10-CM | POA: Diagnosis not present

## 2021-05-18 MED ORDER — LORAZEPAM 2 MG PO TABS
2.0000 mg | ORAL_TABLET | Freq: Once | ORAL | Status: AC
  Administered 2021-05-18: 2 mg via ORAL
  Filled 2021-05-18: qty 1

## 2021-05-18 NOTE — BH Assessment (Signed)
Referral information for Psychiatric Hospitalization faxed to:  [facility] ([phone]-or- [phone]),   [facility] [phone] -or- [phone]) [phone]  [facility] ([phone]),  [facility] ([phone] -or- [phone]),   [facility] ([phone] or [phone]),   

## 2021-05-18 NOTE — ED Notes (Signed)
Pt asleep at this time, unable to collect vitals. Will collect pt vitals once awake. 

## 2021-05-18 NOTE — ED Notes (Signed)
VOL/pt accepted to [facility] Monday 8/29 after 9 AM

## 2021-05-18 NOTE — BH Assessment (Signed)
Plan was for admission to [facility]U but patient has a splint with a bandage on his right arm.  Patient to be referred to additional facilities

## 2021-05-18 NOTE — ED Provider Notes (Signed)
Emergency Medicine Observation Re-evaluation Note  Eric Hatfield is a 65 y.o. male, seen on rounds today.  Pt initially presented to the ED for complaints of Psychiatric Evaluation Currently, the patient is resting.  Physical Exam  BP (!) 152/87 (BP Location: Right Arm)   Pulse 63   Temp 97.7 F (36.5 C) (Oral)   Resp 14   Ht 1.778 m (5\' 10" )   Wt 59.9 kg   SpO2 95%   BMI 18.94 kg/m  Physical Exam Gen:  No acute distress Resp:  Breathing easily and comfortably, no accessory muscle usage Neuro:  Moving all four extremities, no gross focal neuro deficits Psych:  Resting currently, calm when awake. Anxious earlier, now more calm.  ED Course / MDM  EKG:   I have reviewed the labs performed to date as well as medications administered while in observation.  Recent changes in the last 24 hours include psychiatry evaluation and placement plan.  Plan  Current plan is for transfer to [facility] on Monday.  Eric Hatfield is not under involuntary commitment.     Monday, MD 05/18/21 [phone]

## 2021-05-18 NOTE — Progress Notes (Signed)
   05/18/21 1015  Clinical Encounter Type  Visited With Patient  Visit Type Initial;Spiritual support;Social support  Referral From Other (Comment) (rounding)  Spiritual Encounters  Spiritual Needs Other (Comment) (general support)  Chaplain Burris greeted Pt and offered a supportive, compassionate presence. Chaplain Burris provided listening and offered words of encouragement. Pt welcomed the engagement.

## 2021-05-18 NOTE — BH Assessment (Signed)
PATIENT BED AVAILABLE ON Monday 05/19/21 AFTER 9AM  Patient has been accepted to [facility].  Patient assigned to Arie Sabina (Geriatric Unit) Accepting physician is Dr. Otho Perl.  Call report to [phone].  Representative was [place].   ER Staff is aware of it:  Melody ER Secretary  Dr. York Cerise, ER MD  Thayer Ohm Patient's Nurse     Attempted to contact patient's wife Eaton Folmar [phone] and [phone] but no answer, voicemail was left at [phone] for a return phone call

## 2021-05-18 NOTE — ED Notes (Signed)
Pt continues with restlessness, anxiety, uneasiness, and tangent speech. Pt heard floor being cleaned in ER and was concerned that his house was going to burn down. Verbal deescalation provided to help with pt and Dr. York Cerise notified of pt behaviors. States he will check to see what can be done.

## 2021-05-18 NOTE — ED Notes (Signed)
VOL/pending inpatient psych admission

## 2021-05-19 ENCOUNTER — Telehealth: Payer: Self-pay

## 2021-05-19 ENCOUNTER — Encounter: Payer: Self-pay | Admitting: *Deleted

## 2021-05-19 DIAGNOSIS — I10 Essential (primary) hypertension: Secondary | ICD-10-CM | POA: Diagnosis not present

## 2021-05-19 DIAGNOSIS — S62316A Displaced fracture of base of fifth metacarpal bone, right hand, initial encounter for closed fracture: Secondary | ICD-10-CM | POA: Diagnosis not present

## 2021-05-19 DIAGNOSIS — F3110 Bipolar disorder, current episode manic without psychotic features, unspecified: Secondary | ICD-10-CM | POA: Diagnosis not present

## 2021-05-19 DIAGNOSIS — Z20822 Contact with and (suspected) exposure to covid-19: Secondary | ICD-10-CM | POA: Diagnosis not present

## 2021-05-19 DIAGNOSIS — Z87891 Personal history of nicotine dependence: Secondary | ICD-10-CM | POA: Diagnosis not present

## 2021-05-19 DIAGNOSIS — R451 Restlessness and agitation: Secondary | ICD-10-CM | POA: Diagnosis not present

## 2021-05-19 DIAGNOSIS — Y900 Blood alcohol level of less than 20 mg/100 ml: Secondary | ICD-10-CM | POA: Diagnosis not present

## 2021-05-19 DIAGNOSIS — Z9114 Patient's other noncompliance with medication regimen: Secondary | ICD-10-CM | POA: Diagnosis not present

## 2021-05-19 DIAGNOSIS — F250 Schizoaffective disorder, bipolar type: Secondary | ICD-10-CM | POA: Diagnosis not present

## 2021-05-19 DIAGNOSIS — S6991XA Unspecified injury of right wrist, hand and finger(s), initial encounter: Secondary | ICD-10-CM | POA: Diagnosis not present

## 2021-05-19 DIAGNOSIS — W228XXA Striking against or struck by other objects, initial encounter: Secondary | ICD-10-CM | POA: Diagnosis not present

## 2021-05-19 DIAGNOSIS — Z79899 Other long term (current) drug therapy: Secondary | ICD-10-CM | POA: Diagnosis not present

## 2021-05-19 DIAGNOSIS — S62336A Displaced fracture of neck of fifth metacarpal bone, right hand, initial encounter for closed fracture: Secondary | ICD-10-CM | POA: Diagnosis not present

## 2021-05-19 DIAGNOSIS — N400 Enlarged prostate without lower urinary tract symptoms: Secondary | ICD-10-CM | POA: Diagnosis not present

## 2021-05-19 DIAGNOSIS — F319 Bipolar disorder, unspecified: Secondary | ICD-10-CM | POA: Diagnosis not present

## 2021-05-19 DIAGNOSIS — M7989 Other specified soft tissue disorders: Secondary | ICD-10-CM | POA: Diagnosis not present

## 2021-05-19 NOTE — Telephone Encounter (Signed)
Mom calling again to inquire about pt's labs. She is aware we are waiting on Dr Sherrie Mustache.

## 2021-05-19 NOTE — ED Notes (Signed)
Pt given breakfast tray

## 2021-05-19 NOTE — ED Provider Notes (Signed)
Emergency Medicine Observation Re-evaluation Note  Eric Hatfield is a 65 y.o. male, seen on rounds today.  Pt initially presented to the ED for complaints of Psychiatric Evaluation Currently, the patient is resting, voices no medical complaints.  Physical Exam  BP 127/76 (BP Location: Right Arm)   Pulse (!) 57   Temp 98.5 F (36.9 C) (Oral)   Resp 20   Ht 5\' 10"  (1.778 m)   Wt 59.9 kg   SpO2 98%   BMI 18.94 kg/m  Physical Exam General: Resting in no acute distress Cardiac: No cyanosis Lungs: Equal rise and fall Psych: Not agitated  ED Course / MDM  EKG:   I have reviewed the labs performed to date as well as medications administered while in observation.  Recent changes in the last 24 hours include no events overnight.  Plan  Current plan is for psychiatric disposition. Eric Hatfield is under involuntary commitment.      , MD 05/19/21 [phone]

## 2021-05-19 NOTE — Telephone Encounter (Signed)
Copied from CRM [id number]. Topic: General - Other >> May 19, 2021 10:41 AM Traci Sermon wrote: Reason for CRM: Pts mom called in about getting pts labs, pt is currently hospitalized and they need to know them, please advise.

## 2021-05-19 NOTE — ED Notes (Signed)
This RN attempted to call report at this time. Receiving RN giving medications, this RN will call back in 20.

## 2021-05-19 NOTE — Telephone Encounter (Signed)
See lab note.  

## 2021-05-19 NOTE — ED Notes (Signed)
Wife given 2/2 patient belongings bag.

## 2021-05-19 NOTE — Telephone Encounter (Signed)
This encounter was created in error - please disregard.

## 2021-05-19 NOTE — ED Notes (Signed)
[place]  [place]  [facility]  called  for  transport  to  [facility] 

## 2021-05-19 NOTE — ED Notes (Signed)
Wife of patient requesting to pick up patient belonging bags 2/2 due to house keys being in patient belongings. Patient updated and verbalized okay to give wife belongings.

## 2021-05-20 ENCOUNTER — Telehealth: Payer: Self-pay | Admitting: Family Medicine

## 2021-05-20 DIAGNOSIS — R634 Abnormal weight loss: Secondary | ICD-10-CM

## 2021-05-20 NOTE — Telephone Encounter (Signed)
Patients mother called in wanting to know when she can make an appt for an utlrasound for the patient. Please call back

## 2021-05-20 NOTE — Telephone Encounter (Signed)
Order for US abdomen complete pending in this encounter. Please review to make sure this is the correct ultrasound that you requested for this patient.

## 2021-05-22 ENCOUNTER — Telehealth: Payer: Self-pay

## 2021-05-22 NOTE — Telephone Encounter (Signed)
Copied from CRM [id number]. Topic: General - Inquiry >> May 22, 2021  9:09 AM Elliot Gault wrote: Reason for CRM: Caller wanted to inform PCP patient is currently hospitalized since 05/17/2021

## 2021-05-22 NOTE — Telephone Encounter (Signed)
Copied from CRM [id number]. Topic: Appointment Scheduling - Scheduling Inquiry for Clinic >> May 22, 2021  9:06 AM Elliot Gault wrote: Reason for CRM: Caller returning call from [facility] (referral coordinator)

## 2021-05-28 ENCOUNTER — Ambulatory Visit: Payer: BC Managed Care – PPO | Admitting: Psychiatry

## 2021-05-28 ENCOUNTER — Other Ambulatory Visit: Payer: Self-pay | Admitting: Family Medicine

## 2021-05-29 ENCOUNTER — Ambulatory Visit: Payer: BC Managed Care – PPO | Admitting: Psychiatry

## 2021-06-05 ENCOUNTER — Ambulatory Visit: Payer: BC Managed Care – PPO | Admitting: Psychiatry

## 2021-06-16 ENCOUNTER — Other Ambulatory Visit: Payer: Self-pay | Admitting: Family Medicine

## 2021-06-17 ENCOUNTER — Telehealth: Payer: Self-pay

## 2021-06-17 NOTE — Telephone Encounter (Signed)
Pt mom carolyn is calling to see if her son can be worked in

## 2021-06-17 NOTE — Telephone Encounter (Signed)
He can have the 1pm slot on Friday. Needs to be 40 minute hospital follow up visit.

## 2021-06-17 NOTE — Telephone Encounter (Signed)
Requested medications are on the active medication list NO  Last visit 05/14/21  Future visit scheduled No  Notes to clinic Meds are not on current med list, both meds also Not Delegated, please assess.

## 2021-06-17 NOTE — Telephone Encounter (Signed)
Copied from CRM [id number]. Topic: General - Other >> Jun 16, 2021  4:07 PM Jaquita Rector A wrote:  Reason for CRM: Patient mom Eber Jones called in to inform Dr Sherrie Mustache that patient  was seen at a hospital in [place] and placed on some new medication need to be seen by Dr Sherrie Mustache ASAP since he wont see new psychiatrist until 09/09/21 asking for a call back from nurse to be worked into the schedule earlier.  Ph# [phone]

## 2021-06-18 ENCOUNTER — Ambulatory Visit: Payer: BC Managed Care – PPO | Admitting: Licensed Clinical Social Worker

## 2021-06-18 NOTE — Telephone Encounter (Signed)
Mrs. Andrey Campanile advised.   Thanks,   -Vernona Rieger

## 2021-06-20 ENCOUNTER — Other Ambulatory Visit: Payer: Self-pay

## 2021-06-20 ENCOUNTER — Ambulatory Visit (INDEPENDENT_AMBULATORY_CARE_PROVIDER_SITE_OTHER): Payer: BC Managed Care – PPO | Admitting: Family Medicine

## 2021-06-20 DIAGNOSIS — Z5329 Procedure and treatment not carried out because of patient's decision for other reasons: Secondary | ICD-10-CM

## 2021-06-20 MED ORDER — RISPERIDONE 2 MG PO TABS: 2.0000 mg | ORAL_TABLET | Freq: Two times a day (BID) | ORAL | 0 refills | Status: DC

## 2021-06-20 MED ORDER — DIVALPROEX SODIUM 500 MG PO DR TAB: 500.0000 mg | DELAYED_RELEASE_TABLET | Freq: Two times a day (BID) | ORAL | 0 refills | Status: DC

## 2021-06-20 NOTE — Progress Notes (Signed)
     Patient left before being seen. His wife called reporting he need refill for new psychiatric medications prescribed

## 2021-06-20 NOTE — Telephone Encounter (Signed)
Patient had an appointment today in the office for a follow up. He checked in at the front desk and then left immediately after. His wife Larene Beach called the office when they got back home stating that patient walked out because he didn't want to wait. Vickie states that he needs refills on Depakote and Risperdal. I advised Vickie that these medications are supposed to be managed by psychiatry. Vickie asked if I could call patient's mom and explain this to her because she didn't quite understand what I was saying. I called patient's mom Eber Jones [phone] who is listed on patient's DPR. I explained the situation and advised her that patient is supposed to be seeing psychiatry for refills on these medications. Eber Jones told me that patient's psychiatrist is [name]. Patient's appointment with [name] is scheduled for 09/09/2021. Patient will be out of medication on Monday. Patient's mom is going to try contacting Dr. Shelda Altes office to request refills. Eber Jones is unsure that [name] will refill the medications before actually seeing the patient. Patient's mom and wife want to know if Dr. Sherrie Mustache will refill medications to prevent patient from running out of medication. Per patient' mom, the dosage and directions for both medications is correct in his chart. Please advise.   Pharmacy: [facility]

## 2021-06-20 NOTE — Progress Notes (Incomplete)
Patient's wife called requesting refills on medications. She reports that they were here for his appt and they had to "wait too long". Patient had a 1pm appt and left before 1:15pm.

## 2021-06-23 ENCOUNTER — Telehealth: Payer: Self-pay

## 2021-06-23 ENCOUNTER — Other Ambulatory Visit: Payer: Self-pay | Admitting: Family Medicine

## 2021-06-23 NOTE — Telephone Encounter (Signed)
Copied from CRM [id number]. Topic: General - Other >> Jun 23, 2021 10:40 AM Wyonia Hough E wrote: Reason for CRM: Pts mother called and wanted to thank DR. Fisher for sending the refills for his medication/ FYI

## 2021-06-24 NOTE — Telephone Encounter (Signed)
It looks like this was refills 06/20/2021   Thanks,   -Vernona Rieger

## 2021-06-24 NOTE — Telephone Encounter (Signed)
Requested medication (s) are due for refill today:   No for both  Requested medication (s) are on the active medication list:   Yes for both  Future visit scheduled:   No  was a no show 4 days ago with Sherrie Mustache   Last ordered: 06/20/2021 #60, 0 refills for both  Returned because both are non delegated refills   Requested Prescriptions  Pending Prescriptions Disp Refills   divalproex (DEPAKOTE) 500 MG DR tablet [Pharmacy Med Name: DIVALPROEX SODIUM 500 MG DR TAB] 60 tablet 0    Sig: TAKE 1 TABLET BY MOUTH 2 TIMES DAILY.     Not Delegated - Neurology:  Anticonvulsants - Valproates Failed - 06/23/2021 10:37 AM      Failed - This refill cannot be delegated      Failed - HGB in normal range and within 360 days    Hemoglobin  Date Value Ref Range Status  05/17/2021 11.8 (L) 13.0 - 17.0 g/dL Final  95/18/8416 60.6 (L) 13.0 - 17.7 g/dL Final          Failed - HCT in normal range and within 360 days    HCT  Date Value Ref Range Status  05/17/2021 33.3 (L) 39.0 - 52.0 % Final   Hematocrit  Date Value Ref Range Status  05/14/2021 33.8 (L) 37.5 - 51.0 % Final          Failed - Valproic Acid (serum) in normal range and within 360 days    No results found for: VALPROATE, VPAT        Passed - AST in normal range and within 360 days    AST  Date Value Ref Range Status  05/17/2021 31 15 - 41 U/L Final          Passed - ALT in normal range and within 360 days    ALT  Date Value Ref Range Status  05/17/2021 28 0 - 44 U/L Final          Passed - PLT in normal range and within 360 days    Platelets  Date Value Ref Range Status  05/17/2021 241 150 - 400 K/uL Final  05/14/2021 257 150 - 450 x10E3/uL Final          Passed - WBC in normal range and within 360 days    WBC  Date Value Ref Range Status  05/17/2021 5.6 4.0 - 10.5 K/uL Final          Passed - Valid encounter within last 12 months    Recent Outpatient Visits           4 days ago No-show for appointment    [facility] Malva Limes, MD   1 month ago Bipolar affective disorder, currently manic, moderate (HCC)   [facility] Malva Limes, MD   5 months ago Essential hypertension   [facility] Malva Limes, MD   12 months ago Essential hypertension   [facility] Malva Limes, MD   2 years ago Hyponatremia   [facility] Malva Limes, MD               risperiDONE (RISPERDAL) 2 MG tablet [Pharmacy Med Name: RISPERIDONE 2 MG TAB] 60 tablet 0    Sig: TAKE 1 TABLET BY MOUTH 2 TIMES DAILY.     Not Delegated - Psychiatry:  Antipsychotics - Second Generation (Atypical) - risperidone Failed - 06/23/2021 10:37 AM  Failed - This refill cannot be delegated      Failed - Prolactin Level (serum) in normal range and within 180 days    No results found for: PROLACTIN, TOTPROLACTIN, LABPROL        Passed - ALT in normal range and within 180 days    ALT  Date Value Ref Range Status  05/17/2021 28 0 - 44 U/L Final          Passed - AST in normal range and within 180 days    AST  Date Value Ref Range Status  05/17/2021 31 15 - 41 U/L Final          Passed - Valid encounter within last 6 months    Recent Outpatient Visits           4 days ago No-show for appointment   [facility] Malva Limes, MD   1 month ago Bipolar affective disorder, currently manic, moderate (HCC)   [facility] Malva Limes, MD   5 months ago Essential hypertension   [facility] Malva Limes, MD   12 months ago Essential hypertension   [facility] Malva Limes, MD   2 years ago Hyponatremia   [facility] Malva Limes, MD

## 2021-06-27 ENCOUNTER — Telehealth: Payer: Self-pay

## 2021-06-27 ENCOUNTER — Ambulatory Visit: Admission: RE | Admit: 2021-06-27 | Payer: BC Managed Care – PPO | Source: Ambulatory Visit

## 2021-06-27 NOTE — Telephone Encounter (Signed)
Copied from CRM [id number]. Topic: General - Other >> Jun 27, 2021 10:10 AM Jaquita Rector A wrote: Reason for CRM: Ultrasound department called in to inform dr Sherrie Mustache that patient did not show up this morning for his appointment. Please advise

## 2021-07-17 ENCOUNTER — Telehealth: Payer: Self-pay | Admitting: Psychiatry

## 2021-07-21 ENCOUNTER — Other Ambulatory Visit: Payer: Self-pay | Admitting: Family Medicine

## 2021-07-21 NOTE — Telephone Encounter (Signed)
Requested medications are due for refill today.  yes  Requested medications are on the active medications list.  yes  Last refill. 06/25/2021 for both  Future visit scheduled.   yes  Notes to clinic.  Medications are not delegated.

## 2021-07-23 NOTE — Telephone Encounter (Signed)
This patient was supposed to follow up at [facility] regarding his psychiatric medication. These are not medications I am familiar and cannot continue to prescribe without him being followed by psychiatry.

## 2021-07-23 NOTE — Telephone Encounter (Signed)
Patient states he has had good luck with these medications.Patient has appointment with Dr Maryruth Bun 09/09/21 and he is on the cancellation list. Patient states he was able to pick up RF- Risperdal and Depakote- 1 month supply. Patient is hopeful they will call for cancellation list appointment- but will contact PCP if he needs bridge RF in December.

## 2021-07-23 NOTE — Telephone Encounter (Signed)
LMTCB 07/23/2021.  PEC please advise pt or caregivers as below.   Thanks,   -Vernona Rieger

## 2021-07-29 ENCOUNTER — Other Ambulatory Visit: Payer: Self-pay | Admitting: Family Medicine

## 2021-07-29 DIAGNOSIS — I10 Essential (primary) hypertension: Secondary | ICD-10-CM

## 2021-08-01 ENCOUNTER — Other Ambulatory Visit: Payer: Self-pay | Admitting: Family Medicine

## 2021-08-01 DIAGNOSIS — I10 Essential (primary) hypertension: Secondary | ICD-10-CM

## 2021-08-22 ENCOUNTER — Other Ambulatory Visit: Payer: Self-pay | Admitting: Family Medicine

## 2021-09-03 ENCOUNTER — Ambulatory Visit
Admission: EM | Admit: 2021-09-03 | Discharge: 2021-09-03 | Disposition: A | Discharge status: Home / Self Care | Payer: BC Managed Care – PPO

## 2021-09-03 ENCOUNTER — Ambulatory Visit: Payer: Self-pay | Admitting: *Deleted

## 2021-09-03 ENCOUNTER — Encounter: Payer: Self-pay | Admitting: Emergency Medicine

## 2021-09-03 DIAGNOSIS — H6121 Impacted cerumen, right ear: Secondary | ICD-10-CM

## 2021-09-03 NOTE — Telephone Encounter (Signed)
°  Chief Complaint: ear pressure,hearing loss Symptoms: R ear hearing loss, pressure Frequency: 6 days Pertinent Negatives: Patient denies fever,pain Disposition: [] ED /[x] Urgent Care (no appt availability in office) / [] Appointment(In office/virtual)/ []  [place] [facility]/ [] Home Care/ [] Refused Recommended Disposition  Additional Notes:

## 2021-09-03 NOTE — ED Triage Notes (Signed)
Pt here with right ear fullness and muffled sound x 1 week. On primary assessment, cerumen impaction is noted within the right ear canal.

## 2021-09-03 NOTE — Telephone Encounter (Signed)
Reason for Disposition  [1] Decreased hearing in 1 ear AND [2] gradual onset  Answer Assessment - Initial Assessment Questions 1. DESCRIPTION: "What type of hearing problem are you having? Describe it for me." (e.g., complete hearing loss, partial loss)     Feels ear is stopped up 2. LOCATION: "One or both ears?" If one, ask: "Which ear?"     R ear 3. SEVERITY: "Can you hear anything?" If Yes, ask: "What can you hear?" (e.g., ticking watch, whisper, talking)   - MILD:  Difficulty hearing soft speech, quiet library sounds, or speech from a distance or over background noise.   - MODERATE: Difficulty hearing normal speech even at closed distances.   - SEVERE: Unable to hear most normal conversation and talking; only able to hear loud sounds such as an alarm clock.     moderate 4. ONSET: "When did this begin?" "Did it start suddenly or come on gradually?"     1 week- last Friday 5. PATTERN: "Does this come and go, or has it been constant since it started?"     constant 6. PAIN: "Is there any pain in your ear(s)?"  (Scale 1-10; or mild, moderate, severe)   - NONE (0): no pain   - MILD (1-3): doesn't interfere with normal activities    - MODERATE (4-7): interferes with normal activities or awakens from sleep    - SEVERE (8-10): excruciating pain, unable to do any normal activities      No pain 7. CAUSE: "What do you think is causing this hearing problem?"     Possible wax 8. OTHER SYMPTOMS: "Do you have any other symptoms?" (e.g., dizziness, ringing in ears)     Fullness in ear 9. PREGNANCY: "Is there any chance you are pregnant?" "When was your last menstrual period?"     na  Protocols used: Hearing Loss or Change-A-AH

## 2021-09-03 NOTE — ED Provider Notes (Addendum)
[facility]    CSN: [id number] Arrival date & time: 09/03/21  0900      History   Chief Complaint Chief Complaint  Patient presents with   Ear Fullness    HPI Eric A Jecory Voce. is a 65 y.o. male.   HPI  Ear Fullness: Pt reports that for the past week he has had ear fullness in his right ear. Improved yesterday after a shower but then worsened this morning. No injury, bleeding or pain. He has tried alcohol on a Q-tip without success.    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 Authorizing Provider  amLODipine (NORVASC) 10 MG tablet TAKE 1 TABLET BY MOUTH DAILY 08/01/21   Birdie Sons, MD  divalproex (DEPAKOTE) 500 MG DR tablet TAKE 1 TABLET BY MOUTH 2 TIMES DAILY. 08/22/21   Birdie Sons, MD  QUEtiapine (SEROQUEL) 100 MG tablet Take 1 tablet (100 mg total) by mouth at bedtime. 05/02/21   Lorella Nimrod, MD  risperiDONE (RISPERDAL) 2 MG tablet TAKE 1 TABLET BY MOUTH 2 TIMES DAILY. 08/22/21   Birdie Sons, MD  tamsulosin (FLOMAX) 0.4 MG CAPS capsule TAKE 1 CAPSULE BY MOUTH EVERY DAY AFTER SUPPER 05/28/21   Birdie Sons, MD  valsartan (DIOVAN) 80 MG tablet TAKE 1 TABLET BY MOUTH DAILY. 07/29/21   Birdie Sons, MD    Family History Family History   Problem Relation Age of Onset   Hypertension Mother    Hyperlipidemia Father    Hypertension Brother    Mental illness Paternal Grandmother     Social History Social History   Tobacco Use   Smoking status: Former    Packs/day: 0.75    Years: 30.00    Pack years: 22.50    Types: Cigarettes    Start date: 05/31/1975    Quit date: 05/01/2021    Years since quitting: 0.3   Smokeless tobacco: Never   Tobacco comments:    started smoking as a teenager average 1 ppd  Vaping Use   Vaping Use: Never used  Substance Use Topics   Alcohol use: No    Alcohol/week: 0.0 standard drinks    Comment: quit drking in his 17s.    Drug use: No     Allergies   Patient has no known allergies.   Review of Systems Review of Systems  As stated above in HPI Physical Exam Triage Vital Signs ED Triage Vitals  Enc Vitals Group     BP 09/03/21 0932 129/76     Pulse Rate 09/03/21 0932 92     Resp 09/03/21 0932 18     Temp 09/03/21 0932 97.9 F (36.6 C)     Temp src --      SpO2 09/03/21 0932 98 %  Weight --      Height --      Head Circumference --      Peak Flow --      Pain Score 09/03/21 0933 0     Pain Loc --      Pain Edu? --      Excl. in GC? --    No data found.  Updated Vital Signs BP 129/76    Pulse 92    Temp 97.9 F (36.6 C)    Resp 18    SpO2 98%   Physical Exam Vitals and nursing note reviewed.  Constitutional:      General: He is not in acute distress.    Appearance: Normal appearance. He is not ill-appearing, toxic-appearing or diaphoretic.     Comments: Gross hearing intact bilaterally   HENT:     Head: Normocephalic and atraumatic.     Right Ear: Tympanic membrane, ear canal and external ear normal. There is impacted cerumen.     Left Ear: Tympanic membrane, ear canal and external ear normal.     Nose: Nose normal.  Skin:    General: Skin is warm.  Neurological:     Mental Status: He is alert and oriented to person, place, and time.     UC  Treatments / Results  Labs (all labs ordered are listed, but only abnormal results are displayed) Labs Reviewed - No data to display  EKG   Radiology No results found.  Procedures Procedures (including critical care time)  Medications Ordered in UC Medications - No data to display  Initial Impression / Assessment and Plan / UC Course  I have reviewed the triage vital signs and the nursing notes.  Pertinent labs & imaging results that were available during my care of the patient were reviewed by me and considered in my medical decision making (see chart for details).     New. Cerumen impaction of the right ear. Treating with ear lavage by nursing staff. Procedure tolerated well and successful with over 95% of cerumen removed using luke warm water. Build up prevention instructions given. There was some erythema of canal where cerumen was located- he denies current pain so we will watch and wait. If he develops pain of the canal I would recommend ofloxacin drops. Follow up PRN.   Final Clinical Impressions(s) / UC Diagnoses   Final diagnoses:  None   Discharge Instructions   None    ED Prescriptions   None    PDMP not reviewed this encounter.   Rushie Chestnut, PA-C 09/03/21 0946    Rushie Chestnut, PA-C 09/03/21 1011

## 2021-09-08 ENCOUNTER — Other Ambulatory Visit: Payer: Self-pay | Admitting: Family Medicine

## 2021-09-08 NOTE — Telephone Encounter (Signed)
Requested Prescriptions  Pending Prescriptions Disp Refills   tamsulosin (FLOMAX) 0.4 MG CAPS capsule [Pharmacy Med Name: TAMSULOSIN HCL 0.4 MG CAP] 30 capsule 0    Sig: TAKE 1 CAPSULE BY MOUTH EVERY DAY AFTER SUPPER     Urology: Alpha-Adrenergic Blocker Passed - 09/08/2021 10:03 AM      Passed - Last BP in normal range    BP Readings from Last 1 Encounters:  09/03/21 129/76         Passed - Valid encounter within last 12 months    Recent Outpatient Visits          2 months ago No-show for appointment   [facility] Malva Limes, MD   3 months ago Bipolar affective disorder, currently manic, moderate [facility])   [facility] Malva Limes, MD   8 months ago Essential hypertension   [facility] Malva Limes, MD   1 year ago Essential hypertension   [facility] Malva Limes, MD   2 years ago Hyponatremia   [facility] Malva Limes, MD      Future Appointments            In 1 week Fisher, Demetrios Isaacs, MD [facility]

## 2021-09-09 DIAGNOSIS — F5105 Insomnia due to other mental disorder: Secondary | ICD-10-CM | POA: Diagnosis not present

## 2021-09-09 DIAGNOSIS — F3174 Bipolar disorder, in full remission, most recent episode manic: Secondary | ICD-10-CM | POA: Diagnosis not present

## 2021-09-09 DIAGNOSIS — F1011 Alcohol abuse, in remission: Secondary | ICD-10-CM | POA: Diagnosis not present

## 2021-09-19 ENCOUNTER — Other Ambulatory Visit: Payer: Self-pay

## 2021-09-19 ENCOUNTER — Other Ambulatory Visit: Payer: Self-pay | Admitting: Family Medicine

## 2021-09-19 ENCOUNTER — Ambulatory Visit: Payer: BC Managed Care – PPO | Admitting: Family Medicine

## 2021-09-19 ENCOUNTER — Encounter: Payer: Self-pay | Admitting: Family Medicine

## 2021-09-19 VITALS — BP 123/76 | HR 81 | Temp 98.2°F | Ht 66.0 in | Wt 146.7 lb

## 2021-09-19 DIAGNOSIS — E871 Hypo-osmolality and hyponatremia: Secondary | ICD-10-CM

## 2021-09-19 DIAGNOSIS — F17210 Nicotine dependence, cigarettes, uncomplicated: Secondary | ICD-10-CM | POA: Diagnosis not present

## 2021-09-19 DIAGNOSIS — I10 Essential (primary) hypertension: Secondary | ICD-10-CM

## 2021-09-19 NOTE — Telephone Encounter (Signed)
[facility] faxed refill request for the following medications:   divalproex (DEPAKOTE) 500 MG DR tablet   risperiDONE (RISPERDAL) 2 MG tablet   Please advise.

## 2021-09-19 NOTE — Progress Notes (Signed)
Established patient visit   Patient: Eric Hatfield.   DOB: 1955-10-25   65 y.o. Male  MRN: [id number] Visit Date: 09/19/2021  Today's healthcare provider: Mila Merry, MD   No chief complaint on file.  Subjective    HPI  Hypertension, follow-up  BP Readings from Last 3 Encounters:  09/03/21 129/76  05/19/21 127/79  05/14/21 100/66   Wt Readings from Last 3 Encounters:  05/17/21 132 lb (59.9 kg)  05/14/21 133 lb (60.3 kg)  05/01/21 151 lb 7.3 oz (68.7 kg)     He was last seen for hypertension 8 months ago. (12/27/20) BP at that visit was 142/78. Management since that visit includes tolerating addition of valsartan. maxed out on amlodipine.  He reports excellent compliance with treatment. He is not having side effects. { He is following a Low Sodium diet. He is exercising. He does not smoke.  Use of agents associated with hypertension: none.   Outside blood pressures are none. Symptoms: No chest pain No chest pressure  No palpitations No syncope  No dyspnea No orthopnea  No paroxysmal nocturnal dyspnea No lower extremity edema   Pertinent labs: Lab Results  Component Value Date   CHOL 211 (H) 06/23/2019   HDL 82 06/23/2019   LDLCALC 122 (H) 06/23/2019   TRIG 40 06/23/2019   CHOLHDL 2.6 06/23/2019   Lab Results  Component Value Date   NA 131 (L) 05/17/2021   K 3.9 05/17/2021   CREATININE 0.65 05/17/2021   GFRNONAA >60 05/17/2021   GLUCOSE 111 (H) 05/17/2021   TSH 1.228 05/02/2021     The 10-year ASCVD risk score (Arnett DK, et al., 2019) is: 10.8%   ---------------------------------------------------------------------------------------------------   Medications: Outpatient Medications Prior to Visit  Medication Sig   amLODipine (NORVASC) 10 MG tablet TAKE 1 TABLET BY MOUTH DAILY   divalproex (DEPAKOTE) 500 MG DR tablet TAKE 1 TABLET BY MOUTH 2 TIMES DAILY.   QUEtiapine (SEROQUEL) 100 MG tablet Take 1 tablet (100 mg total) by mouth at  bedtime.   risperiDONE (RISPERDAL) 2 MG tablet TAKE 1 TABLET BY MOUTH 2 TIMES DAILY.   tamsulosin (FLOMAX) 0.4 MG CAPS capsule TAKE 1 CAPSULE BY MOUTH EVERY DAY AFTER SUPPER   valsartan (DIOVAN) 80 MG tablet TAKE 1 TABLET BY MOUTH DAILY.   No facility-administered medications prior to visit.         Objective    BP 123/76 (BP Location: Left Arm, Patient Position: Sitting, Cuff Size: Normal)    Pulse 81    Temp 98.2 F (36.8 C) (Oral)    Ht 5\' 6"  (1.676 m)    Wt 146 lb 11.2 oz (66.5 kg)    SpO2 97%    BMI 23.68 kg/m  {Show previous vital signs (optional):23777}  Physical Exam    General: Appearance:    Well developed, well nourished male in no acute distress  Eyes:    PERRL, conjunctiva/corneas clear, EOM's intact       Lungs:     Clear to auscultation bilaterally, respirations unlabored  Heart:    Normal heart rate. Normal rhythm.  2/6 systolic murmur   MS:   All extremities are intact.    Neurologic:   Awake, alert, oriented x 3. No apparent focal neurological defect.         Assessment & Plan     1. Essential hypertension Well controlled.  Continue current medications.    2. Smokes with greater than 30 pack year history  Encourage smoking cessation.  - Ambulatory Referral Lung Cancer Screening [facility]  3. Hyponatremia Likely secondary to psychiatric medications. Had labs done by dr Maryruth Bun about 2weeks ago which he states were normal. Will send for records.    Future Appointments  Date Time Provider Department Center  03/18/2022 10:40 AM Malva Limes, MD [facility]    He declined flu vaccine.      The entirety of the information documented in the History of Present Illness, Review of Systems and Physical Exam were personally obtained by me. Portions of this information were initially documented by the CMA and reviewed by me for thoroughness and accuracy.     Mila Merry, MD  [facility] [phone] (phone) [phone]  (fax)  [facility]

## 2021-09-22 DIAGNOSIS — F3174 Bipolar disorder, in full remission, most recent episode manic: Secondary | ICD-10-CM | POA: Diagnosis not present

## 2021-09-22 DIAGNOSIS — F1011 Alcohol abuse, in remission: Secondary | ICD-10-CM | POA: Diagnosis not present

## 2021-09-22 DIAGNOSIS — F5105 Insomnia due to other mental disorder: Secondary | ICD-10-CM | POA: Diagnosis not present

## 2021-09-23 NOTE — Telephone Encounter (Signed)
Last refill:  divalproex 08/22/2021 #60 with 0 refills        Risperidone 08/22/2021 #60 with 0 refills  Last office visit: 09/19/2021 (seen for HTN f/u and hyponatremia)  Next office visit: 03/18/2021

## 2021-09-24 NOTE — Telephone Encounter (Signed)
I think he has established with [name]. He needs to contact her for refills of his psychiatric medications.

## 2021-09-24 NOTE — Telephone Encounter (Signed)
Tried calling; no answer.  [facility] please advise pt that [name] will need to refill these medications.     Thanks,   -Vernona Rieger

## 2021-10-08 ENCOUNTER — Other Ambulatory Visit: Payer: Self-pay | Admitting: Family Medicine

## 2021-10-10 ENCOUNTER — Telehealth: Payer: Self-pay | Admitting: Family Medicine

## 2021-10-10 NOTE — Telephone Encounter (Signed)
Patient's wife called and stated he had an appointment with Dr Maryruth Bun this morning. Dr Maryruth Bun was concerned because the patient had loss 8lbs since his last visit in December. She wanted the patient to go ahead with the lung screening. I called [facility] and left a message for Abigail Miyamoto to contact patient

## 2021-10-13 NOTE — Telephone Encounter (Signed)
This request for [place] [facility] screening got routed to wrong office please advise by calling mother at [phone]

## 2021-10-13 NOTE — Telephone Encounter (Signed)
Pts mother called in stating he wanted to go somewhere in [place], please advise.

## 2021-10-14 NOTE — Telephone Encounter (Signed)
Pts mother called back in stating they reached out to the referral office and they told the pt that they can go ahead and see him but the referral needs to be sent over, before they can do it, please advise.

## 2021-10-14 NOTE — Telephone Encounter (Signed)
Mother requesting to LUNG SCAN SCREENING please send to Imaging location in the hospital

## 2021-10-14 NOTE — Telephone Encounter (Signed)
I called patient's mom and advised her that we have sent a message to  Pulmonary to call patient back with  update and appointment.   [name] please reach out to patient about this appointment.

## 2021-10-14 NOTE — Telephone Encounter (Addendum)
Hi Eric Hatfield. Dr. Sherrie Mustache placed an order for a lung cancer screen on 09/19/2021. It looks like you have tried contacting patient. Patient is requesting a call back about this appointment. Please call patient.

## 2021-10-15 ENCOUNTER — Other Ambulatory Visit: Payer: Self-pay | Admitting: *Deleted

## 2021-10-15 DIAGNOSIS — F17210 Nicotine dependence, cigarettes, uncomplicated: Secondary | ICD-10-CM

## 2021-10-15 DIAGNOSIS — Z87891 Personal history of nicotine dependence: Secondary | ICD-10-CM

## 2021-10-15 NOTE — Telephone Encounter (Signed)
Spoke with patient' s mother (DPR)  Scheduled SDMV 10/28/21 4:30 CT order was placed She voiced understanding and had no further questions.

## 2021-10-28 ENCOUNTER — Other Ambulatory Visit: Payer: Self-pay

## 2021-10-28 ENCOUNTER — Ambulatory Visit (INDEPENDENT_AMBULATORY_CARE_PROVIDER_SITE_OTHER): Payer: BC Managed Care – PPO | Admitting: Acute Care

## 2021-10-28 ENCOUNTER — Encounter: Payer: Self-pay | Admitting: Acute Care

## 2021-10-28 DIAGNOSIS — F17210 Nicotine dependence, cigarettes, uncomplicated: Secondary | ICD-10-CM

## 2021-10-28 NOTE — Progress Notes (Addendum)
Virtual Visit via Telephone Note  I connected with Eric Hatfield. on 10/29/21 at  4:30 PM EST by telephone and verified that I am speaking with the correct person using two identifiers.  Location: Patient:  At home Provider: [address] [place], [place], [address]    I discussed the limitations, risks, security and privacy concerns of performing an evaluation and management service by telephone and the availability of in person appointments. I also discussed with the patient that there may be a patient responsible charge related to this service. The patient expressed understanding and agreed to proceed.    Shared Decision Making Visit Lung Cancer Screening Program [phone])   Eligibility: Age 66 y.o. Pack Years Smoking History Calculation 50 pack year smoking history (# packs/per year x # years smoked) Recent History of coughing up blood  no Unexplained weight loss? no ( >Than 15 pounds within the last 6 months ) Prior History Lung / other cancer no (Diagnosis within the last 5 years already requiring surveillance chest CT Scans). Smoking Status Current Smoker Former Smokers: Years since quit:  NA  Quit Date:  NA  Visit Components: Discussion included one or more decision making aids. yes Discussion included risk/benefits of screening. yes Discussion included potential follow up diagnostic testing for abnormal scans. yes Discussion included meaning and risk of over diagnosis. yes Discussion included meaning and risk of False Positives. yes Discussion included meaning of total radiation exposure. yes  Counseling Included: Importance of adherence to annual lung cancer LDCT screening. yes Impact of comorbidities on ability to participate in the program. yes Ability and willingness to under diagnostic treatment. yes  Smoking Cessation Counseling: Current Smokers:  Discussed importance of smoking cessation. yes Information about tobacco cessation classes and  interventions provided to patient. yes Patient provided with "ticket" for LDCT Scan. yes Symptomatic Patient. no  Counseling NA Diagnosis Code: Tobacco Use Z72.0 Asymptomatic Patient yes  Counseling (Intermediate counseling: > three minutes counseling) G3151 Former Smokers:  Discussed the importance of maintaining cigarette abstinence. yes Diagnosis Code: Personal History of Nicotine Dependence. V61.607 Information about tobacco cessation classes and interventions provided to patient. Yes Patient provided with "ticket" for LDCT Scan. yes Written Order for Lung Cancer Screening with LDCT placed in Epic. Yes (CT Chest Lung Cancer Screening Low Dose W/O CM) [id number] Z12.2-Screening of respiratory organs Z87.891-Personal history of nicotine dependence  I have spent 25 minutes of face to face/ virtual visit   time with  Mr. Dube discussing the risks and benefits of lung cancer screening. We viewed / discussed a power point together that explained in detail the above noted topics. We paused at intervals to allow for questions to be asked and answered to ensure understanding.We discussed that the single most powerful action that he can take to decrease his risk of developing lung cancer is to quit smoking. We discussed whether or not he is ready to commit to setting a quit date. We discussed options for tools to aid in quitting smoking including nicotine replacement therapy, non-nicotine medications, support groups, Quit Smart classes, and behavior modification. We discussed that often times setting smaller, more achievable goals, such as eliminating 1 cigarette a day for a week and then 2 cigarettes a day for a week can be helpful in slowly decreasing the number of cigarettes smoked. This allows for a sense of accomplishment as well as providing a clinical benefit. I provided  him  with smoking cessation  information  with contact information for community  resources, classes, free nicotine replacement  therapy, and access to mobile apps, text messaging, and on-line smoking cessation help. I have also provided  him  the office contact information in the event he needs to contact me, or the screening staff. We discussed the time and location of the scan, and that either Abigail Miyamoto RN, Karlton Lemon, RN  or I will call / send a letter with the results within 24-72 hours of receiving them. The patient verbalized understanding of all of  the above and had no further questions upon leaving the office. They have my contact information in the event they have any further questions.  I spent 3-4 minutes counseling on smoking cessation and the health risks of continued tobacco abuse.  I explained to the patient that there has been a high incidence of coronary artery disease noted on these exams. I explained that this is a non-gated exam therefore degree or severity cannot be determined. This patient is not on statin therapy. I have asked the patient to follow-up with their PCP regarding any incidental finding of coronary artery disease and management with diet or medication as their PCP  feels is clinically indicated. The patient verbalized understanding of the above and had no further questions upon completion of the visit.    I spent 30 minutes dedicated to the care of this patient on the date of this encounter to include pre-visit review of records, face-to-face time with the patient discussing conditions above, post visit ordering of testing, clinical documentation with the electronic health record, making appropriate referrals as documented, and communicating necessary information to the patient's healthcare team.   Bevelyn Ngo, NP 10/28/2021

## 2021-10-28 NOTE — Patient Instructions (Signed)
Thank you for participating in the [place] [facility] Cancer Screening Program. °It was our pleasure to meet you today. °We will call you with the results of your scan within the next few days. °Your scan will be assigned a Lung RADS category score by the physicians reading the scans.  °This Lung RADS score determines follow up scanning.  °See below for description of categories, and follow up screening recommendations. °We will be in touch to schedule your follow up screening annually or based on recommendations of our providers. °We will fax a copy of your scan results to your Primary Care Physician, or the physician who referred you to the program, to ensure they have the results. °Please call the office if you have any questions or concerns regarding your scanning experience or results.  °Our office number is [phone]. °Please speak with [name] Phelps, RN. She is our Lung Cancer Screening RN. °If she is unavailable when you call, please have the office staff send her a message. She will return your call at her earliest convenience. °Remember, if your scan is normal, we will scan you annually as long as you continue to meet the criteria for the program. (Age 55-77, Current smoker or smoker who has quit within the last 15 years). °If you are a smoker, remember, quitting is the single most powerful action that you can take to decrease your risk of lung cancer and other pulmonary, breathing related problems. °We know quitting is hard, and we are here to help.  °Please let us know if there is anything we can do to help you meet your goal of quitting. °If you are a former smoker, congratulations. We are proud of you! Remain smoke free! °Remember you can refer friends or family members through the number above.  °We will screen them to make sure they meet criteria for the program. °Thank you for helping us take better care of you by participating in Lung Screening. ° °You can receive free nicotine replacement therapy  ( patches, gum or mints) by calling 1-800-QUIT NOW. Please call so we can get you on the path to becoming  a non-smoker. I know it is hard, but you can do this! ° °Lung RADS Categories: ° °Lung RADS 1: no nodules or definitely non-concerning nodules.  °Recommendation is for a repeat annual scan in 12 months. ° °Lung RADS 2:  nodules that are non-concerning in appearance and behavior with a very low likelihood of becoming an active cancer. °Recommendation is for a repeat annual scan in 12 months. ° °Lung RADS 3: nodules that are probably non-concerning , includes nodules with a low likelihood of becoming an active cancer.  Recommendation is for a 6-month repeat screening scan. Often noted after an upper respiratory illness. We will be in touch to make sure you have no questions, and to schedule your 6-month scan. ° °Lung RADS 4 A: nodules with concerning findings, recommendation is most often for a follow up scan in 3 months or additional testing based on our provider's assessment of the scan. We will be in touch to make sure you have no questions and to schedule the recommended 3 month follow up scan. ° °Lung RADS 4 B:  indicates findings that are concerning. We will be in touch with you to schedule additional diagnostic testing based on our provider's  assessment of the scan. ° °Hypnosis for smoking cessation  °[employer] °[phone] ° °Acupuncture for smoking cessation  °[facility] °[phone]  °

## 2021-10-30 ENCOUNTER — Other Ambulatory Visit: Payer: Self-pay | Admitting: Family Medicine

## 2021-10-30 ENCOUNTER — Ambulatory Visit: Payer: BC Managed Care – PPO

## 2021-10-30 ENCOUNTER — Ambulatory Visit
Admission: RE | Admit: 2021-10-30 | Discharge: 2021-10-30 | Disposition: A | Discharge status: Home / Self Care | Payer: BC Managed Care – PPO | Source: Ambulatory Visit | Attending: Acute Care | Admitting: Acute Care

## 2021-10-30 DIAGNOSIS — I10 Essential (primary) hypertension: Secondary | ICD-10-CM

## 2021-10-30 DIAGNOSIS — F17210 Nicotine dependence, cigarettes, uncomplicated: Secondary | ICD-10-CM

## 2021-10-30 DIAGNOSIS — I2510 Atherosclerotic heart disease of native coronary artery without angina pectoris: Secondary | ICD-10-CM | POA: Diagnosis not present

## 2021-10-30 DIAGNOSIS — J432 Centrilobular emphysema: Secondary | ICD-10-CM | POA: Diagnosis not present

## 2021-10-30 DIAGNOSIS — Z87891 Personal history of nicotine dependence: Secondary | ICD-10-CM

## 2021-10-30 DIAGNOSIS — I700 Atherosclerosis of aorta: Secondary | ICD-10-CM | POA: Diagnosis not present

## 2021-10-31 ENCOUNTER — Telehealth: Payer: Self-pay | Admitting: Acute Care

## 2021-10-31 DIAGNOSIS — F3174 Bipolar disorder, in full remission, most recent episode manic: Secondary | ICD-10-CM | POA: Diagnosis not present

## 2021-10-31 NOTE — Telephone Encounter (Signed)
Received a call report for patient's lung cancer screening CT. Below is a copy of the impression.   "IMPRESSION: 1. Lung-RADS 3, probably benign findings. Short-term follow-up in 6 months is recommended with repeat low-dose chest CT without contrast (please use the following order, "CT CHEST LCS NODULE FOLLOW-UP W/O CM"). 2. Aortic Atherosclerosis (ICD10-I70.0) and Emphysema (ICD10-J43.9)."  Will forward to Maralyn Sago and the lung nodule pool.

## 2021-10-31 NOTE — Telephone Encounter (Signed)
I have called the patient with the results of his low dose Ct Chest.I explained the scan was read as a Lung  RADS 3, nodules that are probably benign findings, short term follow up suggested: includes nodules with a low likelihood of becoming a clinically active cancer. Radiology recommends a 6 month repeat LDCT follow up. He has a 7.8 mm nodule that we will reassess in 6 months. He is in agreement with this plan. I did ley him know there was an incidental finding of CAD and emphysema. I will let his PCP know about the CAD. I have offered a pulmonary referral if he would like to be seen by a Pulmonary doctor. He will let us know if he wants a pulmonary referral.  Angelique Blonder, please place order for 6 month follow up and fax results to PCP. Thanks so much.

## 2021-11-03 ENCOUNTER — Other Ambulatory Visit: Payer: Self-pay

## 2021-11-03 DIAGNOSIS — R911 Solitary pulmonary nodule: Secondary | ICD-10-CM

## 2021-11-03 NOTE — Telephone Encounter (Signed)
CT results faxed to PCP and order placed for 6 months nodule follow up CT chest

## 2021-11-03 NOTE — Telephone Encounter (Signed)
See other telephone note 10/31/21

## 2021-11-17 DIAGNOSIS — F3174 Bipolar disorder, in full remission, most recent episode manic: Secondary | ICD-10-CM | POA: Diagnosis not present

## 2021-11-17 DIAGNOSIS — F5105 Insomnia due to other mental disorder: Secondary | ICD-10-CM | POA: Diagnosis not present

## 2021-11-17 DIAGNOSIS — F1011 Alcohol abuse, in remission: Secondary | ICD-10-CM | POA: Diagnosis not present

## 2021-12-08 ENCOUNTER — Ambulatory Visit: Payer: Self-pay

## 2021-12-08 NOTE — Telephone Encounter (Signed)
Pt called, states he has not taking tamulosin in 3-4 days now. He hasn't had any issues with urinating so he feels like he doesn't need to continue taking the medication. I advised him I will have Dr. Sherrie Mustache nurse follow up if he needed to continue taking the medication. Pt verbalized understanding.  ? ?Message from Crist Infante sent at 12/08/2021 10:01 AM EDT ? ?Pt wants to know if he needs to continue taking his tamsulosin (FLOMAX) 0.4 MG CAPS capsule?  Pt states he doesn't have any problems going to the bathroom anymore.   ? ?Reason for Disposition ? [1] Caller has NON-URGENT medicine question about med that PCP prescribed AND [2] triager unable to answer question ? ?Answer Assessment - Initial Assessment Questions ?1. NAME of MEDICATION: "What medicine are you calling about?" ?    tamulosin ?2. QUESTION: "What is your question?" (e.g., double dose of medicine, side effect) ?    Wanting to know if he needed to continue taking it ?3. PRESCRIBING HCP: "Who prescribed it?" Reason: if prescribed by specialist, call should be referred to that group. ?    Dr. Sherrie Mustache  ?4. SYMPTOMS: "Do you have any symptoms?" ?    No ? ?Protocols used: Medication Question Call-A-AH ? ?

## 2021-12-16 ENCOUNTER — Telehealth: Payer: Self-pay

## 2021-12-16 NOTE — Telephone Encounter (Signed)
Copied from CRM [id number]. Topic: General - Other ?>> Dec 16, 2021  3:47 PM Jaquita Rector A wrote: ?Reason for CRM: Patient called in very upset that he called a week ago and have been waiting on a call back from Dr Esmond Camper nurse. States that he was contacted by the pharmacy about a refill on his tamsulosin (FLOMAX) 0.4 MG CAPS capsule. Per patient he does not feel like he need to take this medication anymore, please call patient at  Ph# [phone] ?

## 2021-12-16 NOTE — Telephone Encounter (Signed)
This was prescribed when he was in the hospital last year because he could not void and he had to have a foley catheter placed. He can try stopping the medication, but if he has any difficulty peeing than he should start right back on it.  ?

## 2021-12-17 NOTE — Telephone Encounter (Signed)
Pts wife made an additional call regarding this medication since the pt does not want to take it, please advise.  ?

## 2021-12-17 NOTE — Telephone Encounter (Signed)
Patient advised and verbalized understanding 

## 2022-01-26 ENCOUNTER — Telehealth: Payer: Self-pay

## 2022-01-26 DIAGNOSIS — F3112 Bipolar disorder, current episode manic without psychotic features, moderate: Secondary | ICD-10-CM

## 2022-01-26 NOTE — Telephone Encounter (Signed)
Copied from CRM [id number]. Topic: Referral - Request for Referral ?>> Jan 26, 2022  1:39 PM Randol Kern wrote: ?Has patient seen PCP for this complaint? Yes.   ?*If NO, is insurance requiring patient see PCP for this issue before PCP can refer them? ?Referral for which specialty: Therapy ?Preferred provider/office: Highest recommended  ?Reason for referral: Pt needs someone to talk to, recommended by Psychiatrist. ?

## 2022-02-10 DIAGNOSIS — F3174 Bipolar disorder, in full remission, most recent episode manic: Secondary | ICD-10-CM | POA: Diagnosis not present

## 2022-02-10 DIAGNOSIS — F5105 Insomnia due to other mental disorder: Secondary | ICD-10-CM | POA: Diagnosis not present

## 2022-02-10 DIAGNOSIS — F1011 Alcohol abuse, in remission: Secondary | ICD-10-CM | POA: Diagnosis not present

## 2022-02-10 DIAGNOSIS — F411 Generalized anxiety disorder: Secondary | ICD-10-CM | POA: Diagnosis not present

## 2022-02-26 ENCOUNTER — Encounter: Payer: Self-pay | Admitting: Physician Assistant

## 2022-02-26 ENCOUNTER — Ambulatory Visit (INDEPENDENT_AMBULATORY_CARE_PROVIDER_SITE_OTHER): Payer: PPO | Admitting: Physician Assistant

## 2022-02-26 VITALS — BP 101/89 | HR 95 | Temp 97.6°F | Wt 137.0 lb

## 2022-02-26 DIAGNOSIS — E871 Hypo-osmolality and hyponatremia: Secondary | ICD-10-CM | POA: Diagnosis not present

## 2022-02-26 NOTE — Progress Notes (Signed)
Established patient visit   Patient: Eric Hatfield.   DOB: 11/06/1955   66 y.o. Male  MRN: [id number] Visit Date: 02/26/2022  Today's healthcare provider: Alfredia Ferguson, PA-C   Cc. Hyponatremia   Subjective    HPI  Patient is a 66 year old male who presents for follow up of hyponatremia.  He was last seen on 09/19/21. Dr Maryruth Bun has just done a level and it was still low at 125.  Patient states she has discontinued his Tegretol as this is the thought of what is lowering his sodium. Denies headaches, shaking, nausea, vomiting, dizziness, vision changes.  Lab Results  Component Value Date   CREATININE 0.65 05/17/2021   BUN 14 05/17/2021   NA 131 (L) 05/17/2021   K 3.9 05/17/2021   CL 97 (L) 05/17/2021   CO2 28 05/17/2021     Medications: Outpatient Medications Prior to Visit  Medication Sig   amLODipine (NORVASC) 10 MG tablet TAKE 1 TABLET BY MOUTH DAILY   valsartan (DIOVAN) 80 MG tablet TAKE 1 TABLET BY MOUTH DAILY.   divalproex (DEPAKOTE) 500 MG DR tablet TAKE 1 TABLET BY MOUTH 2 TIMES DAILY. (Patient not taking: Reported on 02/26/2022)   QUEtiapine (SEROQUEL) 100 MG tablet Take 1 tablet (100 mg total) by mouth at bedtime. (Patient not taking: Reported on 02/26/2022)   risperiDONE (RISPERDAL) 2 MG tablet TAKE 1 TABLET BY MOUTH 2 TIMES DAILY. (Patient not taking: Reported on 02/26/2022)   tamsulosin (FLOMAX) 0.4 MG CAPS capsule TAKE 1 CAPSULE BY MOUTH EVERY DAY AFTER SUPPER (Patient not taking: Reported on 02/26/2022)   No facility-administered medications prior to visit.    Review of Systems  Respiratory:  Negative for cough and shortness of breath.   Cardiovascular:  Negative for chest pain.  Neurological:  Negative for dizziness and headaches.  Psychiatric/Behavioral:  Negative for confusion.        Objective    BP 101/89 (BP Location: Left Arm, Patient Position: Sitting, Cuff Size: Normal)   Pulse 95   Temp 97.6 F (36.4 C) (Oral)   Wt 137 lb (62.1 kg)    SpO2 98%   BMI 22.11 kg/m    Physical Exam Constitutional:      General: He is awake.     Appearance: He is well-developed.  HENT:     Head: Normocephalic.  Eyes:     Conjunctiva/sclera: Conjunctivae normal.  Cardiovascular:     Rate and Rhythm: Normal rate and regular rhythm.     Heart sounds: Normal heart sounds.  Pulmonary:     Effort: Pulmonary effort is normal.     Breath sounds: Normal breath sounds.  Skin:    General: Skin is warm.  Neurological:     Mental Status: He is alert and oriented to person, place, and time.  Psychiatric:        Attention and Perception: Attention normal.        Mood and Affect: Mood normal.        Speech: Speech normal.        Behavior: Behavior is cooperative.      No results found for any visits on 02/26/22.  Assessment & Plan     Problem List Items Addressed This Visit       Other   Hyponatremia - Primary    Pt had d/c tegretol; advised increasing sodium intake, switching to electrolyte drinks over the weekend Advised of ed precautions Will recheck bmp Monday if dr Maryruth Bun  prefers        Relevant Orders   Basic Metabolic Panel (BMET)     Return bloodwork monday 03/02/2022.      I, Alfredia Ferguson, PA-C have reviewed all documentation for this visit. The documentation on  02/26/2022  for the exam, diagnosis, procedures, and orders are all accurate and complete.  Alfredia Ferguson, PA-C [facility] [address] [place], [place], [address] Office: [phone] Fax: [phone]   [facility]

## 2022-02-26 NOTE — Assessment & Plan Note (Signed)
Pt had d/c tegretol; advised increasing sodium intake, switching to electrolyte drinks over the weekend Advised of ed precautions Will recheck bmp Monday if dr Nicolasa Ducking prefers

## 2022-03-03 LAB — BASIC METABOLIC PANEL
BUN/Creatinine Ratio: 14 (ref 10–24)
BUN: 13 mg/dL (ref 8–27)
CO2: 18 mmol/L — ABNORMAL LOW (ref 20–29)
Calcium: 9.3 mg/dL (ref 8.6–10.2)
Chloride: 99 mmol/L (ref 96–106)
Creatinine, Ser: 0.94 mg/dL (ref 0.76–1.27)
Glucose: 218 mg/dL — ABNORMAL HIGH (ref 70–99)
Potassium: 4.4 mmol/L (ref 3.5–5.2)
Sodium: 134 mmol/L (ref 134–144)
eGFR: 90 mL/min/{1.73_m2} (ref >59)

## 2022-03-13 ENCOUNTER — Ambulatory Visit
Admission: EM | Admit: 2022-03-13 | Discharge: 2022-03-13 | Disposition: A | Discharge status: Home / Self Care | Payer: PPO | Attending: Emergency Medicine | Admitting: Emergency Medicine

## 2022-03-13 ENCOUNTER — Encounter: Payer: Self-pay | Admitting: Emergency Medicine

## 2022-03-13 DIAGNOSIS — T161XXA Foreign body in right ear, initial encounter: Secondary | ICD-10-CM | POA: Diagnosis not present

## 2022-03-13 MED ORDER — CARBAMIDE PEROXIDE 6.5 % OT SOLN: 5.0000 [drp] | Freq: Every day | OTIC | 0 refills | Status: DC | PRN

## 2022-03-13 NOTE — ED Provider Notes (Signed)
Eric Hatfield    CSN: [id number] Arrival date & time: 03/13/22  1607      History   Chief Complaint Chief Complaint  Patient presents with   Ear Clogged    HPI Eric Hatfield. is a 66 y.o. male.   Patient presents with fullness to the right ear, pain and decreased hearing beginning 1 day ago.  Endorses that he has been using peroxide and Q-tip/cotton balls to help clean out ears.  Has not attempted treatment of current symptoms.  Denies ear drainage, ear itching, fever, chills or URI symptoms.  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 Authorizing Provider  carbamide peroxide (DEBROX) 6.5 % OTIC solution Place 5 drops into both ears daily as needed. 03/13/22  Yes Kadie Balestrieri R, NP  amLODipine (NORVASC) 10 MG tablet TAKE 1 TABLET BY MOUTH DAILY 08/01/21   Malva Limes, MD  divalproex (DEPAKOTE) 500 MG DR tablet TAKE 1 TABLET BY MOUTH 2 TIMES DAILY. Patient not taking: Reported on 02/26/2022 08/22/21   Malva Limes, MD  QUEtiapine (SEROQUEL) 100 MG tablet Take 1 tablet (100 mg total) by mouth at bedtime. Patient not taking: Reported on 02/26/2022 05/02/21   Arnetha Courser, MD  risperiDONE (RISPERDAL) 2 MG tablet TAKE 1 TABLET BY MOUTH 2 TIMES DAILY. Patient not  taking: Reported on 02/26/2022 08/22/21   Malva Limes, MD  tamsulosin (FLOMAX) 0.4 MG CAPS capsule TAKE 1 CAPSULE BY MOUTH EVERY DAY AFTER SUPPER Patient not taking: Reported on 02/26/2022 10/08/21   Malva Limes, MD  valsartan (DIOVAN) 80 MG tablet TAKE 1 TABLET BY MOUTH DAILY. 10/30/21   Malva Limes, MD    Family History Family History  Problem Relation Age of Onset   Hypertension Mother    Hyperlipidemia Father    Hypertension Brother    Mental illness Paternal Grandmother     Social History Social History   Tobacco Use   Smoking status: Every Day    Packs/day: 0.75    Years: 30.00    Total pack years: 22.50    Types: Cigarettes    Start date: 05/31/1975   Smokeless tobacco: Never   Tobacco comments:    started smoking as a teenager average 1 ppd  Vaping Use   Vaping Use: Never used  Substance Use Topics   Alcohol use: No    Alcohol/week: 0.0 standard drinks of alcohol    Comment: quit drking in his 40s.    Drug use: No     Allergies   Patient has no known allergies.   Review of Systems Review of Systems Defer to [facility]   Physical Exam Triage Vital Signs ED Triage Vitals  Enc Vitals Group     BP 03/13/22 1637 (!) 145/79  Pulse Rate 03/13/22 1637 91     Resp 03/13/22 1637 16     Temp 03/13/22 1637 98.4 F (36.9 C)     Temp Source 03/13/22 1637 Oral     SpO2 03/13/22 1637 95 %     Weight --      Height --      Head Circumference --      Peak Flow --      Pain Score 03/13/22 1636 0     Pain Loc --      Pain Edu? --      Excl. in GC? --    No data found.  Updated Vital Signs BP (!) 145/79 (BP Location: Right Arm)   Pulse 91   Temp 98.4 F (36.9 C) (Oral)   Resp 16   SpO2 95%   Visual Acuity Right Eye Distance:   Left Eye Distance:   Bilateral Distance:    Right Eye Near:   Left Eye Near:    Bilateral Near:     Physical Exam Constitutional:      Appearance: Normal appearance.  HENT:     Head: Normocephalic.     Left Ear:  Tympanic membrane, ear canal and external ear normal.     Ears:     Comments: Cotton is noted within the right ear canal Eyes:     Extraocular Movements: Extraocular movements intact.  Pulmonary:     Effort: Pulmonary effort is normal.  Neurological:     Mental Status: He is alert and oriented to person, place, and time. Mental status is at baseline.  Psychiatric:        Mood and Affect: Mood normal.        Behavior: Behavior normal.      UC Treatments / Results  Labs (all labs ordered are listed, but only abnormal results are displayed) Labs Reviewed - No data to display  EKG   Radiology No results found.  Procedures Procedures (including critical care time)  Medications Ordered in UC Medications - No data to display  Initial Impression / Assessment and Plan / UC Course  I have reviewed the triage vital signs and the nursing notes.  Pertinent labs & imaging results that were available during my care of the patient were reviewed by me and considered in my medical decision making (see chart for details).  Acute foreign body of right ear canal, initial encounter  Foreign body removed through ear irrigation by nursing staff, on reevaluation there is no abnormality noted within the ear canal or to the tympanic membrane, discussed findings with patient, prescribed Debrox drops to be used daily as needed for further ear cleaning, advised against any placement of objects within the ear canal to prevent reoccurrence of symptoms, may follow-up with this urgent care as needed for reevaluation Final Clinical Impressions(s) / UC Diagnoses   Final diagnoses:  Acute foreign body of right ear canal, initial encounter     Discharge Instructions      Object has been removed from your ear canal, appears to be a piece of cotton  Moving forward you may attempt use of Debrox drops  daily as needed which can be purchased over-the-counter, place about 5 drops into the ear wait 5 to 10  minutes and then use a cottonball externally to remove wax, do not push cotton down into the ear canal  You may follow-up with this urgent care as needed if symptoms recur for   ED Prescriptions  Medication Sig Dispense Auth. Provider   carbamide peroxide (DEBROX) 6.5 % OTIC solution Place 5 drops into both ears daily as needed. 15 mL Freya Zobrist, Elita Boone, NP      PDMP not reviewed this encounter.   Valinda Hoar, NP 03/13/22 [phone]

## 2022-03-18 ENCOUNTER — Ambulatory Visit: Payer: BC Managed Care – PPO | Admitting: Family Medicine

## 2022-03-26 NOTE — Progress Notes (Signed)
    I,Roshena L Chambers,acting as a scribe for  , MD.,have documented all relevant documentation on the behalf of  , MD,as directed by   , MD while in the presence of  , MD.   Established patient visit   Patient: Eric Hatfield.   DOB: 02/05/1956   66 y.o. Male  MRN: [id number] Visit Date: 03/27/2022  Today's healthcare provider:  , MD   Chief Complaint  Patient presents with   Hypertension   Ear Fullness   Subjective    HPI  Hypertension, follow-up  BP Readings from Last 3 Encounters:  03/27/22 120/84  03/13/22 (!) 145/79  02/26/22 101/89   Wt Readings from Last 3 Encounters:  03/27/22 135 lb (61.2 kg)  02/26/22 137 lb (62.1 kg)  09/19/21 146 lb 11.2 oz (66.5 kg)     He was last seen for hypertension 6 months ago.  BP at that visit was 123/76. Management since that visit includes continue same medication.  He reports good compliance with treatment. He is not having side effects.  He is following a Regular diet. He is not exercising. He does smoke.  Use of agents associated with hypertension: none.   Outside blood pressures are not checked. Symptoms: No chest pain No chest pressure  No palpitations No syncope  No dyspnea No orthopnea  No paroxysmal nocturnal dyspnea No lower extremity edema   Pertinent labs Lab Results  Component Value Date   CHOL 211 (H) 06/23/2019   HDL 82 06/23/2019   LDLCALC 122 (H) 06/23/2019   TRIG 40 06/23/2019   CHOLHDL 2.6 06/23/2019   Lab Results  Component Value Date   NA 134 03/02/2022   K 4.4 03/02/2022   CREATININE 0.94 03/02/2022   EGFR 90 03/02/2022   GLUCOSE 218 (H) 03/02/2022   TSH 1.228 05/02/2021     The 10-year ASCVD risk score (Arnett DK, et al., 2019) is: 14.3%  ---------------------------------------------------------------------------------------------------   Follow up urgent care visit:  Patient was seen at an urgent care for fullness  of right ear, pain and decreased hearing on 03/13/2022. He was treated for acute foreign body of right ear canal. Treatment for this included an ear irrigation and prescribing Dubrox ear solution. He reports good compliance with treatment. He reports this condition is Improved. Patient still feels fullness in his ear  -----------------------------------------------------------------------------------------   Medications: Outpatient Medications Prior to Visit  Medication Sig   amLODipine (NORVASC) 10 MG tablet TAKE 1 TABLET BY MOUTH DAILY   carbamide peroxide (DEBROX) 6.5 % OTIC solution Place 5 drops into both ears daily as needed.   divalproex (DEPAKOTE) 500 MG DR tablet TAKE 1 TABLET BY MOUTH 2 TIMES DAILY.   valsartan (DIOVAN) 80 MG tablet TAKE 1 TABLET BY MOUTH DAILY.   QUEtiapine (SEROQUEL) 100 MG tablet Take 1 tablet (100 mg total) by mouth at bedtime. (Patient not taking: Reported on 03/27/2022)   risperiDONE (RISPERDAL) 2 MG tablet TAKE 1 TABLET BY MOUTH 2 TIMES DAILY. (Patient not taking: Reported on 03/27/2022)   [DISCONTINUED] tamsulosin (FLOMAX) 0.4 MG CAPS capsule TAKE 1 CAPSULE BY MOUTH EVERY DAY AFTER SUPPER (Patient not taking: Reported on 03/27/2022)   No facility-administered medications prior to visit.    Review of Systems  Constitutional:  Negative for appetite change, chills and fever.  HENT:         Right ear fullness  Respiratory:  Negative for chest tightness, shortness of breath and wheezing.   Cardiovascular:  Negative for chest   pain and palpitations.  Gastrointestinal:  Negative for abdominal pain, nausea and vomiting.       Objective    BP 120/84 (BP Location: Left Arm, Patient Position: Sitting, Cuff Size: Normal)   Pulse 85   Temp 98.4 F (36.9 C) (Oral)   Resp 16   Wt 135 lb (61.2 kg)   SpO2 97% Comment: room air  BMI 21.79 kg/m    Physical Exam    General: Appearance:    Well developed, well nourished male in no acute distress  ENT:   Right  canal obstructed by cerumen  Eyes:    PERRL, conjunctiva/corneas clear, EOM's intact       Lungs:     Clear to auscultation bilaterally, respirations unlabored  Heart:    Normal heart rate. Normal rhythm.  2/6 systolic murmur at right upper sternal border   MS:   All extremities are intact.    Neurologic:   Awake, alert, oriented x 3. No apparent focal neurological defect.         Assessment & Plan     1. Essential hypertension Remains well controlled.   2. Hyponatremia Resolved since carbamazepine discontinue by Dr. Nicolasa Ducking. Is to follow up with her as scheduled.   3. Impacted cerumen of right ear After soaking with Debrox, ear canal was irrigated with water until clear. Patient tolerated procedure well.        The entirety of the information documented in the History of Present Illness, Review of Systems and Physical Exam were personally obtained by me. Portions of this information were initially documented by the CMA and reviewed by me for thoroughness and accuracy.     Lelon Huh, MD  [facility] [phone] (phone) [phone] (fax)  [place]

## 2022-03-27 ENCOUNTER — Encounter: Payer: Self-pay | Admitting: Family Medicine

## 2022-03-27 ENCOUNTER — Ambulatory Visit: Payer: Self-pay

## 2022-03-27 ENCOUNTER — Ambulatory Visit (INDEPENDENT_AMBULATORY_CARE_PROVIDER_SITE_OTHER): Payer: PPO | Admitting: Family Medicine

## 2022-03-27 VITALS — BP 120/84 | HR 85 | Temp 98.4°F | Resp 16 | Wt 135.0 lb

## 2022-03-27 DIAGNOSIS — H60501 Unspecified acute noninfective otitis externa, right ear: Secondary | ICD-10-CM

## 2022-03-27 DIAGNOSIS — E871 Hypo-osmolality and hyponatremia: Secondary | ICD-10-CM | POA: Diagnosis not present

## 2022-03-27 DIAGNOSIS — I10 Essential (primary) hypertension: Secondary | ICD-10-CM | POA: Diagnosis not present

## 2022-03-27 DIAGNOSIS — H6121 Impacted cerumen, right ear: Secondary | ICD-10-CM | POA: Diagnosis not present

## 2022-03-27 MED ORDER — NEOMYCIN-POLYMYXIN-HC 3.5-10000-1 OT SOLN: 3.0000 [drp] | Freq: Four times a day (QID) | OTIC | 0 refills | Status: DC

## 2022-03-27 NOTE — Telephone Encounter (Signed)
Pt is calling for advice - pt was told to take an antibiotic for his ear. However, one was not sent in. Pt states that the debrox has not helped his ear in the past. Please advise    Chief Complaint: Right ear ache. Seen today. States Debrox "does not help me. I thought I was getting an antibiotic." Symptoms: Pain Frequency: 2 weeks Pertinent Negatives: Patient denies  Disposition: [] ED /[] Urgent Care (no appt availability in office) / [] Appointment(In office/virtual)/ []  [place] [facility]/ [] Home Care/ [] Refused Recommended Disposition /[] [facility]/ [x]  Follow-up with PCP Additional Notes: Please advise pt.  Answer Assessment - Initial Assessment Questions 1. LOCATION: "Which ear is involved?"     Right  2. ONSET: "When did the ear start hurting"      2 weeks ago 3. SEVERITY: "How bad is the pain?"  (Scale 1-10; mild, moderate or severe)   - MILD (1-3): doesn't interfere with normal activities    - MODERATE (4-7): interferes with normal activities or awakens from sleep    - SEVERE (8-10): excruciating pain, unable to do any normal activities      Moderate 4. URI SYMPTOMS: "Do you have a runny nose or cough?"     No 5. FEVER: "Do you have a fever?" If Yes, ask: "What is your temperature, how was it measured, and when did it start?"     No 6. CAUSE: "Have you been swimming recently?", "How often do you use Q-TIPS?", "Have you had any recent air travel or scuba diving?"     Unsure 7. OTHER SYMPTOMS: "Do you have any other symptoms?" (e.g., headache, stiff neck, dizziness, vomiting, runny nose, decreased hearing)     No 8. PREGNANCY: "Is there any chance you are pregnant?" "When was your last menstrual period?"     N/A  Protocols used: 

## 2022-04-03 ENCOUNTER — Encounter: Payer: Self-pay | Admitting: Emergency Medicine

## 2022-04-03 ENCOUNTER — Observation Stay
Admission: EM | Admit: 2022-04-03 | Discharge: 2022-04-07 | Disposition: A | Discharge status: Home / Self Care | Payer: PPO | Attending: Internal Medicine | Admitting: Internal Medicine

## 2022-04-03 ENCOUNTER — Emergency Department: Payer: PPO

## 2022-04-03 DIAGNOSIS — F319 Bipolar disorder, unspecified: Secondary | ICD-10-CM | POA: Diagnosis not present

## 2022-04-03 DIAGNOSIS — I10 Essential (primary) hypertension: Secondary | ICD-10-CM | POA: Diagnosis not present

## 2022-04-03 DIAGNOSIS — Z79899 Other long term (current) drug therapy: Secondary | ICD-10-CM | POA: Diagnosis not present

## 2022-04-03 DIAGNOSIS — F3112 Bipolar disorder, current episode manic without psychotic features, moderate: Secondary | ICD-10-CM | POA: Diagnosis not present

## 2022-04-03 DIAGNOSIS — Z87891 Personal history of nicotine dependence: Secondary | ICD-10-CM | POA: Diagnosis not present

## 2022-04-03 DIAGNOSIS — F17200 Nicotine dependence, unspecified, uncomplicated: Secondary | ICD-10-CM | POA: Diagnosis present

## 2022-04-03 DIAGNOSIS — R4182 Altered mental status, unspecified: Secondary | ICD-10-CM | POA: Diagnosis present

## 2022-04-03 DIAGNOSIS — E871 Hypo-osmolality and hyponatremia: Secondary | ICD-10-CM | POA: Diagnosis present

## 2022-04-03 DIAGNOSIS — E222 Syndrome of inappropriate secretion of antidiuretic hormone: Secondary | ICD-10-CM | POA: Diagnosis present

## 2022-04-03 DIAGNOSIS — F3110 Bipolar disorder, current episode manic without psychotic features, unspecified: Secondary | ICD-10-CM | POA: Diagnosis present

## 2022-04-03 DIAGNOSIS — R410 Disorientation, unspecified: Secondary | ICD-10-CM

## 2022-04-03 DIAGNOSIS — F17210 Nicotine dependence, cigarettes, uncomplicated: Secondary | ICD-10-CM | POA: Diagnosis present

## 2022-04-03 LAB — COMPREHENSIVE METABOLIC PANEL
ALT: 17 U/L (ref 0–44)
AST: 25 U/L (ref 15–41)
Albumin: 3.8 g/dL (ref 3.5–5.0)
Alkaline Phosphatase: 110 U/L (ref 38–126)
Anion gap: 6 (ref 5–15)
BUN: 18 mg/dL (ref 8–23)
CO2: 25 mmol/L (ref 22–32)
Calcium: 8.8 mg/dL — ABNORMAL LOW (ref 8.9–10.3)
Chloride: 100 mmol/L (ref 98–111)
Creatinine, Ser: 1.02 mg/dL (ref 0.61–1.24)
GFR, Estimated: >60 mL/min (ref >60)
Glucose, Bld: 270 mg/dL — ABNORMAL HIGH (ref 70–99)
Potassium: 4.3 mmol/L (ref 3.5–5.1)
Sodium: 131 mmol/L — ABNORMAL LOW (ref 135–145)
Total Bilirubin: 0.6 mg/dL (ref 0.3–1.2)
Total Protein: 7.1 g/dL (ref 6.5–8.1)

## 2022-04-03 LAB — OSMOLALITY: Osmolality: 285 mOsm/kg (ref 275–295)

## 2022-04-03 LAB — CBC
HCT: 39.8 % (ref 39.0–52.0)
Hemoglobin: 13.5 g/dL (ref 13.0–17.0)
MCH: 33.4 pg (ref 26.0–34.0)
MCHC: 33.9 g/dL (ref 30.0–36.0)
MCV: 98.5 fL (ref 80.0–100.0)
Platelets: 283 10*3/uL (ref 150–400)
RBC: 4.04 MIL/uL — ABNORMAL LOW (ref 4.22–5.81)
RDW: 12.7 % (ref 11.5–15.5)
WBC: 7 10*3/uL (ref 4.0–10.5)
nRBC: 0 % (ref 0.0–0.2)

## 2022-04-03 LAB — VITAMIN B12: Vitamin B-12: 1362 pg/mL — ABNORMAL HIGH (ref 180–914)

## 2022-04-03 LAB — URINE DRUG SCREEN, QUALITATIVE (ARMC ONLY)
Amphetamines, Ur Screen: NOT DETECTED
Barbiturates, Ur Screen: NOT DETECTED
Benzodiazepine, Ur Scrn: NOT DETECTED
Cannabinoid 50 Ng, Ur ~~LOC~~: NOT DETECTED
Cocaine Metabolite,Ur ~~LOC~~: NOT DETECTED
MDMA (Ecstasy)Ur Screen: NOT DETECTED
Methadone Scn, Ur: NOT DETECTED
Opiate, Ur Screen: NOT DETECTED
Phencyclidine (PCP) Ur S: NOT DETECTED
Tricyclic, Ur Screen: NOT DETECTED

## 2022-04-03 LAB — URINALYSIS, ROUTINE W REFLEX MICROSCOPIC
Bilirubin Urine: NEGATIVE
Glucose, UA: 150 mg/dL — AB
Ketones, ur: NEGATIVE mg/dL
Leukocytes,Ua: NEGATIVE
Nitrite: NEGATIVE
Protein, ur: NEGATIVE mg/dL
Specific Gravity, Urine: 1.005 (ref 1.005–1.030)
Squamous Epithelial / HPF: NONE SEEN (ref 0–5)
pH: 6 (ref 5.0–8.0)

## 2022-04-03 LAB — VALPROIC ACID LEVEL
Valproic Acid Lvl: <10 ug/mL — ABNORMAL LOW (ref 50.0–100.0)
Valproic Acid Lvl: <10 ug/mL — ABNORMAL LOW (ref 50.0–100.0)

## 2022-04-03 LAB — OSMOLALITY, URINE: Osmolality, Ur: 233 mOsm/kg — ABNORMAL LOW (ref 300–900)

## 2022-04-03 LAB — CBG MONITORING, ED: Glucose-Capillary: 144 mg/dL — ABNORMAL HIGH (ref 70–99)

## 2022-04-03 LAB — ETHANOL: Alcohol, Ethyl (B): <10 mg/dL (ref <10)

## 2022-04-03 LAB — TSH: TSH: 0.842 u[IU]/mL (ref 0.350–4.500)

## 2022-04-03 LAB — PROCALCITONIN: Procalcitonin: <0.1 ng/mL

## 2022-04-03 LAB — GLUCOSE, CAPILLARY: Glucose-Capillary: 124 mg/dL — ABNORMAL HIGH (ref 70–99)

## 2022-04-03 LAB — SODIUM, URINE, RANDOM: Sodium, Ur: 41 mmol/L

## 2022-04-03 MED ORDER — ONDANSETRON HCL 4 MG PO TABS: 4.0000 mg | ORAL_TABLET | Freq: Four times a day (QID) | ORAL | Status: DC | PRN

## 2022-04-03 MED ORDER — ACETAMINOPHEN 650 MG RE SUPP: 650.0000 mg | Freq: Four times a day (QID) | RECTAL | Status: DC | PRN

## 2022-04-03 MED ORDER — ENOXAPARIN SODIUM 40 MG/0.4ML IJ SOSY
40.0000 mg | PREFILLED_SYRINGE | Freq: Every day | INTRAMUSCULAR | Status: DC
  Administered 2022-04-04 – 2022-04-05 (×2): 40 mg via SUBCUTANEOUS
  Filled 2022-04-03 (×3): qty 0.4

## 2022-04-03 MED ORDER — ONDANSETRON HCL 4 MG/2ML IJ SOLN: 4.0000 mg | Freq: Four times a day (QID) | INTRAMUSCULAR | Status: DC | PRN

## 2022-04-03 MED ORDER — INSULIN ASPART 100 UNIT/ML IJ SOLN: 0.0000 [IU] | Freq: Every day | INTRAMUSCULAR | Status: DC

## 2022-04-03 MED ORDER — SENNOSIDES-DOCUSATE SODIUM 8.6-50 MG PO TABS: 1.0000 | ORAL_TABLET | Freq: Every evening | ORAL | Status: DC | PRN

## 2022-04-03 MED ORDER — POLYETHYLENE GLYCOL 3350 17 G PO PACK
17.0000 g | PACK | Freq: Two times a day (BID) | ORAL | Status: DC | PRN
Start: 2022-04-03 — End: 2022-04-07

## 2022-04-03 MED ORDER — BUSPIRONE HCL 10 MG PO TABS
10.0000 mg | ORAL_TABLET | Freq: Two times a day (BID) | ORAL | Status: DC
  Administered 2022-04-03 – 2022-04-07 (×8): 10 mg via ORAL
  Filled 2022-04-03 (×8): qty 1

## 2022-04-03 MED ORDER — NICOTINE 21 MG/24HR TD PT24: 21.0000 mg | MEDICATED_PATCH | Freq: Every day | TRANSDERMAL | Status: DC | PRN

## 2022-04-03 MED ORDER — INSULIN ASPART 100 UNIT/ML IJ SOLN
0.0000 [IU] | Freq: Three times a day (TID) | INTRAMUSCULAR | Status: DC
  Administered 2022-04-04: 3 [IU] via SUBCUTANEOUS
  Administered 2022-04-04: 2 [IU] via SUBCUTANEOUS
  Filled 2022-04-03 (×3): qty 1

## 2022-04-03 MED ORDER — IRBESARTAN 150 MG PO TABS
75.0000 mg | ORAL_TABLET | Freq: Every day | ORAL | Status: DC
  Administered 2022-04-04 – 2022-04-07 (×4): 75 mg via ORAL
  Filled 2022-04-03 (×4): qty 1

## 2022-04-03 MED ORDER — DIVALPROEX SODIUM 500 MG PO DR TAB
500.0000 mg | DELAYED_RELEASE_TABLET | Freq: Two times a day (BID) | ORAL | Status: DC
  Administered 2022-04-06 – 2022-04-07 (×3): 500 mg via ORAL
  Filled 2022-04-03 (×6): qty 1

## 2022-04-03 MED ORDER — AMLODIPINE BESYLATE 10 MG PO TABS
10.0000 mg | ORAL_TABLET | Freq: Every day | ORAL | Status: DC
  Administered 2022-04-04 – 2022-04-07 (×4): 10 mg via ORAL
  Filled 2022-04-03 (×4): qty 1

## 2022-04-03 MED ORDER — OLANZAPINE 5 MG PO TABS
5.0000 mg | ORAL_TABLET | Freq: Every day | ORAL | Status: DC
Start: 2022-04-03 — End: 2022-04-07
  Administered 2022-04-03 – 2022-04-06 (×4): 5 mg via ORAL
  Filled 2022-04-03 (×4): qty 1

## 2022-04-03 MED ORDER — HYDRALAZINE HCL 20 MG/ML IJ SOLN: 5.0000 mg | Freq: Four times a day (QID) | INTRAMUSCULAR | Status: AC | PRN

## 2022-04-03 MED ORDER — ACETAMINOPHEN 325 MG PO TABS
650.0000 mg | ORAL_TABLET | Freq: Four times a day (QID) | ORAL | Status: DC | PRN
  Administered 2022-04-05: 650 mg via ORAL
  Filled 2022-04-03: qty 2

## 2022-04-03 NOTE — Assessment & Plan Note (Addendum)
>>  ASSESSMENT AND PLAN FOR SMOKING GREATER THAN 30 PACK YEARS WRITTEN ON 04/03/2022  3:03 PM BY COX, AMY N, DO  - As needed nicotine patch ordered for nicotine craving   >>ASSESSMENT AND PLAN FOR TOBACCO USE DISORDER WRITTEN ON 04/03/2022  3:03 PM BY COX, AMY N, DO  - As needed nicotine patch ordered for nicotine craving

## 2022-04-03 NOTE — Assessment & Plan Note (Signed)
-   Hydralazine 5 mg IV every 6 hours as needed for SBP greater than 175, 3 days or

## 2022-04-03 NOTE — ED Notes (Signed)
Pt. And spouse provided multiple drinks, cups of ice and snacks, per request.

## 2022-04-03 NOTE — ED Notes (Signed)
Patient transported to [facility] 

## 2022-04-03 NOTE — Assessment & Plan Note (Addendum)
-   I have resumed home Depakote 500 mg p.o. twice daily - I have not resumed patient's home Seroquel or risperidone - Would appreciate further recommendations from behavioral regarding Seroquel and risperidone resumption - Pending BMP in the a.m., patient is likely appropriate candidate for behavioral health service

## 2022-04-03 NOTE — H&P (Addendum)
History and Physical   Leon A Mayo Ao. MKL:[id number] DOB: [birth year] DOA: 04/03/2022  PCP: Malva Limes, MD Patient coming from: Home via EMS  I have personally briefly reviewed patient's old medical records in [facility] EMR.  Chief Concern: Altered mental status and hallucination  HPI: [name] is a 66 year old male with history of hypertension, tobacco use, history of hyponatremia, bipolar affective disorder, history of hallucinations, who presents emergency department for chief concerns of altered mental status and hallucinations.  Initial vitals in the emergency department showed temperature of 97.6, respiration rate of 19, heart rate of 109, blood pressure 121/77, SPO2 of 97% on room air.  Serum sodium is 131, potassium 4.3, chloride 100, bicarb 25, BUN of 18, serum creatinine 1.02, GFR greater than 60, nonfasting blood glucose 170, WBC 7.0, hemoglobin 13.5, platelets of 283.  EtOH level was negative, valproic acid level was negative, UA was negative for leukocytes and nitrates.  EDP consulted behavioral health service.  ED treatment: None  At bedside patient was able to tell me his full name, his age, the current calendar year and he knows he is in the hospital.  In addition, he has mild pressured speech and flight of ideas.  He reports that over the last 2 days he has been experiencing auditory hallucination.  He keeps hearing voices behind him and to his left into his right.  He denies any visual hallucination, known fever, nausea, vomiting, chest pain, shortness of breath, dysuria, hematuria.  Of note he self discontinued his home Depakote about 3 days ago.  He states that it may be causing him to have high serum sodium.  He thinks he may be drinking too much water.  In addition, he also discontinued his Seroquel and risperidone.  He states that his wife looked up Seroquel and states it has cocaine in it therefore he stopped taking it.  He was not able to tell  me why he stopped risperidone.  He denies suicidal and/or homicidal ideation.  Social history: He lives at home with his wife.  He denies tobacco, EtOH, recreational drug use.  ROS: Constitutional: no weight change, no fever ENT/Mouth: no sore throat, no rhinorrhea Eyes: no eye pain, no vision changes Cardiovascular: no chest pain, no dyspnea,  no edema, no palpitations Respiratory: no cough, no sputum, no wheezing Gastrointestinal: no nausea, no vomiting, no diarrhea, no constipation Genitourinary: no urinary incontinence, no dysuria, no hematuria Musculoskeletal: no arthralgias, no myalgias Skin: no skin lesions, no pruritus, Neuro: no weakness, no loss of consciousness, no syncope Psych: no anxiety, no depression, no decrease appetite Heme/Lymph: no bruising, no bleeding  ED Course: Discussed with emergency medicine provider, patient requiring hospitalization for chief concerns of altered mental status.  Assessment/Plan  Principal Problem:   Bipolar affective disorder, current episode manic (HCC) Active Problems:   Hyponatremia   Smoking greater than 30 pack years   Essential hypertension   Tobacco use disorder   SIADH (syndrome of inappropriate ADH production) (HCC)   Altered mental status   Assessment and Plan:  * Bipolar affective disorder, current episode manic (HCC) - I have resumed home Depakote 500 mg p.o. twice daily - I have not resumed patient's home Seroquel or risperidone - Would appreciate further recommendations from behavioral regarding Seroquel and risperidone resumption - Pending BMP in the a.m., patient is likely appropriate candidate for behavioral health service  Altered mental status - Low clinical suspicion or indication that patient's serum sodium level is slightly etiology for  his altered mental status - Given my evaluation of patient, I suspect the patient's altered mental status and hallucinations secondary to self discontinuation of  psychiatric medications without the benefit and guidance of his behavioral health specialist - AM team to transition primary care to behavioral health as appropriate versus discharge to facility versus home - Given his history of bipolar affective disorder, and reported not taking his home quetiapine and risperidone, this may be the etiology of his hallucinations/altered mental status - Given this, we would appreciate psychiatric/behavioral health consultation as patient is being admitted to the hospital - We will also check for TSH, B12, UDS, procalcitonin, for completeness however I do not suspect that patient has an infectious/medical etiology for his hallucination and/or confusion - TOC consulted for possible SNF/home health needs - In setting of acute hallucination and altered mental status, has not ordered PT, OT - A.m. team to order PT, OT as needed - Admit to MedSurg, observation  Tobacco use disorder - As needed nicotine patch ordered for nicotine craving  Essential hypertension - Hydralazine 5 mg IV every 6 hours as needed for SBP greater than 175, 3 days or  Smoking greater than 30 pack years - As needed nicotine patch ordered for nicotine craving  Hyponatremia - Mild - Resumed regular diet - BMP in the a.m.  Chart reviewed.   DVT prophylaxis: Enoxaparin 40 mg subcutaneous nightly Code Status: Full code Diet: Regular diet Family Communication: No Disposition Plan: Pending clinical course Consults called: Behavioral health Admission status: MedSurg, observation no telemetry  Past Medical History:  Diagnosis Date   Bipolar disorder (HCC)    Hypertension    Past Surgical History:  Procedure Laterality Date   Impacted tooth     Social History:  reports that he has quit smoking. His smoking use included cigarettes. He started smoking about 46 years ago. He has a 22.50 pack-year smoking history. He has never used smokeless tobacco. He reports that he does not currently  use alcohol. He reports that he does not currently use drugs.  No Known Allergies Family History  Problem Relation Age of Onset   Hypertension Mother    Hyperlipidemia Father    Hypertension Brother    Mental illness Paternal Grandmother    Family history: Family history reviewed and not pertinent.  Prior to Admission medications   Medication Sig Start Date End Date Taking? Authorizing Provider  amLODipine (NORVASC) 10 MG tablet TAKE 1 TABLET BY MOUTH DAILY 08/01/21   Malva Limes, MD  carbamide peroxide (DEBROX) 6.5 % OTIC solution Place 5 drops into both ears daily as needed. 03/13/22   White, Elita Boone, NP  divalproex (DEPAKOTE) 500 MG DR tablet TAKE 1 TABLET BY MOUTH 2 TIMES DAILY. 08/22/21   Malva Limes, MD  neomycin-polymyxin-hydrocortisone (CORTISPORIN) OTIC solution Place 3 drops into the right ear 4 (four) times daily for 7 days. 03/27/22 04/03/22  Malva Limes, MD  QUEtiapine (SEROQUEL) 100 MG tablet Take 1 tablet (100 mg total) by mouth at bedtime. Patient not taking: Reported on 03/27/2022 05/02/21   Arnetha Courser, MD  risperiDONE (RISPERDAL) 2 MG tablet TAKE 1 TABLET BY MOUTH 2 TIMES DAILY. Patient not taking: Reported on 03/27/2022 08/22/21   Malva Limes, MD  valsartan (DIOVAN) 80 MG tablet TAKE 1 TABLET BY MOUTH DAILY. 10/30/21   Malva Limes, MD   Physical Exam: Vitals:   04/03/22 1230 04/03/22 1405 04/03/22 1500 04/03/22 1600  BP: 127/75 112/66 135/79 124/70  Pulse: 91  93 81 82  Resp: (!) 23 12 19 20   Temp:      TempSrc:      SpO2: 93% 95% 97% 95%  Weight:      Height:       Constitutional: appears age-appropriate, NAD, calm, comfortable Eyes: PERRL, lids and conjunctivae normal ENMT: Mucous membranes are moist. Posterior pharynx clear of any exudate or lesions. Age-appropriate dentition. Hearing appropriate Neck: normal, supple, no masses, no thyromegaly Respiratory: clear to auscultation bilaterally, no wheezing, no crackles. Normal respiratory  effort. No accessory muscle use.  Cardiovascular: Regular rate and rhythm, no murmurs / rubs / gallops. No extremity edema. 2+ pedal pulses. No carotid bruits.  Abdomen: no tenderness, no masses palpated, no hepatosplenomegaly. Bowel sounds positive.  Musculoskeletal: no clubbing / cyanosis. No joint deformity upper and lower extremities. Good ROM, no contractures, no atrophy. Normal muscle tone.  Skin: no rashes, lesions, ulcers. No induration Neurologic: Sensation intact. Strength 5/5 in all 4.  Psychiatric: Currently lacks udgment and insight. Alert and oriented x 3.  Erratic mood.  Flight of ideas.  EKG: independently reviewed, showing sinus tachycardia with rate of 103, QTc 410  Chest x-Elhadj on Admission: I personally reviewed and I agree with radiologist reading as below.  CT HEAD WO CONTRAST ( )  Result Date: 04/03/2022 CLINICAL DATA:  Mental status change, unknown cause EXAM: CT HEAD WITHOUT CONTRAST TECHNIQUE: Contiguous axial images were obtained from the base of the skull through the vertex without intravenous contrast. RADIATION DOSE REDUCTION: This exam was performed according to the departmental dose-optimization program which includes automated exposure control, adjustment of the mA and/or kV according to patient size and/or use of iterative reconstruction technique. COMPARISON:  None Available. FINDINGS: Brain: There is no acute intracranial hemorrhage, mass effect, or edema. Gray-white differentiation is preserved. There is no extra-axial fluid collection. Ventricles and sulci are within normal limits in size and configuration. Patchy hypoattenuation in the supratentorial white matter is nonspecific but may reflect mild chronic microvascular ischemic changes. Vascular: There is atherosclerotic calcification at the skull base. Skull: Calvarium is unremarkable. Sinuses/Orbits: Patchy mucosal thickening.  Orbits are unremarkable. Other: None. IMPRESSION: No acute intracranial  abnormality. Electronically Signed   By: 04/05/2022 M.D.   On: 04/03/2022 10:43   DG Chest 2 View  Result Date: 04/03/2022 CLINICAL DATA:  Weakness with altered mental status for a few days. EXAM: CHEST - 2 VIEW COMPARISON:  Chest CT 10/30/2021.  No prior radiographs available. FINDINGS: The heart size and mediastinal contours are normal. The lungs are clear. There is no pleural effusion or pneumothorax. No acute osseous findings are identified. Asymmetric glenohumeral degenerative changes on the left and mild spondylosis are noted. Telemetry leads overlie the chest. IMPRESSION: No evidence of active cardiopulmonary process. Electronically Signed   By: 12/28/2021 M.D.   On: 04/03/2022 10:35    Labs on Admission: I have personally reviewed following labs  CBC: Recent Labs  Lab 04/03/22 0957  WBC 7.0  HGB 13.5  HCT 39.8  MCV 98.5  PLT 283   Basic Metabolic Panel: Recent Labs  Lab 04/03/22 0957  NA 131*  K 4.3  CL 100  CO2 25  GLUCOSE 270*  BUN 18  CREATININE 1.02  CALCIUM 8.8*   GFR: Estimated Creatinine Clearance: 62.3 mL/min (by C-G formula based on SCr of 1.02 mg/dL).  Liver Function Tests: Recent Labs  Lab 04/03/22 0957  AST 25  ALT 17  ALKPHOS 110  BILITOT 0.6  PROT 7.1  ALBUMIN 3.8   Urine analysis:    Component Value Date/Time   COLORURINE STRAW (A) 04/03/2022 1109   APPEARANCEUR CLEAR (A) 04/03/2022 1109   LABSPEC 1.005 04/03/2022 1109   PHURINE 6.0 04/03/2022 1109   GLUCOSEU 150 (A) 04/03/2022 1109   HGBUR SMALL (A) 04/03/2022 1109   BILIRUBINUR NEGATIVE 04/03/2022 1109   KETONESUR NEGATIVE 04/03/2022 1109   PROTEINUR NEGATIVE 04/03/2022 1109   NITRITE NEGATIVE 04/03/2022 1109   LEUKOCYTESUR NEGATIVE 04/03/2022 1109   Dr. Sedalia Muta [facility]  If 7PM-7AM, please contact overnight-coverage provider If 7AM-7PM, please contact day coverage provider [email or web address]  04/03/2022, 5:19 PM

## 2022-04-03 NOTE — Assessment & Plan Note (Signed)
-   As needed nicotine patch ordered for nicotine craving 

## 2022-04-03 NOTE — Consult Note (Incomplete)
[facility] Face-to-Face Psychiatry Consult   Reason for Consult:  hallucinations Referring Physician:  EDP Patient Identification: Eric Hatfield. MRN:  [id number] Principal Diagnosis: Bipolar affective disorder, current episode manic (HCC) Diagnosis:  Principal Problem:   Bipolar affective disorder, current episode manic (HCC) Active Problems:   Hyponatremia   Smoking greater than 30 pack years   Essential hypertension   Tobacco use disorder   SIADH (syndrome of inappropriate ADH production) (HCC)   Altered mental status   Total Time spent with patient: 1 hour  Subjective:   Eric Hatfield. is a 66 y.o. male patient admitted with AMS.  HPI:  66 yo male admitted with AMS, psych consult for hallucinations.  In the ED, the client complained of hearing voices, could not distinguish what the voices were saying.  He was confused at times and asked that we speak to his wife or mother to obtain medication information.  His sleep has been poor and he stopped taking Depakote a few days ago.  He reports his psychiatrist changed his medications.  Today, his wife is visiting and states it was changed to olanzapine.  His hallucinations have been occurring for the past 3 weeks to a month with no apparent trigger.  He was provided with sleep medication by his provider but "was scared to take it.  I was afraid I would not wake up."  Increase in energy regardless of lack of sleep, history of bipolar disorder.  He is agreeable to try Remeron tonight after explaining the mechanism of the medication.  No suicidal or homicidal ideations or substance abuse.  Discussed geriatric psych admission when bed available to assist with his sleep and hallucinations.  Past Psychiatric History: bipolar disorder  Risk to Self:  none Risk to Others:  none Prior Inpatient Therapy:  none Prior Outpatient Therapy:  Dr Maryruth Bun  Past Medical History:  Past Medical History:  Diagnosis Date   Bipolar disorder (HCC)     Hypertension     Past Surgical History:  Procedure Laterality Date   Impacted tooth     Family History:  Family History  Problem Relation Age of Onset   Hypertension Mother    Hyperlipidemia Father    Hypertension Brother    Mental illness Paternal Grandmother    Family Psychiatric  History: see above Social History:  Social History   Substance and Sexual Activity  Alcohol Use Not Currently   Comment: quit drking in his 47s.      Social History   Substance and Sexual Activity  Drug Use Not Currently    Social History   Socioeconomic History   Marital status: Married    Spouse name: Not on file   Number of children: Not on file   Years of education: Not on file   Highest education level: Not on file  Occupational History   Not on file  Tobacco Use   Smoking status: Former    Packs/day: 0.75    Years: 30.00    Total pack years: 22.50    Types: Cigarettes    Start date: 05/31/1975   Smokeless tobacco: Never   Tobacco comments:    started smoking as a teenager average 1 ppd  Vaping Use   Vaping Use: Never used  Substance and Sexual Activity   Alcohol use: Not Currently    Comment: quit drking in his 51s.    Drug use: Not Currently   Sexual activity: Yes    Partners: Female  Birth control/protection: None  Other Topics Concern   Not on file  Social History Narrative   Not on file   Social Determinants of Health   Financial Resource Strain: Not on file  Food Insecurity: Not on file  Transportation Needs: Not on file  Physical Activity: Not on file  Stress: Not on file  Social Connections: Not on file   Additional Social History:    Allergies:  No Known Allergies  Labs:  Results for orders placed or performed during the hospital encounter of 04/03/22 (from the past 48 hour(s))  Comprehensive metabolic panel     Status: Abnormal   Collection Time: 04/03/22  9:57 AM  Result Value Ref Range   Sodium 131 (L) 135 - 145 mmol/L   Potassium 4.3 3.5 -  5.1 mmol/L   Chloride 100 98 - 111 mmol/L   CO2 25 22 - 32 mmol/L   Glucose, Bld 270 (H) 70 - 99 mg/dL    Comment: Glucose reference range applies only to samples taken after fasting for at least 8 hours.   BUN 18 8 - 23 mg/dL   Creatinine, Ser 1.611.02 0.61 - 1.24 mg/dL   Calcium 8.8 (L) 8.9 - 10.3 mg/dL   Total Protein 7.1 6.5 - 8.1 g/dL   Albumin 3.8 3.5 - 5.0 g/dL   AST 25 15 - 41 U/L   ALT 17 0 - 44 U/L   Alkaline Phosphatase 110 38 - 126 U/L   Total Bilirubin 0.6 0.3 - 1.2 mg/dL   GFR, Estimated >09>60 >60>60 mL/min    Comment: (NOTE) Calculated using the CKD-EPI Creatinine Equation (2021)    Anion gap 6 5 - 15    Comment: Performed at [facility], [address] [place], [place] [address]  CBC     Status: Abnormal   Collection Time: 04/03/22  9:57 AM  Result Value Ref Range   WBC 7.0 4.0 - 10.5 K/uL   RBC 4.04 (L) 4.22 - 5.81 MIL/uL   Hemoglobin 13.5 13.0 - 17.0 g/dL   HCT 81.139.8 91.439.0 - 78.252.0 %   MCV 98.5 80.0 - 100.0 fL   MCH 33.4 26.0 - 34.0 pg   MCHC 33.9 30.0 - 36.0 g/dL   RDW 95.612.7 21.311.5 - 08.615.5 %   Platelets 283 150 - 400 K/uL   nRBC 0.0 0.0 - 0.2 %    Comment: Performed at [facility], [address] [place], [place] [address]  Ethanol     Status: None   Collection Time: 04/03/22  9:57 AM  Result Value Ref Range   Alcohol, Ethyl (B) <10 <10 mg/dL    Comment: (NOTE) Lowest detectable limit for serum alcohol is 10 mg/dL.  For medical purposes only. Performed at [facility], [address] [place], [place] [address]   Valproic acid level     Status: Abnormal   Collection Time: 04/03/22  9:57 AM  Result Value Ref Range   Valproic Acid Lvl <10 (L) 50.0 - 100.0 ug/mL    Comment: Performed at [facility], [address] [place], [place] [address]  Osmolality     Status: None   Collection Time: 04/03/22  9:57 AM  Result Value Ref Range   Osmolality 285 275 - 295 mOsm/kg    Comment: Performed at [facility], [address] [place], [place] [address]  Urinalysis, Routine w reflex microscopic     Status: Abnormal   Collection Time: 04/03/22 11:09 AM  Result  Value Ref Range   Color, Urine STRAW (A) YELLOW   APPearance CLEAR (A) CLEAR   Specific Gravity, Urine 1.005 1.005 - 1.030   pH 6.0 5.0 - 8.0   Glucose, UA 150 (A) NEGATIVE mg/dL   Hgb urine dipstick SMALL (A) NEGATIVE   Bilirubin Urine NEGATIVE NEGATIVE   Ketones, ur NEGATIVE NEGATIVE mg/dL   Protein, ur NEGATIVE NEGATIVE mg/dL   Nitrite NEGATIVE NEGATIVE   Leukocytes,Ua NEGATIVE NEGATIVE   RBC / HPF 0-5 0 - 5 RBC/hpf   WBC, UA 0-5 0 - 5 WBC/hpf   Bacteria, UA RARE (A) NONE SEEN   Squamous Epithelial / LPF NONE SEEN 0 - 5   Mucus PRESENT     Comment: Performed at [facility], [address] [place], [place] [address]  Osmolality, urine     Status: Abnormal   Collection Time: 04/03/22 11:09 AM  Result Value Ref Range   Osmolality, Ur 233 (L) 300 - 900 mOsm/kg    Comment: Performed at [facility], [address] [place], [place] [address]  Sodium, urine, random     Status: None   Collection Time: 04/03/22 11:09 AM  Result Value Ref Range   Sodium, Ur 41 mmol/L    Comment: Performed at [facility], [address] [place], [place] [address]  Urine Drug Screen, Qualitative ([facility] only)     Status: None   Collection Time: 04/03/22 11:09 AM  Result Value Ref Range   Tricyclic, Ur Screen NONE DETECTED NONE DETECTED   Amphetamines, Ur Screen NONE DETECTED NONE DETECTED   MDMA (Ecstasy)Ur Screen NONE DETECTED NONE DETECTED   Cocaine Metabolite,Ur [place] NONE DETECTED NONE DETECTED   Opiate, Ur Screen NONE DETECTED NONE DETECTED   Phencyclidine (PCP) Ur S NONE DETECTED NONE DETECTED   Cannabinoid 50 Ng, Ur [place] NONE DETECTED NONE DETECTED   Barbiturates, Ur Screen NONE DETECTED NONE DETECTED   Benzodiazepine, Ur Scrn NONE DETECTED NONE DETECTED   Methadone Scn, Ur NONE DETECTED NONE DETECTED    Comment:  (NOTE) Tricyclics + metabolites, urine    Cutoff 1000 ng/mL Amphetamines + metabolites, urine  Cutoff 1000 ng/mL MDMA (Ecstasy), urine              Cutoff 500 ng/mL Cocaine Metabolite, urine          Cutoff 300 ng/mL Opiate + metabolites, urine        Cutoff 300 ng/mL Phencyclidine (PCP), urine         Cutoff 25 ng/mL Cannabinoid, urine                 Cutoff 50 ng/mL Barbiturates + metabolites, urine  Cutoff 200 ng/mL Benzodiazepine, urine              Cutoff 200 ng/mL Methadone, urine                   Cutoff 300 ng/mL  The urine drug screen provides only a preliminary, unconfirmed analytical test result and should not be used for non-medical purposes. Clinical consideration and professional judgment should be applied to any positive drug screen result due to possible interfering substances. A more specific alternate chemical method must be used in order to obtain a confirmed analytical result. Gas chromatography / mass spectrometry (GC/MS) is the preferred confirm atory method. Performed at [facility], [address] [place], [place] [address]   Procalcitonin - Baseline     Status: None   Collection Time: 04/03/22  3:46 PM  Result Value Ref Range   Procalcitonin <0.10 ng/mL    Comment:        Interpretation: PCT (Procalcitonin) <= 0.5 ng/mL: Systemic infection (sepsis) is not likely. Local bacterial infection is possible. (NOTE)       Sepsis PCT Algorithm           Lower Respiratory Tract                                      Infection PCT Algorithm    ----------------------------     ----------------------------         PCT < 0.25 ng/mL                PCT < 0.10 ng/mL          Strongly encourage             Strongly discourage   discontinuation of antibiotics    initiation of antibiotics    ----------------------------     -----------------------------       PCT 0.25 - 0.50 ng/mL            PCT 0.10 - 0.25 ng/mL               OR       >80% decrease in PCT             Discourage initiation of                                            antibiotics      Encourage discontinuation           of antibiotics    ----------------------------     -----------------------------         PCT >= 0.50 ng/mL              PCT 0.26 - 0.50 ng/mL               AND        <80% decrease in PCT             Encourage initiation of                                             antibiotics       Encourage continuation           of antibiotics    ----------------------------     -----------------------------        PCT >= 0.50 ng/mL                  PCT > 0.50 ng/mL               AND         increase in PCT                  Strongly encourage                                      initiation of antibiotics    Strongly encourage escalation  of antibiotics                                     -----------------------------                                           PCT <= 0.25 ng/mL                                                 OR                                        > 80% decrease in PCT                                      Discontinue / Do not initiate                                             antibiotics  Performed at [facility], [address] [place], [place] [address]   TSH     Status: None   Collection Time: 04/03/22  3:46 PM  Result Value Ref Range   TSH 0.842 0.350 - 4.500 uIU/mL    Comment: Performed by a 3rd Generation assay with a functional sensitivity of <=0.01 uIU/mL. Performed at [facility], [address] [place], [place] [address]   Valproic acid level     Status: Abnormal   Collection Time: 04/03/22  3:46 PM  Result Value Ref Range   Valproic Acid Lvl <10 (L) 50.0 - 100.0 ug/mL    Comment: RESULT CONFIRMED BY MANUAL DILUTION SKL Performed at [facility], [address] [place], [place] [address]   CBG monitoring, ED     Status: Abnormal   Collection Time: 04/03/22  4:32 PM  Result Value Ref  Range   Glucose-Capillary 144 (H) 70 - 99 mg/dL    Comment: Glucose reference range applies only to samples taken after fasting for at least 8 hours.    Current Facility-Administered Medications  Medication Dose Route Frequency Provider Last Rate Last Admin   acetaminophen (TYLENOL) tablet 650 mg  650 mg Oral Q6H PRN Cox, Amy N, DO       Or   acetaminophen (TYLENOL) suppository 650 mg  650 mg Rectal Q6H PRN Cox, Amy N, DO       [START ON 04/04/2022] amLODipine (NORVASC) tablet 10 mg  10 mg Oral Daily Cox, Amy N, DO       busPIRone (BUSPAR) tablet 10 mg  10 mg Oral BID Cox, Amy N, DO       divalproex (DEPAKOTE) DR tablet 500 mg  500 mg Oral BID Cox, Amy N, DO       enoxaparin (LOVENOX) injection 40 mg  40 mg Subcutaneous QHS Cox, Amy N, DO       hydrALAZINE (APRESOLINE) injection 5 mg  5 mg Intravenous Q6H PRN Cox, Amy N, DO       insulin aspart (novoLOG) injection 0-15 Units  0-15 Units Subcutaneous TID WC Cox, Amy N, DO       insulin aspart (novoLOG) injection 0-5 Units  0-5 Units Subcutaneous QHS Cox, Amy N, DO       [START ON 04/04/2022] irbesartan (AVAPRO) tablet 75 mg  75 mg Oral Daily Cox, Amy N, DO       nicotine (NICODERM CQ - dosed in mg/24 hours) patch 21 mg  21 mg Transdermal Daily PRN Cox, Amy N, DO       OLANZapine (ZYPREXA) tablet 5 mg  5 mg Oral QHS Cox, Amy N, DO       ondansetron (ZOFRAN) tablet 4 mg  4 mg Oral Q6H PRN Cox, Amy N, DO       Or   ondansetron (ZOFRAN) injection 4 mg  4 mg Intravenous Q6H PRN Cox, Amy N, DO       polyethylene glycol (MIRALAX / GLYCOLAX) packet 17 g  17 g Oral BID PRN Cox, Amy N, DO       senna-docusate (Senokot-S) tablet 1 tablet  1 tablet Oral QHS PRN Cox, Amy N, DO       Current Outpatient Medications  Medication Sig Dispense Refill   amLODipine (NORVASC) 10 MG tablet TAKE 1 TABLET BY MOUTH DAILY 30 tablet 12   busPIRone (BUSPAR) 10 MG tablet Take 10 mg by mouth 2 (two) times daily.     OLANZapine (ZYPREXA) 5 MG tablet Take 5 mg by mouth at  bedtime.     valsartan (DIOVAN) 80 MG tablet TAKE 1 TABLET BY MOUTH DAILY. 90 tablet 4   carbamide peroxide (DEBROX) 6.5 % OTIC solution Place 5 drops into both ears daily as needed. 15 mL 0   divalproex (DEPAKOTE) 500 MG DR tablet TAKE 1 TABLET BY MOUTH 2 TIMES DAILY. (Patient not taking: Reported on 04/03/2022) 60 tablet 0   neomycin-polymyxin-hydrocortisone (CORTISPORIN) OTIC solution Place 3 drops into the right ear 4 (four) times daily for 7 days. 10 mL 0   QUEtiapine (SEROQUEL) 100 MG tablet Take 1 tablet (100 mg total) by mouth at bedtime. (Patient not taking: Reported on 04/03/2022) 30 tablet 0   risperiDONE (RISPERDAL) 2 MG tablet TAKE 1 TABLET BY MOUTH 2 TIMES DAILY. (Patient not taking: Reported on 03/27/2022) 60 tablet 0    Musculoskeletal: Strength & Muscle Tone: within normal limits Gait & Station: normal Patient leans: N/A  Psychiatric Specialty Exam: Physical Exam Vitals and nursing note reviewed.  Constitutional:      Appearance: Normal appearance.  HENT:     Head: Normocephalic.     Nose: Nose normal.  Pulmonary:     Effort: Pulmonary effort is normal.  Musculoskeletal:        General: Normal range of motion.     Cervical back: Normal range of motion.  Neurological:     General: No focal deficit present.     Mental Status: He is alert.  Psychiatric:        Attention and Perception: Attention normal. He perceives auditory hallucinations.        Mood and Affect: Mood is anxious.        Speech: Speech normal.        Behavior: Behavior is hyperactive.        Thought Content: Thought content normal.        Cognition and Memory: Memory is impaired.  Judgment: Judgment normal.     Review of Systems  Psychiatric/Behavioral:  Positive for hallucinations and memory loss. The patient is nervous/anxious and has insomnia.   All other systems reviewed and are negative.   Blood pressure 124/70, pulse 82, temperature 97.6 F (36.4 C), temperature source Oral, resp.  rate 20, height 5\' 7"  (1.702 m), weight 61 kg, SpO2 95 %.Body mass index is 21.06 kg/m.  General Appearance: Casual  Eye Contact:  Fair  Speech:  Clear and Coherent  Volume:  Normal  Mood:  Anxious  Affect:  Congruent  Thought Process:  Coherent and Descriptions of Associations: Intact  Orientation:  Full (Time, Place, and Person)  Thought Content:  Hallucinations: Auditory  Suicidal Thoughts:  No  Homicidal Thoughts:  No  Memory:  Immediate;   Fair Recent;   Fair Remote;   Fair  Judgement:  Fair  Insight:  Fair  Psychomotor Activity:  Increased  Concentration:  Concentration: Fair and Attention Span: Fair  Recall:  of Knowledge:  Good  Language:  Good  Akathisia:  No  Handed:  Right  AIMS (if indicated):     Assets:  Housing Leisure Time Physical Health Resilience Social Support  ADL's:  Intact  Cognition:  WNL  Sleep:        Physical Exam: Physical Exam Vitals and nursing note reviewed.  Constitutional:      Appearance: Normal appearance.  HENT:     Head: Normocephalic.     Nose: Nose normal.  Pulmonary:     Effort: Pulmonary effort is normal.  Musculoskeletal:        General: Normal range of motion.     Cervical back: Normal range of motion.  Neurological:     General: No focal deficit present.     Mental Status: He is alert.  Psychiatric:        Attention and Perception: Attention normal. He perceives auditory hallucinations.        Mood and Affect: Mood is anxious.        Speech: Speech normal.        Behavior: Behavior is hyperactive.        Thought Content: Thought content normal.        Cognition and Memory: Memory is impaired.        Judgment: Judgment normal.    Review of Systems  Psychiatric/Behavioral:  Positive for hallucinations and memory loss. The patient is nervous/anxious and has insomnia.   All other systems reviewed and are negative.  Blood pressure 124/70, pulse 82, temperature 97.6 F (36.4 C), temperature source  Oral, resp. rate 20, height 5\' 7"  (1.702 m), weight 61 kg, SpO2 95 %. Body mass index is 21.06 kg/m.  Treatment Plan Summary: Bipolar disorder, mania, moderate Continue Depakote 500 mg BID Continue Zyprexa 5 mg at bedtime  Insomnia: Started Remeron 7.5 mg daily at bedtime PRN  Disposition: Recommend psychiatric Inpatient admission when medically cleared.  [employer], NP 04/03/2022 5:17 PM

## 2022-04-03 NOTE — Assessment & Plan Note (Addendum)
-   Low clinical suspicion or indication that patient's serum sodium level is slightly etiology for his altered mental status - Given my evaluation of patient, I suspect the patient's altered mental status and hallucinations secondary to self discontinuation of psychiatric medications without the benefit and guidance of his behavioral health specialist - AM team to transition primary care to behavioral health as appropriate versus discharge to facility versus home - Given his history of bipolar affective disorder, and reported not taking his home quetiapine and risperidone, this may be the etiology of his hallucinations/altered mental status - Given this, we would appreciate psychiatric/behavioral health consultation as patient is being admitted to the hospital - We will also check for TSH, B12, UDS, procalcitonin, for completeness however I do not suspect that patient has an infectious/medical etiology for his hallucination and/or confusion - TOC consulted for possible SNF/home health needs - In setting of acute hallucination and altered mental status, has not ordered PT, OT - A.m. team to order PT, OT as needed - Admit to MedSurg, observation

## 2022-04-03 NOTE — Assessment & Plan Note (Addendum)
-   Mild - Resumed regular diet - BMP in the a.m.

## 2022-04-03 NOTE — ED Notes (Signed)
Called lab again to ask about serum osmolality, lab confirms they have enough blood for test and will run it now.

## 2022-04-03 NOTE — Hospital Course (Addendum)
[name] is a 66 year old male with history of hypertension, tobacco use, history of hyponatremia, bipolar affective disorder, history of hallucinations, who presents emergency department for chief concerns of altered mental status and hallucinations.  Initial vitals in the emergency department showed temperature of 97.6, respiration rate of 19, heart rate of 109, blood pressure 121/77, SPO2 of 97% on room air.  Serum sodium is 131, potassium 4.3, chloride 100, bicarb 25, BUN of 18, serum creatinine 1.02, GFR greater than 60, nonfasting blood glucose 170, WBC 7.0, hemoglobin 13.5, platelets of 283.  EtOH level was negative, valproic acid level was negative, UA was negative for leukocytes and nitrates.  EDP consulted behavioral health service.  ED treatment: None

## 2022-04-03 NOTE — ED Notes (Signed)
Called lab to ensure that add on labs were received and enough sample in lab to run. Lab said they are good, no extra blood needed.

## 2022-04-03 NOTE — ED Provider Notes (Signed)
[facility] Provider Note    Event Date/Time   First MD Initiated Contact with Patient 04/03/22 1001     (approximate)   History   Abnormal Lab and Altered Mental Status   HPI  Eric A Boniface Goffe. is a 66 y.o. male here with abnormal lab.  The patient reportedly has had increasing, severe, hallucinations and paranoia for the last several weeks.  He has been seen by his outpatient psychiatrist to actually started him on olanzapine recently.  They also restarted Depakote.  Patient has a history of similar symptoms in the setting of hyponatremia in the past, though he has had fairly chronic hyponatremia.  According to discussion with the patient psychiatrist, patient has had no manic symptoms though he does have history of bipolar disorder.  However, his hallucinations seem possibly organic or related to delirium, and have been increasingly severe.  He reportedly was found wandering down the street yesterday by his wife.  He has been confused but not combative yet.  He seems to be getting worse.  Patient tells me that he drinks" a lot" of water, also has reportedly been on sodium tablets in the past.  To me, he does endorse hallucinations and states that he feels like he is seeing things, hearing things.  Denies any suicidal ideation or homicidal nation.  He has been taking his medications as prescribed.  Denies any urinary symptoms.  Denies any headache.  No focal numbness or weakness.     Physical Exam   Triage Vital Signs: ED Triage Vitals  Enc Vitals Group     BP 04/03/22 0958 116/82     Pulse Rate 04/03/22 0958 (!) 109     Resp 04/03/22 0958 16     Temp 04/03/22 0958 97.6 F (36.4 C)     Temp Source 04/03/22 0958 Oral     SpO2 04/03/22 0958 96 %     Weight 04/03/22 0955 134 lb 7.7 oz (61 kg)     Height 04/03/22 0955 5\' 7"  (1.702 m)     Head Circumference --      Peak Flow --      Pain Score 04/03/22 0955 0     Pain Loc --      Pain Edu? --      Excl.  in GC? --     Most recent vital signs: Vitals:   04/03/22 1405 04/03/22 1500  BP: 112/66 135/79  Pulse: 93 81  Resp: 12 19  Temp:    SpO2: 95% 97%     General: Awake, no distress.  CV:  Good peripheral perfusion.  Regular rate and rhythm.  No murmurs. Resp:  Normal effort.  Lungs clear to auscultation bilaterally. Abd:  No distention.  Other:  Alert, oriented to person and place but not time.  Strength out of 5 bilateral upper and lower extremities.  Cranials intact.  Endorses auditory and visual hallucinations.  Exhibits paranoia.   ED Results / Procedures / Treatments   Labs (all labs ordered are listed, but only abnormal results are displayed) Labs Reviewed  COMPREHENSIVE METABOLIC PANEL - Abnormal; Notable for the following components:      Result Value   Sodium 131 (*)    Glucose, Bld 270 (*)    Calcium 8.8 (*)    All other components within normal limits  CBC - Abnormal; Notable for the following components:   RBC 4.04 (*)    All other components within normal limits  URINALYSIS,  ROUTINE W REFLEX MICROSCOPIC - Abnormal; Notable for the following components:   Color, Urine STRAW (*)    APPearance CLEAR (*)    Glucose, UA 150 (*)    Hgb urine dipstick SMALL (*)    Bacteria, UA RARE (*)    All other components within normal limits  VALPROIC ACID LEVEL - Abnormal; Notable for the following components:   Valproic Acid Lvl <10 (*)    All other components within normal limits  OSMOLALITY, URINE - Abnormal; Notable for the following components:   Osmolality, Ur 233 (*)    All other components within normal limits  ETHANOL  OSMOLALITY  SODIUM, URINE, RANDOM  VITAMIN B12  PROCALCITONIN  TSH  VALPROIC ACID LEVEL  URINE DRUG SCREEN, QUALITATIVE ([facility] ONLY)     EKG Sinus tachycardia, ventricular rate 103.  PR 169, QRS 77, QTc 410.  No acute ST elevations or depressions.  No EKG evidence of acute ischemia or infarct.   RADIOLOGY Chest x-Kala: No active  disease CT head: No acute intracranial normality   I also independently reviewed and agree with radiologist interpretations.   PROCEDURES:  Critical Care performed: No  MEDICATIONS ORDERED IN ED: Medications  divalproex (DEPAKOTE) DR tablet 500 mg (has no administration in time range)  acetaminophen (TYLENOL) tablet 650 mg (has no administration in time range)    Or  acetaminophen (TYLENOL) suppository 650 mg (has no administration in time range)  ondansetron (ZOFRAN) tablet 4 mg (has no administration in time range)    Or  ondansetron (ZOFRAN) injection 4 mg (has no administration in time range)  enoxaparin (LOVENOX) injection 40 mg (has no administration in time range)  senna-docusate (Senokot-S) tablet 1 tablet (has no administration in time range)  polyethylene glycol (MIRALAX / GLYCOLAX) packet 17 g (has no administration in time range)  nicotine (NICODERM CQ - dosed in mg/24 hours) patch 21 mg (has no administration in time range)  insulin aspart (novoLOG) injection 0-15 Units (has no administration in time range)  insulin aspart (novoLOG) injection 0-5 Units (has no administration in time range)  hydrALAZINE (APRESOLINE) injection 5 mg (has no administration in time range)     IMPRESSION / MDM / ASSESSMENT AND PLAN / ED COURSE  I reviewed the triage vital signs and the nursing notes.                               The patient is on the cardiac monitor to evaluate for evidence of arrhythmia and/or significant heart rate changes.   Ddx:  Differential includes the following, with pertinent life- or limb-threatening emergencies considered:  Delirium, symptomatic hyponatremia, polypharmacy, primary psychosis/schizoaffective disorder, metabolic encephalopathy  Patient's presentation is most consistent with acute presentation with potential threat to life or bodily function.  MDM:  66 year old male here with status, hallucinations.  Unclear etiology.  Patient has a  history of chronic hyponatremia which could be contributing, though it does not appear significantly worse from his baseline.  Patient also has a significant underlying psychiatric history.  Lab work shows sodium 131, which is near baseline.  Serum osmolality pending.  Urinalysis negative for UTI.  CT head shows no acute intracranial malady.  Chest x-Davey clear.  Actually had a long discussion with the patient psychiatrist, who does not feel that he is manic.  Unclear whether this is primary psychiatric condition versus hyponatremia versus delirium.  Given possibility of hyponatremia, will consult hospitalist for evaluation and  possible admission.  Patient and family updated and are in agreement with this plan.   MEDICATIONS GIVEN IN ED: Medications  divalproex (DEPAKOTE) DR tablet 500 mg (has no administration in time range)  acetaminophen (TYLENOL) tablet 650 mg (has no administration in time range)    Or  acetaminophen (TYLENOL) suppository 650 mg (has no administration in time range)  ondansetron (ZOFRAN) tablet 4 mg (has no administration in time range)    Or  ondansetron (ZOFRAN) injection 4 mg (has no administration in time range)  enoxaparin (LOVENOX) injection 40 mg (has no administration in time range)  senna-docusate (Senokot-S) tablet 1 tablet (has no administration in time range)  polyethylene glycol (MIRALAX / GLYCOLAX) packet 17 g (has no administration in time range)  nicotine (NICODERM CQ - dosed in mg/24 hours) patch 21 mg (has no administration in time range)  insulin aspart (novoLOG) injection 0-15 Units (has no administration in time range)  insulin aspart (novoLOG) injection 0-5 Units (has no administration in time range)  hydrALAZINE (APRESOLINE) injection 5 mg (has no administration in time range)     Consults:  Hospitalist   EMR reviewed  Prior lab work, sodium levels, and had a long discussion with patient psychiatrist via telephone.     FINAL CLINICAL  IMPRESSION(S) / ED DIAGNOSES   Final diagnoses:  Hyponatremia  Delirium     Rx / DC Orders   ED Discharge Orders     None        Note:  This document was prepared using Dragon voice recognition software and may include unintentional dictation errors.   Shaune Pollack, MD 04/03/22 [phone]

## 2022-04-03 NOTE — ED Triage Notes (Signed)
Pt arrives via EMS from home with low sodium levels and AMS for a few days.

## 2022-04-03 NOTE — ED Notes (Signed)
Pt. Ans spouse provided multiple drinks, cups of ice and snacks, per request.

## 2022-04-04 DIAGNOSIS — F3110 Bipolar disorder, current episode manic without psychotic features, unspecified: Secondary | ICD-10-CM

## 2022-04-04 DIAGNOSIS — F3112 Bipolar disorder, current episode manic without psychotic features, moderate: Secondary | ICD-10-CM | POA: Diagnosis not present

## 2022-04-04 LAB — BASIC METABOLIC PANEL
Anion gap: 9 (ref 5–15)
BUN: 11 mg/dL (ref 8–23)
CO2: 24 mmol/L (ref 22–32)
Calcium: 9.1 mg/dL (ref 8.9–10.3)
Chloride: 100 mmol/L (ref 98–111)
Creatinine, Ser: 0.79 mg/dL (ref 0.61–1.24)
GFR, Estimated: >60 mL/min (ref >60)
Glucose, Bld: 151 mg/dL — ABNORMAL HIGH (ref 70–99)
Potassium: 3.9 mmol/L (ref 3.5–5.1)
Sodium: 133 mmol/L — ABNORMAL LOW (ref 135–145)

## 2022-04-04 LAB — HEMOGLOBIN A1C
Hgb A1c MFr Bld: 5.7 % — ABNORMAL HIGH (ref 4.8–5.6)
Mean Plasma Glucose: 116.89 mg/dL

## 2022-04-04 LAB — CBC
HCT: 41.1 % (ref 39.0–52.0)
Hemoglobin: 14.3 g/dL (ref 13.0–17.0)
MCH: 33.7 pg (ref 26.0–34.0)
MCHC: 34.8 g/dL (ref 30.0–36.0)
MCV: 96.9 fL (ref 80.0–100.0)
Platelets: 287 10*3/uL (ref 150–400)
RBC: 4.24 MIL/uL (ref 4.22–5.81)
RDW: 12.5 % (ref 11.5–15.5)
WBC: 8 10*3/uL (ref 4.0–10.5)
nRBC: 0 % (ref 0.0–0.2)

## 2022-04-04 LAB — GLUCOSE, CAPILLARY
Glucose-Capillary: 158 mg/dL — ABNORMAL HIGH (ref 70–99)
Glucose-Capillary: 137 mg/dL — ABNORMAL HIGH (ref 70–99)
Glucose-Capillary: 104 mg/dL — ABNORMAL HIGH (ref 70–99)
Glucose-Capillary: 104 mg/dL — ABNORMAL HIGH (ref 70–99)

## 2022-04-04 MED ORDER — MIRTAZAPINE 15 MG PO TABS
7.5000 mg | ORAL_TABLET | Freq: Every day | ORAL | Status: DC
  Administered 2022-04-04 – 2022-04-06 (×3): 7.5 mg via ORAL
  Filled 2022-04-04 (×3): qty 1

## 2022-04-04 NOTE — Progress Notes (Signed)
Patient and patient's wife refused the Depakote.  Patients wife stated that she talked to a [name] on the phone today and 1 day ago and stated for the patient not to take the Depakote as it is replaced by Olanzapine.  Nurse made a note in the [facility].

## 2022-04-04 NOTE — Progress Notes (Signed)
Shortly after shift change, the patients wife wanted to know if the patient can use his eye drops.  Nurse presents to the room and discovered the patients wife had home eyedrops, Amlodipine, Buspar, and Olanzapine at the bedside.  Nurse attempted to obtain the meds to report to the pharmacy, patients wife refused in a calm manner and acknowledged that she knew not to administer to the patient and that she would take them home in the morning.  Patients wife and patient was educated on the importance of not taking home medications at the hospital, both patient and patients wife verbalized an understanding.

## 2022-04-04 NOTE — Plan of Care (Signed)

## 2022-04-04 NOTE — Plan of Care (Signed)
  Problem: Coping: Goal: Ability to adjust to condition or change in health will improve Outcome: Progressing   Problem: Fluid Volume: Goal: Ability to maintain a balanced intake and output will improve Outcome: Progressing   Problem: Health Behavior/Discharge Planning: Goal: Ability to identify and utilize available resources and services will improve Outcome: Progressing Goal: Ability to manage health-related needs will improve Outcome: Progressing   Problem: Metabolic: Goal: Ability to maintain appropriate glucose levels will improve Outcome: Progressing   Problem: Nutritional: Goal: Maintenance of adequate nutrition will improve Outcome: Progressing Goal: Progress toward achieving an optimal weight will improve Outcome: Progressing   Problem: Skin Integrity: Goal: Risk for impaired skin integrity will decrease Outcome: Progressing   Problem: Tissue Perfusion: Goal: Adequacy of tissue perfusion will improve Outcome: Progressing   Problem: Education: Goal: Knowledge of General Education information will improve Description: Including pain rating scale, medication(s)/side effects and non-pharmacologic comfort measures Outcome: Progressing   Problem: Health Behavior/Discharge Planning: Goal: Ability to manage health-related needs will improve Outcome: Progressing   Problem: Clinical Measurements: Goal: Ability to maintain clinical measurements within normal limits will improve Outcome: Progressing Goal: Will remain free from infection Outcome: Progressing Goal: Diagnostic test results will improve Outcome: Progressing Goal: Respiratory complications will improve Outcome: Progressing Goal: Cardiovascular complication will be avoided Outcome: Progressing   Problem: Activity: Goal: Risk for activity intolerance will decrease Outcome: Progressing   Problem: Nutrition: Goal: Adequate nutrition will be maintained Outcome: Progressing   Problem: Coping: Goal:  Level of anxiety will decrease Outcome: Progressing   Problem: Elimination: Goal: Will not experience complications related to bowel motility Outcome: Progressing Goal: Will not experience complications related to urinary retention Outcome: Progressing   Problem: Pain Managment: Goal: General experience of comfort will improve Outcome: Progressing   Problem: Safety: Goal: Ability to remain free from injury will improve Outcome: Progressing   Problem: Skin Integrity: Goal: Risk for impaired skin integrity will decrease Outcome: Progressing   

## 2022-04-04 NOTE — Progress Notes (Signed)
[place] at [place] NAME: Eric Hatfield    MR#:  [id number]  DATE OF BIRTH:  February 19, 1956  SUBJECTIVE:   patient is bothered by the construction noise that is going around. Ambulating around the hallways. Met with family in the room   VITALS:  Blood pressure 115/80, pulse (!) 107, temperature 98.1 F (36.7 C), resp. rate 18, height _0  (1.702 m), weight 61 kg, SpO2 99 %.  PHYSICAL EXAMINATION:   GENERAL:  66 y.o.-year-old patient lying in the bed with no acute distress.  LUNGS: Normal breath sounds bilaterally, no wheezing, rales, rhonchi.  CARDIOVASCULAR: S1, S2 normal. No murmurs, rubs, or gallops.  ABDOMEN: Soft, nontender, nondistended. Bowel sounds present.  EXTREMITIES: No  edema b/l.    NEUROLOGIC: nonfocal  patient is alert and awake SKIN: No obvious rash, lesion, or ulcer.   LABORATORY PANEL:  CBC Recent Labs  Lab 04/04/22 0440  WBC 8.0  HGB 14.3  HCT 41.1  PLT 287    Chemistries  Recent Labs  Lab 04/03/22 0957 04/04/22 0440  NA 131* 133*  K 4.3 3.9  CL 100 100  CO2 25 24  GLUCOSE 270* 151*  BUN 18 11  CREATININE 1.02 0.79  CALCIUM 8.8* 9.1  AST 25  --   ALT 17  --   ALKPHOS 110  --   BILITOT 0.6  --    Cardiac Enzymes No results for input(s): "TROPONINI" in the last 168 hours. RADIOLOGY:  CT HEAD WO CONTRAST (5MM)  Result Date: 04/03/2022 CLINICAL DATA:  Mental status change, unknown cause EXAM: CT HEAD WITHOUT CONTRAST TECHNIQUE: Contiguous axial images were obtained from the base of the skull through the vertex without intravenous contrast. RADIATION DOSE REDUCTION: This exam was performed according to the departmental dose-optimization program which includes automated exposure control, adjustment of the mA and/or kV according to patient size and/or use of iterative reconstruction technique. COMPARISON:  None Available. FINDINGS: Brain: There is no acute intracranial hemorrhage, mass effect, or  edema. Gray-white differentiation is preserved. There is no extra-axial fluid collection. Ventricles and sulci are within normal limits in size and configuration. Patchy hypoattenuation in the supratentorial white matter is nonspecific but may reflect mild chronic microvascular ischemic changes. Vascular: There is atherosclerotic calcification at the skull base. Skull: Calvarium is unremarkable. Sinuses/Orbits: Patchy mucosal thickening.  Orbits are unremarkable. Other: None. IMPRESSION: No acute intracranial abnormality. Electronically Signed   By: Macy Mis M.D.   On: 04/03/2022 10:43   DG Chest 2 View  Result Date: 04/03/2022 CLINICAL DATA:  Weakness with altered mental status for a few days. EXAM: CHEST - 2 VIEW COMPARISON:  Chest CT 10/30/2021.  No prior radiographs available. FINDINGS: The heart size and mediastinal contours are normal. The lungs are clear. There is no pleural effusion or pneumothorax. No acute osseous findings are identified. Asymmetric glenohumeral degenerative changes on the left and mild spondylosis are noted. Telemetry leads overlie the chest. IMPRESSION: No evidence of active cardiopulmonary process. Electronically Signed   By: Richardean Sale M.D.   On: 04/03/2022 10:35    Assessment and Plan Eric Hatfield is a 66 year old male with history of hypertension, tobacco use, history of hyponatremia, bipolar affective disorder, history of hallucinations, who presents emergency department for chief concerns of altered mental status and hallucinations.  Bipolar affective disorder, current episode manic ([place]) - managed by Psychiatry   Altered mental status is likely due to his Psychiatric condition and non-compliance to meds -  Low clinical suspicion or indication that patient's serum sodium level is the etiology for his altered mental status - Sodium 133 -- d/w Psych NP Carmelina Paddock-- transfer down to [facility]. Currently no beds available.   Tobacco use disorder - As  needed nicotine patch    Essential hypertension - cont home meds    Hyponatremia - improved - Resumed regular diet  Family communication : wife at bedside Consults : psychiatry CODE STATUS: full DVT Prophylaxis : enoxaparin Level of care: Med-Surg Status is: Observation The patient remains OBS appropriate and will d/c before 2 midnights.   patient is medically stable. Transfer to [place] when bed open up for psychiatry recommendation.   TOTAL TIME TAKING CARE OF THIS PATIENT: 30 minutes.  >50% time spent on counselling and coordination of care  Note: This dictation was prepared with Dragon dictation along with smaller phrase technology. Any transcriptional errors that result from this process are unintentional.  Fritzi Mandes M.D    [facility]   CC: Primary care physician; Birdie Sons, MD

## 2022-04-05 DIAGNOSIS — F3112 Bipolar disorder, current episode manic without psychotic features, moderate: Secondary | ICD-10-CM | POA: Diagnosis not present

## 2022-04-05 LAB — GLUCOSE, CAPILLARY
Glucose-Capillary: 150 mg/dL — ABNORMAL HIGH (ref 70–99)
Glucose-Capillary: 203 mg/dL — ABNORMAL HIGH (ref 70–99)
Glucose-Capillary: 107 mg/dL — ABNORMAL HIGH (ref 70–99)

## 2022-04-05 NOTE — Progress Notes (Addendum)
[facility]  - [place] at [facility]   PATIENT NAME: Eric Hatfield    MR#:  [id number]  DATE OF BIRTH:  Apr 26, 1956  SUBJECTIVE:   patient is bothered by the construction noise that is going around. Ambulating around the hallways.  Wife tells me he slept well  VITALS:  Blood pressure 126/79, pulse (!) 101, temperature 97.8 F (36.6 C), resp. rate 16, height 5\' 7"  (1.702 m), weight 61 kg, SpO2 96 %.  PHYSICAL EXAMINATION:   GENERAL:  66 y.o.-year-old patient lying in the bed with no acute distress.  LUNGS: Normal breath sounds bilaterally CARDIOVASCULAR: S1, S2 normal. No murmurs ABDOMEN: Soft, nontender, nondistended. Bowel sounds present.  EXTREMITIES: No  edema b/l.    NEUROLOGIC: nonfocal  patient is alert and awake SKIN: No obvious rash, lesion, or ulcer.   LABORATORY PANEL:  CBC Recent Labs  Lab 04/04/22 0440  WBC 8.0  HGB 14.3  HCT 41.1  PLT 287     Chemistries  Recent Labs  Lab 04/03/22 0957 04/04/22 0440  NA 131* 133*  K 4.3 3.9  CL 100 100  CO2 25 24  GLUCOSE 270* 151*  BUN 18 11  CREATININE 1.02 0.79  CALCIUM 8.8* 9.1  AST 25  --   ALT 17  --   ALKPHOS 110  --   BILITOT 0.6  --     Cardiac Enzymes No results for input(s): "TROPONINI" in the last 168 hours. RADIOLOGY:  No results found.  Assessment and Plan Eric Hatfield is a 66 year old male with history of hypertension, tobacco use, history of hyponatremia, bipolar affective disorder, history of hallucinations, who presents emergency department for chief concerns of altered mental status and hallucinations.  Bipolar affective disorder, current episode manic ([facility]) - managed by Psychiatry   Altered mental status is likely due to his Psychiatric condition and non-compliance to meds - Low clinical suspicion or indication that patient's serum sodium level is the etiology for his altered mental status - Sodium 133 -- d/w Psych NP 76-- transfer down to [facility].  Currently no beds available.   Tobacco use disorder - As needed nicotine patch    Essential hypertension - cont home meds    Hyponatremia - improved - Resumed regular diet  Family communication : wife at bedside Consults : psychiatry CODE STATUS: full DVT Prophylaxis : enoxaparin Level of care: Med-Surg Status is: Observation The patient remains OBS appropriate and will d/c before 2 midnights.   patient is medically stable. Transfer to [facility] psych when bed open up per psychiatry recommendation.   TOTAL TIME TAKING CARE OF THIS PATIENT: 35 minutes.  >50% time spent on counselling and coordination of care  Note: This dictation was prepared with Dragon dictation along with smaller phrase technology. Any transcriptional errors that result from this process are unintentional.  [facility] M.D    [facility]   CC: Primary care physician; Enedina Finner, MD

## 2022-04-05 NOTE — Plan of Care (Signed)

## 2022-04-05 NOTE — Progress Notes (Signed)
Pt refused HS dose of Depakote.  Wife reports that medication was discontinued by [name].  Pt and wife informed that medication was restarted by Dr. Sedalia Muta on 6/14.  Despite education, pt and family continue to refuse medication and states that they will follow up with [name] on tomorrow morning.

## 2022-04-06 ENCOUNTER — Other Ambulatory Visit: Payer: Self-pay

## 2022-04-06 DIAGNOSIS — F3112 Bipolar disorder, current episode manic without psychotic features, moderate: Secondary | ICD-10-CM | POA: Diagnosis not present

## 2022-04-06 LAB — GLUCOSE, CAPILLARY
Glucose-Capillary: 147 mg/dL — ABNORMAL HIGH (ref 70–99)
Glucose-Capillary: 132 mg/dL — ABNORMAL HIGH (ref 70–99)

## 2022-04-06 NOTE — Progress Notes (Signed)
[facility]  - [place] at [facility]   PATIENT NAME: Eric Hatfield    MR#:  [id number]  DATE OF BIRTH:  1956/02/23  SUBJECTIVE:   Seen earlier today. Sitting in the recliner. Patient tells me his wife at home. Did not report any auditory hallucinations to me. Patient reports he does not want to take his Lovenox shots  VITALS:  Blood pressure 107/63, pulse 87, temperature 97.6 F (36.4 C), resp. rate 17, height 5\' 7"  (1.702 m), weight 61 kg, SpO2 96 %.  PHYSICAL EXAMINATION:   GENERAL:  66 y.o.-year-old patient lying in the bed with no acute distress.  LUNGS: Normal breath sounds bilaterally CARDIOVASCULAR: S1, S2 normal. No murmurs  NEUROLOGIC: nonfocal  patient is alert and awake   LABORATORY PANEL:  CBC Recent Labs  Lab 04/04/22 0440  WBC 8.0  HGB 14.3  HCT 41.1  PLT 287     Chemistries  Recent Labs  Lab 04/03/22 0957 04/04/22 0440  NA 131* 133*  K 4.3 3.9  CL 100 100  CO2 25 24  GLUCOSE 270* 151*  BUN 18 11  CREATININE 1.02 0.79  CALCIUM 8.8* 9.1  AST 25  --   ALT 17  --   ALKPHOS 110  --   BILITOT 0.6  --     Cardiac Enzymes No results for input(s): "TROPONINI" in the last 168 hours. RADIOLOGY:  No results found.  Assessment and Plan Mussa Aldava is a 66 year old male with history of hypertension, tobacco use, history of hyponatremia, bipolar affective disorder, history of hallucinations, who presents emergency department for chief concerns of altered mental status and hallucinations.  Bipolar affective disorder, current episode manic ([facility]) - managed by Psychiatry   Altered mental status is likely due to his Psychiatric condition and non-compliance to meds - Sodium 133 -- d/w Psych NP 76-- transfer down to [facility]. Currently no beds available. -- Discussed with Dr. [employer] to see patient and advise regarding further plan.   Tobacco use disorder - As needed nicotine patch    Essential hypertension - cont  home meds    Hyponatremia - improved - Resumed regular diet  Family communication : none today Consults : psychiatry CODE STATUS: full DVT Prophylaxis : enoxaparin Level of care: Med-Surg Status is: Observation The patient remains OBS appropriate and will d/c before 2 midnights.   patient is medically stable. Transfer to [facility] psych when bed open up per psychiatry recommendation.   TOTAL TIME TAKING CARE OF THIS PATIENT: 25 minutes.  >50% time spent on counselling and coordination of care  Note: This dictation was prepared with Dragon dictation along with smaller phrase technology. Any transcriptional errors that result from this process are unintentional.  [facility] M.D    [facility]   CC: Primary care physician; Enedina Finner, MD

## 2022-04-07 DIAGNOSIS — F3111 Bipolar disorder, current episode manic without psychotic features, mild: Secondary | ICD-10-CM

## 2022-04-07 MED ORDER — MIRTAZAPINE 7.5 MG PO TABS: 7.5000 mg | ORAL_TABLET | Freq: Every day | ORAL | 0 refills | Status: DC

## 2022-04-07 NOTE — Discharge Summary (Signed)
Physician Discharge Summary   Patient: Eric Hatfield. MRN: [id number] DOB: [birth year]  Admit date:     04/03/2022  Discharge date: 04/07/22  Discharge Physician: Enedina Finner   PCP: Malva Limes, MD   Recommendations at discharge:   F/u Dr Maryruth Bun in 1-2 weeks  Discharge Diagnoses: Principal Problem:   Bipolar affective disorder, current episode manic (HCC) Active Problems:   Hyponatremia   Smoking greater than 30 pack years   Essential hypertension   Tobacco use disorder   SIADH (syndrome of inappropriate ADH production) (HCC)   Altered mental status   Hospital Course:   Eric Hatfield is a 66 year old male with history of hypertension, tobacco use, history of hyponatremia, bipolar affective disorder, history of hallucinations, who presents emergency department for chief concerns of altered mental status and hallucinations.   Bipolar affective disorder, current episode manic (HCC) - managed by Psychiatry   Altered mental status is likely due to his Psychiatric condition and non-compliance to meds - Sodium 133 -- d/w Psych NP Colen Darling-- transfer down to [facility]. Currently no beds available. -- Discussed with Dr. Sherrye Payor, NP and ok to d/c home and cont present meds. Pt and wife agree and will f/u Dr Maryruth Bun 04/13/22 appt   Tobacco use disorder - As needed nicotine patch    Essential hypertension - cont home meds    Hyponatremia - improved - Resumed regular diet   Family communication : wife in the room Consults : psychiatry CODE STATUS: full DVT Prophylaxis : enoxaparin        Disposition: Home Diet recommendation:  Discharge Diet Orders (From admission, onward)     Start     Ordered   04/07/22 0000  Diet - low sodium heart healthy        04/07/22 1207           Regular diet DISCHARGE MEDICATION: Allergies as of 04/07/2022   No Known Allergies      Medication List     STOP taking these medications     neomycin-polymyxin-hydrocortisone OTIC solution Commonly known as: CORTISPORIN   QUEtiapine 100 MG tablet Commonly known as: SEROQUEL   risperiDONE 2 MG tablet Commonly known as: RISPERDAL       TAKE these medications    amLODipine 10 MG tablet Commonly known as: NORVASC TAKE 1 TABLET BY MOUTH DAILY   busPIRone 10 MG tablet Commonly known as: BUSPAR Take 10 mg by mouth 2 (two) times daily.   carbamide peroxide 6.5 % OTIC solution Commonly known as: DEBROX Place 5 drops into both ears daily as needed. Notes to patient: Not given in hospital   divalproex 500 MG DR tablet Commonly known as: DEPAKOTE TAKE 1 TABLET BY MOUTH 2 TIMES DAILY.   mirtazapine 7.5 MG tablet Commonly known as: REMERON Take 1 tablet (7.5 mg total) by mouth at bedtime.   OLANZapine 5 MG tablet Commonly known as: ZYPREXA Take 5 mg by mouth at bedtime.   valsartan 80 MG tablet Commonly known as: DIOVAN TAKE 1 TABLET BY MOUTH DAILY. Notes to patient: Not given in hospital        Follow-up Information     Fisher, Demetrios Isaacs, MD. Schedule an appointment as soon as possible for a visit in 1 week(s).   Specialty: Family Medicine Contact information: [address] [place] [address] [phone]         Darliss Ridgel, MD. Go to.   Specialty: Psychiatry Why: call to make  your appt   office is closed at this time Contact information: [address] [place] [address] [phone]         Alfredia Ferguson, PA-C .   Specialty: Physician Assistant Contact information: [address] [place] [address] [phone]                Discharge Exam: Ceasar Mons Weights   04/03/22 0955  Weight: 61 kg     Condition at discharge: fair  The results of significant diagnostics from this hospitalization (including imaging, microbiology, ancillary and laboratory) are listed below for reference.   Imaging Studies: CT HEAD WO CONTRAST  ( )  Result Date: 04/03/2022 CLINICAL DATA:  Mental status change, unknown cause EXAM: CT HEAD WITHOUT CONTRAST TECHNIQUE: Contiguous axial images were obtained from the base of the skull through the vertex without intravenous contrast. RADIATION DOSE REDUCTION: This exam was performed according to the departmental dose-optimization program which includes automated exposure control, adjustment of the mA and/or kV according to patient size and/or use of iterative reconstruction technique. COMPARISON:  None Available. FINDINGS: Brain: There is no acute intracranial hemorrhage, mass effect, or edema. Gray-white differentiation is preserved. There is no extra-axial fluid collection. Ventricles and sulci are within normal limits in size and configuration. Patchy hypoattenuation in the supratentorial white matter is nonspecific but may reflect mild chronic microvascular ischemic changes. Vascular: There is atherosclerotic calcification at the skull base. Skull: Calvarium is unremarkable. Sinuses/Orbits: Patchy mucosal thickening.  Orbits are unremarkable. Other: None. IMPRESSION: No acute intracranial abnormality. Electronically Signed   By: Guadlupe Spanish M.D.   On: 04/03/2022 10:43   DG Chest 2 View  Result Date: 04/03/2022 CLINICAL DATA:  Weakness with altered mental status for a few days. EXAM: CHEST - 2 VIEW COMPARISON:  Chest CT 10/30/2021.  No prior radiographs available. FINDINGS: The heart size and mediastinal contours are normal. The lungs are clear. There is no pleural effusion or pneumothorax. No acute osseous findings are identified. Asymmetric glenohumeral degenerative changes on the left and mild spondylosis are noted. Telemetry leads overlie the chest. IMPRESSION: No evidence of active cardiopulmonary process. Electronically Signed   By: Carey Bullocks M.D.   On: 04/03/2022 10:35    Microbiology: Results for orders placed or performed during the hospital encounter of 05/17/21  Resp Panel by  RT-PCR (Flu A&B, Covid) Nasopharyngeal Swab     Status: None   Collection Time: 05/17/21  3:58 PM   Specimen: Nasopharyngeal Swab; Nasopharyngeal(NP) swabs in vial transport medium  Result Value Ref Range Status   SARS Coronavirus 2 by RT PCR NEGATIVE NEGATIVE Final    Comment: (NOTE) SARS-CoV-2 target nucleic acids are NOT DETECTED.  The SARS-CoV-2 RNA is generally detectable in upper respiratory specimens during the acute phase of infection. The lowest concentration of SARS-CoV-2 viral copies this assay can detect is 138 copies/mL. A negative result does not preclude SARS-Cov-2 infection and should not be used as the sole basis for treatment or other patient management decisions. A negative result may occur with  improper specimen collection/handling, submission of specimen other than nasopharyngeal swab, presence of viral mutation(s) within the areas targeted by this assay, and inadequate number of viral copies(<138 copies/mL). A negative result must be combined with clinical observations, patient history, and epidemiological information. The expected result is Negative.  Fact Sheet for Patients:  BloggerCourse.com  Fact Sheet for Healthcare Providers:  SeriousBroker.it  This test is no t yet approved or cleared by the [place] and  has been authorized for detection and/or diagnosis of SARS-CoV-2 by FDA under an Emergency Use Authorization (EUA). This EUA will remain  in effect (meaning this test can be used) for the duration of the COVID-19 declaration under Section 564(b)(1) of the Act, 21 U.S.C.section 360bbb-3(b)(1), unless the authorization is terminated  or revoked sooner.       Influenza A by PCR NEGATIVE NEGATIVE Final   Influenza B by PCR NEGATIVE NEGATIVE Final    Comment: (NOTE) The Xpert Xpress SARS-CoV-2/FLU/RSV plus assay is intended as an aid in the diagnosis of influenza from Nasopharyngeal swab  specimens and should not be used as a sole basis for treatment. Nasal washings and aspirates are unacceptable for Xpert Xpress SARS-CoV-2/FLU/RSV testing.  Fact Sheet for Patients: BloggerCourse.com  Fact Sheet for Healthcare Providers: SeriousBroker.it  This test is not yet approved or cleared by the [place] FDA and has been authorized for detection and/or diagnosis of SARS-CoV-2 by FDA under an Emergency Use Authorization (EUA). This EUA will remain in effect (meaning this test can be used) for the duration of the COVID-19 declaration under Section 564(b)(1) of the Act, 21 U.S.C. section 360bbb-3(b)(1), unless the authorization is terminated or revoked.  Performed at [facility] [address] [place], [place] [address]     Labs: CBC: Recent Labs  Lab 04/03/22 0957 04/04/22 0440  WBC 7.0 8.0  HGB 13.5 14.3  HCT 39.8 41.1  MCV 98.5 96.9  PLT 283 287   Basic Metabolic Panel: Recent Labs  Lab 04/03/22 0957 04/04/22 0440  NA 131* 133*  K 4.3 3.9  CL 100 100  CO2 25 24  GLUCOSE 270* 151*  BUN 18 11  CREATININE 1.02 0.79  CALCIUM 8.8* 9.1   Liver Function Tests: Recent Labs  Lab 04/03/22 0957  AST 25  ALT 17  ALKPHOS 110  BILITOT 0.6  PROT 7.1  ALBUMIN 3.8   CBG: Recent Labs  Lab 04/05/22 0751 04/05/22 1546 04/05/22 2235 04/06/22 0819 04/06/22 1152  GLUCAP 150* 203* 107* 147* 132*    Discharge time spent: greater than 30 minutes.  Signed: Enedina Finner, MD [facility] 04/07/2022

## 2022-04-07 NOTE — Progress Notes (Signed)
Discharge instructions reviewed with patient and wife. I went over instructions especially medications to make sure they understood how to take them. They verbalized they understood the discharge instructions

## 2022-04-07 NOTE — Plan of Care (Signed)
Patient asleep most of the night. Aox4. Reports no pain or discomfort. No new changes in assessment. Call bell within reach. Wife at bedside.  PLAN OF CARE ONGOING  Problem: Education: Goal: Ability to describe self-care measures that may prevent or decrease complications (Diabetes Survival Skills Education) will improve Outcome: Progressing Goal: Individualized Educational Video(s) Outcome: Progressing   Problem: Coping: Goal: Ability to adjust to condition or change in health will improve Outcome: Progressing   Problem: Fluid Volume: Goal: Ability to maintain a balanced intake and output will improve Outcome: Progressing   Problem: Health Behavior/Discharge Planning: Goal: Ability to identify and utilize available resources and services will improve Outcome: Progressing Goal: Ability to manage health-related needs will improve Outcome: Progressing   Problem: Metabolic: Goal: Ability to maintain appropriate glucose levels will improve Outcome: Progressing   Problem: Nutritional: Goal: Maintenance of adequate nutrition will improve Outcome: Progressing Goal: Progress toward achieving an optimal weight will improve Outcome: Progressing   Problem: Skin Integrity: Goal: Risk for impaired skin integrity will decrease Outcome: Progressing   Problem: Tissue Perfusion: Goal: Adequacy of tissue perfusion will improve Outcome: Progressing   Problem: Education: Goal: Knowledge of General Education information will improve Description: Including pain rating scale, medication(s)/side effects and non-pharmacologic comfort measures Outcome: Progressing   Problem: Health Behavior/Discharge Planning: Goal: Ability to manage health-related needs will improve Outcome: Progressing   Problem: Clinical Measurements: Goal: Ability to maintain clinical measurements within normal limits will improve Outcome: Progressing Goal: Will remain free from infection Outcome: Progressing Goal:  Diagnostic test results will improve Outcome: Progressing Goal: Respiratory complications will improve Outcome: Progressing Goal: Cardiovascular complication will be avoided Outcome: Progressing   Problem: Activity: Goal: Risk for activity intolerance will decrease Outcome: Progressing   Problem: Nutrition: Goal: Adequate nutrition will be maintained Outcome: Progressing   Problem: Coping: Goal: Level of anxiety will decrease Outcome: Progressing   Problem: Elimination: Goal: Will not experience complications related to bowel motility Outcome: Progressing Goal: Will not experience complications related to urinary retention Outcome: Progressing   Problem: Pain Managment: Goal: General experience of comfort will improve Outcome: Progressing   Problem: Safety: Goal: Ability to remain free from injury will improve Outcome: Progressing   Problem: Skin Integrity: Goal: Risk for impaired skin integrity will decrease Outcome: Progressing

## 2022-04-07 NOTE — Consult Note (Cosign Needed Addendum)
[facility] Face-to-Face Psychiatry Consult   Reason for Consult:  f/up Referring Physician:  S. Allena Katz Patient Identification: Eric Hatfield. MRN:  [id number] Principal Diagnosis: Bipolar affective disorder, current episode manic (HCC) Diagnosis:  Principal Problem:   Bipolar affective disorder, current episode manic (HCC) Active Problems:   Hyponatremia   Smoking greater than 30 pack years   Essential hypertension   Tobacco use disorder   SIADH (syndrome of inappropriate ADH production) (HCC)   Altered mental status   Total Time spent with patient: 45 minutes  Subjective:   Eric Hatfield. is a 66 y.o. male patient admitted with AMS.  HPI:  Re-assessed this morning.  Patient was found in his room sitting on his bed, in street clothes.  He appears nervous, asking for his wife.  His wife had stepped out of the room for a walk.  Patient states that he has been sleeping really well since starting the Remeron.  He describes that before he came to the hospital he was not sleeping and had stopped the Depakote.  Patient states that he thinks he will do fine at home.  He asked several times about "hearing voices" and asks if this is okay.   When I asked patient to describe what he is hearing he tells me that he hears voices outside of the room.  Writer acknowledged that she heard voices as well that there were people talking in the hallway.  It may be that at times patient is hearing things that are audible to other people.  When wife reentered the room, the patient was visibly more calm.  Wife states that sometimes he will wake up at night and think that there were people outside that were not there.  She reiterates that this did start after he stopped the Depakote.  She agrees that he has been sleeping well since Remeron was initiated.  Patient denies any thoughts of self-harm or suicide.  Denies visual hallucinations or paranoia.  Patient states that he may hear some things sometimes but they are  not bothersome and he currently denies any auditory hallucinations.  Patient does not appear to be responding to internal stimuli.  He is making good eye contact and speaks fairly logically.  Long discussion with patient and his wife regarding continuing medications as directed upon discharge.  Patient and his wife agreed that they feel he is safe for psychiatric discharge.  Strict instructions to continue medications as directed and to call his outpatient provider, [name], and  ask if he needs to  see her sooner than his July 24 appointment, which wife and patient say that he has.  Patient was to be considered for geriatric psychiatric hospitalization, however there are no beds at this time, and patient is judged to be psychiatrically stable enough for discharge home at this time, with follow-up from his outside provider.  Patient will continue on current medications.    Past Psychiatric History: Bipolar affective disorder  Risk to Self:   Risk to Others:   Prior Inpatient Therapy:   Prior Outpatient Therapy:    Past Medical History:  Past Medical History:  Diagnosis Date   Bipolar disorder (HCC)    Hypertension     Past Surgical History:  Procedure Laterality Date   Impacted tooth     Family History:  Family History  Problem Relation Age of Onset   Hypertension Mother    Hyperlipidemia Father    Hypertension Brother    Mental illness Paternal  Grandmother    Family Psychiatric  History: Unknown Social History:  Social History   Substance and Sexual Activity  Alcohol Use Not Currently   Comment: quit drking in his 61s.      Social History   Substance and Sexual Activity  Drug Use Not Currently    Social History   Socioeconomic History   Marital status: Married    Spouse name: Braylon Grenda   Number of children: Not on file   Years of education: Not on file   Highest education level: Not on file  Occupational History   Not on file  Tobacco Use   Smoking  status: Former    Packs/day: 0.75    Years: 30.00    Total pack years: 22.50    Types: Cigarettes    Start date: 05/31/1975   Smokeless tobacco: Never   Tobacco comments:    started smoking as a teenager average 1 ppd  Vaping Use   Vaping Use: Never used  Substance and Sexual Activity   Alcohol use: Not Currently    Comment: quit drking in his 8s.    Drug use: Not Currently   Sexual activity: Yes    Partners: Female    Birth control/protection: None  Other Topics Concern   Not on file  Social History Narrative   Not on file   Social Determinants of Health   Financial Resource Strain: Not on file  Food Insecurity: Not on file  Transportation Needs: Not on file  Physical Activity: Not on file  Stress: Not on file  Social Connections: Not on file   Additional Social History:    Allergies:  No Known Allergies  Labs:  Results for orders placed or performed during the hospital encounter of 04/03/22 (from the past 48 hour(s))  Glucose, capillary     Status: Abnormal   Collection Time: 04/05/22  3:46 PM  Result Value Ref Range   Glucose-Capillary 203 (H) 70 - 99 mg/dL    Comment: Glucose reference range applies only to samples taken after fasting for at least 8 hours.  Glucose, capillary     Status: Abnormal   Collection Time: 04/05/22 10:35 PM  Result Value Ref Range   Glucose-Capillary 107 (H) 70 - 99 mg/dL    Comment: Glucose reference range applies only to samples taken after fasting for at least 8 hours.  Glucose, capillary     Status: Abnormal   Collection Time: 04/06/22  8:19 AM  Result Value Ref Range   Glucose-Capillary 147 (H) 70 - 99 mg/dL    Comment: Glucose reference range applies only to samples taken after fasting for at least 8 hours.  Glucose, capillary     Status: Abnormal   Collection Time: 04/06/22 11:52 AM  Result Value Ref Range   Glucose-Capillary 132 (H) 70 - 99 mg/dL    Comment: Glucose reference range applies only to samples taken after  fasting for at least 8 hours.    Current Facility-Administered Medications  Medication Dose Route Frequency Provider Last Rate Last Admin   acetaminophen (TYLENOL) tablet 650 mg  650 mg Oral Q6H PRN Cox, Amy N, DO   650 mg at 04/05/22 1201   Or   acetaminophen (TYLENOL) suppository 650 mg  650 mg Rectal Q6H PRN Cox, Amy N, DO       amLODipine (NORVASC) tablet 10 mg  10 mg Oral Daily Cox, Amy N, DO   10 mg at 04/07/22 0958   busPIRone (BUSPAR) tablet 10  mg  10 mg Oral BID Cox, Amy N, DO   10 mg at 04/07/22 2841   divalproex (DEPAKOTE) DR tablet 500 mg  500 mg Oral BID Cox, Amy N, DO   500 mg at 04/07/22 3244   irbesartan (AVAPRO) tablet 75 mg  75 mg Oral Daily Cox, Amy N, DO   75 mg at 04/07/22 0958   mirtazapine (REMERON) tablet 7.5 mg  7.5 mg Oral QHS Charm Rings, NP   7.5 mg at 04/06/22 2143   nicotine (NICODERM CQ - dosed in mg/24 hours) patch 21 mg  21 mg Transdermal Daily PRN Cox, Amy N, DO       OLANZapine (ZYPREXA) tablet 5 mg  5 mg Oral QHS Cox, Amy N, DO   5 mg at 04/06/22 2143   ondansetron (ZOFRAN) tablet 4 mg  4 mg Oral Q6H PRN Cox, Amy N, DO       Or   ondansetron (ZOFRAN) injection 4 mg  4 mg Intravenous Q6H PRN Cox, Amy N, DO       polyethylene glycol (MIRALAX / GLYCOLAX) packet 17 g  17 g Oral BID PRN Cox, Amy N, DO       senna-docusate (Senokot-S) tablet 1 tablet  1 tablet Oral QHS PRN Cox, Amy N, DO        Musculoskeletal: Strength & Muscle Tone: within normal limits Gait & Station: normal Patient leans: Right   Psychiatric Specialty Exam:  Presentation  General Appearance: Appropriate for Environment  Eye Contact:Good  Speech:Clear and Coherent Speech Volume:Normal Handedness:No data recorded  Mood and Affect  Mood:Anxious (wife describes he is at baseline) Affect:Congruent  Thought Process  Thought Processes:Coherent Descriptions of Associations:Intact  Orientation:Full (Time, Place and Person)  Thought Content:WDL  History of  Schizophrenia/Schizoaffective disorder:No  Duration of Psychotic Symptoms:Less than six months  Hallucinations:Hallucinations: None (denies currrently)  Ideas of Reference:None  Suicidal Thoughts:Suicidal Thoughts: No  Homicidal Thoughts:Homicidal Thoughts: No   Sensorium  Memory:Immediate Good Judgment:Good Insight:Fair  Executive Functions  Concentration:Fair Attention Span:Fair Recall:Fair Fund of Knowledge:Fair Language:Fair  Psychomotor Activity  Psychomotor Activity:Psychomotor Activity: Normal  Assets  Assets:Housing; Intimacy; Social Support; Resilience; Physical Health; Desire for Improvement; Financial Resources/Insurance  Sleep  Sleep:Sleep: Good  Physical Exam: Physical Exam Review of Systems  Constitutional: Negative.   HENT: Negative.    Eyes: Negative.   Respiratory: Negative.    Skin: Negative.   Psychiatric/Behavioral:  Positive for depression (stable). Negative for hallucinations, memory loss, substance abuse and suicidal ideas. The patient is nervous/anxious. The patient does not have insomnia.    Blood pressure 129/77, pulse 82, temperature 97.8 F (36.6 C), temperature source Oral, resp. rate 20, height 5\' 7"  (1.702 m), weight 61 kg, SpO2 96 %. Body mass index is 21.06 kg/m.  Treatment Plan Summary: Medication management: Continue Depakote 500 mg twice daily; Zyprexa 5 mg at bedtime; Remeron 7.5 mg daily at bedtime; BuSpar 10 mg twice daily  Disposition: No evidence of imminent risk to self or others at present.   Discussed crisis plan, support from social network, calling 911, coming to the Emergency Department, and calling Suicide Hotline. Reviewed with Dr. and Dr. Toni Amend.  Allena Katz, NP 04/07/2022 11:34 AM

## 2022-04-08 ENCOUNTER — Telehealth: Payer: Self-pay

## 2022-04-08 NOTE — Patient Outreach (Signed)
  Care Coordination TOC Note Transition Care Management Unsuccessful Follow-up Telephone Call  Date of discharge and from where:  [facility] 04/03/22-04/07/22  Attempts:  1st Attempt  Reason for unsuccessful TCM follow-up call:  Unable to leave message

## 2022-04-09 ENCOUNTER — Telehealth: Payer: Self-pay

## 2022-04-09 NOTE — Patient Outreach (Signed)
  Care Coordination TOC Note Transition Care Management Unsuccessful Follow-up Telephone Call  Date of discharge and from where:  [facility] 04/03/22-04/07/22  Attempts:  2nd Attempt  Reason for unsuccessful TCM follow-up call:  No answer/busy

## 2022-04-09 NOTE — Patient Outreach (Signed)
  Care Coordination TOC Note Transition Care Management Unsuccessful Follow-up Telephone Call  Date of discharge and from where:  [facility] 04/03/22-04/07/22  Attempts:  3rd Attempt  Reason for unsuccessful TCM follow-up call:  No answer/busy

## 2022-04-15 ENCOUNTER — Inpatient Hospital Stay: Payer: PPO | Admitting: Physician Assistant

## 2022-04-15 NOTE — Progress Notes (Deleted)
     I,Sha'taria Kimari Coudriet,acting as a Neurosurgeon for [employer], PA-C.,have documented all relevant documentation on the behalf of Alfredia Ferguson, PA-C,as directed by  Alfredia Ferguson, PA-C while in the presence of Alfredia Ferguson, PA-C.   Established patient visit   Patient: Eric Hatfield.   DOB: [birth year]   66 y.o. Male  MRN: [id number] Visit Date: 04/15/2022  Today's healthcare provider: Alfredia Ferguson, PA-C   No chief complaint on file.  Subjective    HPI  Follow up Hospitalization  Patient was admitted to [facility] on 04/03/22 and discharged on 04/07/22. He was treated for hyponatremia and delirium. Treatment for this included continue current treatment. Telephone follow up was done on n/a He reports {excellent/good/fair:19992} compliance with treatment. He reports this condition is {resolved/improved/worsened:23923}.  ----------------------------------------------------------------------------------------- -  Medications: Outpatient Medications Prior to Visit  Medication Sig   amLODipine (NORVASC) 10 MG tablet TAKE 1 TABLET BY MOUTH DAILY   busPIRone (BUSPAR) 10 MG tablet Take 10 mg by mouth 2 (two) times daily.   carbamide peroxide (DEBROX) 6.5 % OTIC solution Place 5 drops into both ears daily as needed.   divalproex (DEPAKOTE) 500 MG DR tablet TAKE 1 TABLET BY MOUTH 2 TIMES DAILY. (Patient not taking: Reported on 04/03/2022)   mirtazapine (REMERON) 7.5 MG tablet Take 1 tablet (7.5 mg total) by mouth at bedtime.   OLANZapine (ZYPREXA) 5 MG tablet Take 5 mg by mouth at bedtime.   valsartan (DIOVAN) 80 MG tablet TAKE 1 TABLET BY MOUTH DAILY.   No facility-administered medications prior to visit.    Review of Systems  {Labs  Heme  Chem  Endocrine  Serology  Results Review (optional):23779}   Objective    There were no vitals taken for this visit. {Show previous vital signs (optional):23777}  Physical Exam  ***  No results found for any visits on 04/15/22.   Assessment & Plan     ***  No follow-ups on file.      {provider attestation***:1}   Alfredia Ferguson, PA-C  [facility] [phone] (phone) [phone] (fax)  [facility]

## 2022-04-23 ENCOUNTER — Other Ambulatory Visit: Payer: Self-pay

## 2022-04-23 ENCOUNTER — Emergency Department
Admission: EM | Admit: 2022-04-23 | Discharge: 2022-04-23 | Disposition: A | Discharge status: Other Acute Inpatient Hospital | Payer: PPO | Attending: Emergency Medicine | Admitting: Emergency Medicine

## 2022-04-23 ENCOUNTER — Encounter: Payer: Self-pay | Admitting: Intensive Care

## 2022-04-23 ENCOUNTER — Inpatient Hospital Stay
Admission: AD | Admit: 2022-04-23 | Discharge: 2022-04-27 | DRG: 885 | Disposition: A | Discharge status: Home / Self Care | Payer: PPO | Source: Ambulatory Visit | Attending: Psychiatry | Admitting: Psychiatry

## 2022-04-23 DIAGNOSIS — Z79899 Other long term (current) drug therapy: Secondary | ICD-10-CM | POA: Diagnosis not present

## 2022-04-23 DIAGNOSIS — Z8249 Family history of ischemic heart disease and other diseases of the circulatory system: Secondary | ICD-10-CM | POA: Diagnosis not present

## 2022-04-23 DIAGNOSIS — F3164 Bipolar disorder, current episode mixed, severe, with psychotic features: Secondary | ICD-10-CM | POA: Diagnosis present

## 2022-04-23 DIAGNOSIS — Z83438 Family history of other disorder of lipoprotein metabolism and other lipidemia: Secondary | ICD-10-CM

## 2022-04-23 DIAGNOSIS — R443 Hallucinations, unspecified: Secondary | ICD-10-CM

## 2022-04-23 DIAGNOSIS — Y900 Blood alcohol level of less than 20 mg/100 ml: Secondary | ICD-10-CM | POA: Insufficient documentation

## 2022-04-23 DIAGNOSIS — I10 Essential (primary) hypertension: Secondary | ICD-10-CM | POA: Diagnosis present

## 2022-04-23 DIAGNOSIS — F419 Anxiety disorder, unspecified: Secondary | ICD-10-CM | POA: Diagnosis present

## 2022-04-23 DIAGNOSIS — R440 Auditory hallucinations: Secondary | ICD-10-CM | POA: Diagnosis present

## 2022-04-23 DIAGNOSIS — F3110 Bipolar disorder, current episode manic without psychotic features, unspecified: Secondary | ICD-10-CM | POA: Diagnosis not present

## 2022-04-23 DIAGNOSIS — Z20822 Contact with and (suspected) exposure to covid-19: Secondary | ICD-10-CM | POA: Insufficient documentation

## 2022-04-23 DIAGNOSIS — F17210 Nicotine dependence, cigarettes, uncomplicated: Secondary | ICD-10-CM | POA: Diagnosis present

## 2022-04-23 DIAGNOSIS — F319 Bipolar disorder, unspecified: Secondary | ICD-10-CM | POA: Diagnosis present

## 2022-04-23 LAB — COMPREHENSIVE METABOLIC PANEL
ALT: 13 U/L (ref 0–44)
AST: 17 U/L (ref 15–41)
Albumin: 4 g/dL (ref 3.5–5.0)
Alkaline Phosphatase: 116 U/L (ref 38–126)
Anion gap: 9 (ref 5–15)
BUN: 20 mg/dL (ref 8–23)
CO2: 24 mmol/L (ref 22–32)
Calcium: 9.3 mg/dL (ref 8.9–10.3)
Chloride: 99 mmol/L (ref 98–111)
Creatinine, Ser: 1.24 mg/dL (ref 0.61–1.24)
GFR, Estimated: >60 mL/min (ref >60)
Glucose, Bld: 119 mg/dL — ABNORMAL HIGH (ref 70–99)
Potassium: 4.5 mmol/L (ref 3.5–5.1)
Sodium: 132 mmol/L — ABNORMAL LOW (ref 135–145)
Total Bilirubin: 0.6 mg/dL (ref 0.3–1.2)
Total Protein: 7.4 g/dL (ref 6.5–8.1)

## 2022-04-23 LAB — CBC
HCT: 40 % (ref 39.0–52.0)
Hemoglobin: 13.6 g/dL (ref 13.0–17.0)
MCH: 33.6 pg (ref 26.0–34.0)
MCHC: 34 g/dL (ref 30.0–36.0)
MCV: 98.8 fL (ref 80.0–100.0)
Platelets: 271 10*3/uL (ref 150–400)
RBC: 4.05 MIL/uL — ABNORMAL LOW (ref 4.22–5.81)
RDW: 13.2 % (ref 11.5–15.5)
WBC: 8.5 10*3/uL (ref 4.0–10.5)
nRBC: 0 % (ref 0.0–0.2)

## 2022-04-23 LAB — URINE DRUG SCREEN, QUALITATIVE (ARMC ONLY)
Amphetamines, Ur Screen: NOT DETECTED
Barbiturates, Ur Screen: NOT DETECTED
Benzodiazepine, Ur Scrn: NOT DETECTED
Cannabinoid 50 Ng, Ur ~~LOC~~: NOT DETECTED
Cocaine Metabolite,Ur ~~LOC~~: NOT DETECTED
MDMA (Ecstasy)Ur Screen: NOT DETECTED
Methadone Scn, Ur: NOT DETECTED
Opiate, Ur Screen: NOT DETECTED
Phencyclidine (PCP) Ur S: NOT DETECTED
Tricyclic, Ur Screen: NOT DETECTED

## 2022-04-23 LAB — ETHANOL: Alcohol, Ethyl (B): <10 mg/dL (ref <10)

## 2022-04-23 LAB — RESP PANEL BY RT-PCR (FLU A&B, COVID) ARPGX2
Influenza A by PCR: NEGATIVE
Influenza B by PCR: NEGATIVE
SARS Coronavirus 2 by RT PCR: NEGATIVE

## 2022-04-23 LAB — ACETAMINOPHEN LEVEL: Acetaminophen (Tylenol), Serum: <10 ug/mL — ABNORMAL LOW (ref 10–30)

## 2022-04-23 LAB — SALICYLATE LEVEL: Salicylate Lvl: <7 mg/dL — ABNORMAL LOW (ref 7.0–30.0)

## 2022-04-23 MED ORDER — ALUM & MAG HYDROXIDE-SIMETH 200-200-20 MG/5ML PO SUSP: 30.0000 mL | ORAL | Status: DC | PRN

## 2022-04-23 MED ORDER — BUSPIRONE HCL 5 MG PO TABS
10.0000 mg | ORAL_TABLET | Freq: Two times a day (BID) | ORAL | Status: DC
  Administered 2022-04-23 – 2022-04-24 (×2): 10 mg via ORAL
  Filled 2022-04-23 (×2): qty 2

## 2022-04-23 MED ORDER — RISPERIDONE 1 MG PO TABS
1.0000 mg | ORAL_TABLET | Freq: Two times a day (BID) | ORAL | Status: DC
  Administered 2022-04-23: 2 mg via ORAL
  Administered 2022-04-24: 1 mg via ORAL
  Filled 2022-04-23: qty 1
  Filled 2022-04-23: qty 2

## 2022-04-23 MED ORDER — AMLODIPINE BESYLATE 5 MG PO TABS
10.0000 mg | ORAL_TABLET | Freq: Every day | ORAL | Status: DC
  Administered 2022-04-24 – 2022-04-27 (×4): 10 mg via ORAL
  Filled 2022-04-23 (×4): qty 2

## 2022-04-23 MED ORDER — MIRTAZAPINE 15 MG PO TABS
7.5000 mg | ORAL_TABLET | Freq: Every day | ORAL | Status: DC
  Administered 2022-04-23: 7.5 mg via ORAL
  Filled 2022-04-23: qty 1

## 2022-04-23 MED ORDER — DIVALPROEX SODIUM 500 MG PO DR TAB
500.0000 mg | DELAYED_RELEASE_TABLET | Freq: Two times a day (BID) | ORAL | Status: DC
  Administered 2022-04-23 – 2022-04-27 (×8): 500 mg via ORAL
  Filled 2022-04-23 (×8): qty 1

## 2022-04-23 MED ORDER — AMLODIPINE BESYLATE 5 MG PO TABS: 10.0000 mg | ORAL_TABLET | Freq: Every day | ORAL | Status: DC

## 2022-04-23 MED ORDER — RISPERIDONE 1 MG PO TABS: 1.0000 mg | ORAL_TABLET | Freq: Two times a day (BID) | ORAL | Status: DC

## 2022-04-23 MED ORDER — ACETAMINOPHEN 325 MG PO TABS: 650.0000 mg | ORAL_TABLET | Freq: Four times a day (QID) | ORAL | Status: DC | PRN

## 2022-04-23 MED ORDER — DIVALPROEX SODIUM 500 MG PO DR TAB: 500.0000 mg | DELAYED_RELEASE_TABLET | Freq: Two times a day (BID) | ORAL | Status: DC

## 2022-04-23 MED ORDER — MAGNESIUM HYDROXIDE 400 MG/5ML PO SUSP: 30.0000 mL | Freq: Every day | ORAL | Status: DC | PRN

## 2022-04-23 MED ORDER — MIRTAZAPINE 15 MG PO TABS: 7.5000 mg | ORAL_TABLET | Freq: Every day | ORAL | Status: DC

## 2022-04-23 MED ORDER — BUSPIRONE HCL 5 MG PO TABS: 10.0000 mg | ORAL_TABLET | Freq: Two times a day (BID) | ORAL | Status: DC
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
## 2022-04-23 NOTE — ED Notes (Signed)
Pt given dinner tray and ice water at this time.

## 2022-04-23 NOTE — ED Notes (Signed)
VOl NP recommends inpt psych

## 2022-04-23 NOTE — ED Notes (Addendum)
Wife took all of pt's belongings home per [facility], RN

## 2022-04-23 NOTE — ED Notes (Signed)
Vol pending consult 
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
## 2022-04-23 NOTE — ED Provider Notes (Signed)
[facility] Provider Note    Event Date/Time   First MD Initiated Contact with Patient 04/23/22 1351     (approximate)   History   Hallucinations   HPI  Eric A Vedh Ptacek. is a 66 y.o. male who comes in with concerns for auditory and visual hallucinations denies any SI or HI.  To me patient denies really visual hallucinations but more just hearing auditory things.  He was seen by psychiatry and they told him to come into the ER to have blood work done.  He denies any SI or HI any falls or hitting his head or any other concerns.  On review of records patient does have bipolar and SIADH and was admitted for hyponatremia from 7/14 until 7/18 and was originally going to be transferred down to [facility] but then ended up being discharged back home.   Physical Exam   Triage Vital Signs: ED Triage Vitals  Enc Vitals Group     BP 04/23/22 1330 (!) 99/58     Pulse Rate 04/23/22 1330 97     Resp 04/23/22 1330 18     Temp 04/23/22 1330 (!) 97.5 F (36.4 C)     Temp Source 04/23/22 1330 Oral     SpO2 04/23/22 1330 96 %     Weight 04/23/22 1334 125 lb (56.7 kg)     Height 04/23/22 1334 5\' 7"  (1.702 m)     Head Circumference --      Peak Flow --      Pain Score 04/23/22 1334 0     Pain Loc --      Pain Edu? --      Excl. in GC? --     Most recent vital signs: Vitals:   04/23/22 1330  BP: (!) 99/58  Pulse: 97  Resp: 18  Temp: (!) 97.5 F (36.4 C)  SpO2: 96%     General: Awake, no distress.  CV:  Good peripheral perfusion.  Resp:  Normal effort.  Abd:  No distention.  Other:  Patient is up ambulating around the room.  Denies any SI or HI.   ED Results / Procedures / Treatments   Labs (all labs ordered are listed, but only abnormal results are displayed) Labs Reviewed  CBC - Abnormal; Notable for the following components:      Result Value   RBC 4.05 (*)    All other components within normal limits  COMPREHENSIVE METABOLIC PANEL   ETHANOL  SALICYLATE LEVEL  ACETAMINOPHEN LEVEL  URINE DRUG SCREEN, QUALITATIVE ([facility] ONLY)    PROCEDURES:  Critical Care performed: No  Procedures   MEDICATIONS ORDERED IN ED: Medications - No data to display   IMPRESSION / MDM / ASSESSMENT AND PLAN / ED COURSE  I reviewed the triage vital signs and the nursing notes.   Patient's presentation is most consistent with acute presentation with potential threat to life or bodily function.   Differential includes hyponatremia given prior history of this.  CBC reassuring.  Sodium is 132.  EtOH negative.  I do not feel that this degree of hyponatremia is causing his hallucinations therefore I will discuss with the psychiatric team  Pt is without any acute medical complaints. No exam findings to suggest medical cause of current presentation. Will order psychiatric screening labs and discuss further w/ psychiatric service.  D/d includes but is not limited to psychiatric disease, behavioral/personality disorder, inadequate socioeconomic support, medical.  Based on HPI, exam, unremarkable labs, no  concern for acute medical problem at this time. No rigidity, clonus, hyperthermia, focal neurologic deficit, diaphoresis, tachycardia, meningismus, ataxia, gait abnormality or other finding to suggest this visit represents a non-psychiatric problem. Screening labs reviewed.    Given this, pt medically cleared, to be dispositioned per Psych.    The patient has been placed in psychiatric observation due to the need to provide a safe environment for the patient while obtaining psychiatric consultation and evaluation, as well as ongoing medical and medication management to treat the patient's condition.  The patient has not been placed under full IVC at this time.      FINAL CLINICAL IMPRESSION(S) / ED DIAGNOSES   Final diagnoses:  Hallucinations     Rx / DC Orders   ED Discharge Orders     None        Note:  This document was  prepared using Dragon voice recognition software and may include unintentional dictation errors.   Concha Se, MD 04/23/22 [phone]

## 2022-04-23 NOTE — ED Triage Notes (Signed)
Patient reports auditory and visual hallucinations. Wife reports patient sees a psychiatrist and last spoke with them Friday who recommended coming to ER and having blood work drawn. Wife reports he is paranoid someone is going to come in the house or tear it down and tells her not to run the water. Patient denies SI/HI at this time but states "I think someone is after me"

## 2022-04-23 NOTE — ED Notes (Signed)
Wife allowed to visit with pt, given update on POC

## 2022-04-24 ENCOUNTER — Inpatient Hospital Stay: Payer: PPO

## 2022-04-24 ENCOUNTER — Encounter: Payer: Self-pay | Admitting: Psychiatry

## 2022-04-24 DIAGNOSIS — F3164 Bipolar disorder, current episode mixed, severe, with psychotic features: Principal | ICD-10-CM

## 2022-04-24 LAB — LIPID PANEL
Cholesterol: 161 mg/dL (ref 0–200)
HDL: 59 mg/dL (ref >40)
LDL Cholesterol: 92 mg/dL (ref 0–99)
Total CHOL/HDL Ratio: 2.7 RATIO
Triglycerides: 52 mg/dL (ref <150)
VLDL: 10 mg/dL (ref 0–40)

## 2022-04-24 LAB — VITAMIN B12: Vitamin B-12: 693 pg/mL (ref 180–914)

## 2022-04-24 LAB — VALPROIC ACID LEVEL: Valproic Acid Lvl: 76 ug/mL (ref 50.0–100.0)

## 2022-04-24 MED ORDER — RISPERIDONE 1 MG PO TABS
2.0000 mg | ORAL_TABLET | Freq: Two times a day (BID) | ORAL | Status: DC
  Administered 2022-04-24 – 2022-04-26 (×4): 2 mg via ORAL
  Filled 2022-04-24 (×4): qty 2

## 2022-04-24 NOTE — Group Note (Signed)
[facility] LCSW Group Therapy Note   Group Date: 04/24/2022 Start Time: 1300 End Time: 1400  Type of Therapy and Topic:  Group Therapy:  Feelings around Relapse and Recovery  Participation Level:  Did Not Attend    Description of Group:    Patients in this group will discuss emotions they experience before and after a relapse. They will process how experiencing these feelings, or avoidance of experiencing them, relates to having a relapse. Facilitator will guide patients to explore emotions they have related to recovery. Patients will be encouraged to process which emotions are more powerful. They will be guided to discuss the emotional reaction significant others in their lives may have to patients' relapse or recovery. Patients will be assisted in exploring ways to respond to the emotions of others without this contributing to a relapse.  Therapeutic Goals: Patient will identify two or more emotions that lead to relapse for them:  Patient will identify two emotions that result when they relapse:  Patient will identify two emotions related to recovery:  Patient will demonstrate ability to communicate their needs through discussion and/or role plays.   Summary of Patient Progress: Pt declined to attend despite CSW invitation.   Therapeutic Modalities:   Cognitive Behavioral Therapy Solution-Focused Therapy Assertiveness Training Relapse Prevention Therapy   Eric Angell R Kambrie Eddleman, LCSW 

## 2022-04-24 NOTE — Plan of Care (Signed)
D: Pt alert and oriented. Pt rates depression 5/10, hopelessness 5/10, and anxiety 5/10. Pt reports energy level as normal and concentration as being good. Pt reports sleep last night as being fair. Pt did receive medications for sleep and did find them helpful. Pt denies experiencing any pain at this time. Pt denies experiencing any SI/HI, or AVH at this time.   A: Scheduled medications administered to pt, per MD orders. Support and encouragement provided. Frequent verbal contact made. Routine safety checks conducted q15 minutes.   R: No adverse drug reactions noted. Pt verbally contracts for safety at this time. Pt compliant with medications and treatment plan. Pt interacts well with others on the unit. Pt remains safe at this time. Will continue to monitor.   Problem: Education: Goal: Knowledge of General Education information will improve Description: Including pain rating scale, medication(s)/side effects and non-pharmacologic comfort measures Outcome: Progressing   Problem: Nutrition: Goal: Adequate nutrition will be maintained Outcome: Progressing

## 2022-04-24 NOTE — Progress Notes (Signed)
Recreation Therapy Notes  INPATIENT RECREATION TR PLAN  Patient Details Name: Eric Hatfield. MRN: [id number] DOB: 09/16/1956 Today's Date: 04/24/2022  Rec Therapy Plan Is patient appropriate for Therapeutic Recreation?: Yes Treatment times per week: at least 3 Estimated Length of Stay: 5-7 days TR Treatment/Interventions: Group participation (Comment)  Discharge Criteria Pt will be discharged from therapy if:: Discharged Treatment plan/goals/alternatives discussed and agreed upon by:: Patient/family  Discharge Summary     Peyton Spengler 04/24/2022, 2:42 PM

## 2022-04-24 NOTE — Progress Notes (Signed)
Recreation Therapy Notes  INPATIENT RECREATION THERAPY ASSESSMENT  Patient Details Name: Eric Hatfield. MRN: [id number] DOB: [birth year] Today's Date: 04/24/2022       Information Obtained From: Patient  Able to Participate in Assessment/Interview: Yes  Patient Presentation: Responsive  Reason for Admission (Per Patient): Active Symptoms  Patient Stressors:    Coping Skills:   Isolation, Deep Breathing, Other (Comment) (Smoke)  Leisure Interests (2+):  Social - Family, Exercise - Walking, Individual - TV  Frequency of Recreation/Participation: Monthly  Awareness of Community Resources:  Yes  Community Resources:  Park  Current Use: Yes  If no, Barriers?:    Expressed Interest in [employer] Information: Yes  [place]idence[place]  Patient Main Form of Transportation: Set designer  Patient Strengths:  N/A  Patient Identified Areas of Improvement:  Be more friendly  Patient Goal for Hospitalization:  Get out of here fast as I can  Current SI (including self-harm):  No  Current HI:  No  Current AVH: No  Staff Intervention Plan: Group Attendance, Collaborate with Interdisciplinary Treatment Team  Consent to Intern Participation: N/A  Karnisha Lefebre 04/24/2022, 2:40 PM

## 2022-04-24 NOTE — [facility] Suicide Risk Assessment (Signed)
[facility] Admission Suicide Risk Assessment   Nursing information obtained from:    Demographic factors:  Male Current Mental Status:  NA Loss Factors:  NA Historical Factors:  NA Risk Reduction Factors:  NA  Total Time spent with patient: 1 hour Principal Problem: Bipolar disorder, curr episode mixed, severe, with psychotic features (HCC) Diagnosis:  Principal Problem:   Bipolar disorder, curr episode mixed, severe, with psychotic features (HCC) Active Problems:   Essential hypertension   Bipolar affective disorder (HCC)  Subjective Data: Patient seen and chart reviewed.  Patient with bipolar disorder has been in what seems like a mixed episode with hallucinations and delusions.  No recent self-harm.  Denies any suicidal or homicidal ideation.  Does not appear fearful and anxious and feels that others are trying to harm him.  Continued Clinical Symptoms:    The "Alcohol Use Disorders Identification Test", Guidelines for Use in Primary Care, Second Edition.  World Science writer [facility]). Score between 0-7:  no or low risk or alcohol related problems. Score between 8-15:  moderate risk of alcohol related problems. Score between 16-19:  high risk of alcohol related problems. Score 20 or above:  warrants further diagnostic evaluation for alcohol dependence and treatment.   CLINICAL FACTORS:   Bipolar Disorder:   Mixed State   Musculoskeletal: Strength & Muscle Tone: within normal limits Gait & Station: normal Patient leans: N/A  Psychiatric Specialty Exam:  Presentation  General Appearance: Appropriate for Environment  Eye Contact:Good  Speech:Clear and Coherent  Speech Volume:Normal  Handedness:No data recorded  Mood and Affect  Mood:Anxious  Affect:Congruent   Thought Process  Thought Processes:Disorganized  Descriptions of Associations:Loose  Orientation:Full (Time, Place and Person)  Thought Content:Illogical  History of Schizophrenia/Schizoaffective  disorder:No  Duration of Psychotic Symptoms:Less than six months  Hallucinations:Hallucinations: Auditory  Ideas of Reference:Paranoia  Suicidal Thoughts:Suicidal Thoughts: No  Homicidal Thoughts:Homicidal Thoughts: No   Sensorium  Memory:Immediate Fair  Judgment:Impaired  Insight:Poor   Executive Functions  Concentration:Poor  Attention Span:Poor  Recall:Poor  Fund of Knowledge:Fair  Language:Fair   Psychomotor Activity  Psychomotor Activity:Psychomotor Activity: Normal   Assets  Assets:Desire for Improvement; Financial Resources/Insurance; Housing; Social Support; Resilience; Physical Health   Sleep  Sleep:Sleep: Poor    Physical Exam: Physical Exam Vitals and nursing note reviewed.  Constitutional:      Appearance: Normal appearance.  HENT:     Head: Normocephalic and atraumatic.     Mouth/Throat:     Pharynx: Oropharynx is clear.  Eyes:     Pupils: Pupils are equal, round, and reactive to light.  Cardiovascular:     Rate and Rhythm: Normal rate and regular rhythm.  Pulmonary:     Effort: Pulmonary effort is normal.     Breath sounds: Normal breath sounds.  Abdominal:     General: Abdomen is flat.     Palpations: Abdomen is soft.  Musculoskeletal:        General: Normal range of motion.  Skin:    General: Skin is warm and dry.  Neurological:     General: No focal deficit present.     Mental Status: He is alert. Mental status is at baseline.  Psychiatric:        Attention and Perception: He is inattentive. He perceives auditory hallucinations.        Mood and Affect: Mood is anxious.        Speech: Speech normal.        Behavior: Behavior is cooperative.  Thought Content: Thought content is paranoid and delusional.        Cognition and Memory: Cognition is impaired. Memory is impaired.    Review of Systems  Constitutional: Negative.   HENT: Negative.    Eyes: Negative.   Respiratory: Negative.    Cardiovascular: Negative.    Gastrointestinal: Negative.   Musculoskeletal: Negative.   Skin: Negative.   Neurological: Negative.   Psychiatric/Behavioral:  Positive for hallucinations. Negative for suicidal ideas. The patient is nervous/anxious.    Blood pressure 127/78, pulse 87, temperature 97.9 F (36.6 C), temperature source Oral, resp. rate 18, height 5\' 7"  (1.702 m), weight 56 kg, SpO2 97 %. Body mass index is 19.34 kg/m.   COGNITIVE FEATURES THAT CONTRIBUTE TO RISK:  Thought constriction (tunnel vision)    SUICIDE RISK:   Minimal: No identifiable suicidal ideation.  Patients presenting with no risk factors but with morbid ruminations; may be classified as minimal risk based on the severity of the depressive symptoms  PLAN OF CARE: Continue 15-minute checks.  Review medications.  Try to restart medicines based on previous outpatient treatment and make adjustments as needed.  Ongoing assessment of dangerousness prior to discharge  I certify that inpatient services furnished can reasonably be expected to improve the patient's condition.   , MD 04/24/2022, 10:47 AM

## 2022-04-24 NOTE — Plan of Care (Signed)
Patient newly admitted and needs stabilization of psychiatric symptoms.

## 2022-04-24 NOTE — [facility] Suicide Risk Assessment (Signed)
[facility] INPATIENT:  Family/Significant Other Suicide Prevention Education  Suicide Prevention Education:  Education Completed; Eric Hatfield, wife, [id number] has been identified by the patient as the family member/significant other with whom the patient will be residing, and identified as the person(s) who will aid the patient in the event of a mental health crisis (suicidal ideations/suicide attempt).  With written consent from the patient, the family member/significant other has been provided the following suicide prevention education, prior to the and/or following the discharge of the patient.  The suicide prevention education provided includes the following: Suicide risk factors Suicide prevention and interventions National Suicide Hotline telephone number [facility] assessment telephone number [facility] Emergency Assistance 911 [facility] and/or Residential Mobile Crisis Unit telephone number  Request made of family/significant other to: Remove weapons (e.g., guns, rifles, knives), all items previously/currently identified as safety concern.   Remove drugs/medications (over-the-counter, prescriptions, illicit drugs), all items previously/currently identified as a safety concern.  The family member/significant other verbalizes understanding of the suicide prevention education information provided.  The family member/significant other agrees to remove the items of safety concern listed above.  Wife reports patient was admitted because he was "confused and imagining things".  Wife reports no access to weapons.  Wife also reports that patient is not a danger to self or others.  Wife wanted update on MRI.  Eric Hatfield 04/24/2022, 2:07 PM

## 2022-04-24 NOTE — BH IP Treatment Plan (Signed)
Interdisciplinary Treatment and Diagnostic Plan Update  04/24/2022 Time of Session: 9:30AM Eric A Febres Jr. MRN: [id number]  Principal Diagnosis: Bipolar affective disorder (HCC)  Secondary Diagnoses: Principal Problem:   Bipolar affective disorder (HCC)   Current Medications:  Current Facility-Administered Medications  Medication Dose Route Frequency Provider Last Rate Last Admin   acetaminophen (TYLENOL) tablet 650 mg  650 mg Oral Q6H PRN Barthold, Louise F, NP       alum & mag hydroxide-simeth (MAALOX/MYLANTA) 200-200-20 MG/5ML suspension 30 mL  30 mL Oral Q4H PRN Barthold, Louise F, NP       amLODipine (NORVASC) tablet 10 mg  10 mg Oral Daily Barthold, Louise F, NP   10 mg at 04/24/22 0804   busPIRone (BUSPAR) tablet 10 mg  10 mg Oral BID Barthold, Louise F, NP   10 mg at 04/24/22 0804   divalproex (DEPAKOTE) DR tablet 500 mg  500 mg Oral BID Barthold, Louise F, NP   500 mg at 04/24/22 0804   magnesium hydroxide (MILK OF MAGNESIA) suspension 30 mL  30 mL Oral Daily PRN Barthold, Louise F, NP       mirtazapine (REMERON) tablet 7.5 mg  7.5 mg Oral QHS Barthold, Louise F, NP   7.5 mg at 04/23/22 2126   risperiDONE (RISPERDAL) tablet 1-2 mg  1-2 mg Oral BID Barthold, Louise F, NP   1 mg at 04/24/22 0805   PTA Medications: Medications Prior to Admission  Medication Sig Dispense Refill Last Dose   amLODipine (NORVASC) 10 MG tablet TAKE 1 TABLET BY MOUTH DAILY (Patient taking differently: Take 10 mg by mouth daily.) 30 tablet 12 04/23/2022   busPIRone (BUSPAR) 10 MG tablet Take 10 mg by mouth 2 (two) times daily.   04/23/2022   divalproex (DEPAKOTE) 500 MG DR tablet TAKE 1 TABLET BY MOUTH 2 TIMES DAILY. (Patient taking differently: Take 500 mg by mouth 2 (two) times daily.) 60 tablet 0 04/23/2022   mirtazapine (REMERON) 7.5 MG tablet Take 1 tablet (7.5 mg total) by mouth at bedtime. 30 tablet 0 04/22/2022   risperiDONE (RISPERDAL) 2 MG tablet Take 1-2 mg by mouth 2 (two) times daily. 1 mg every  morning and 2 mg at bedtime   04/23/2022   valsartan (DIOVAN) 80 MG tablet TAKE 1 TABLET BY MOUTH DAILY. (Patient taking differently: Take 80 mg by mouth daily.) 90 tablet 4 04/23/2022   carbamide peroxide (DEBROX) 6.5 % OTIC solution Place 5 drops into both ears daily as needed. 15 mL 0 prn at prn   OLANZapine (ZYPREXA) 5 MG tablet Take 5 mg by mouth at bedtime. (Patient not taking: Reported on 04/23/2022)       Patient Stressors:    Patient Strengths:    Treatment Modalities: Medication Management, Group therapy, Case management,  1 to 1 session with clinician, Psychoeducation, Recreational therapy.   Physician Treatment Plan for Primary Diagnosis: Bipolar affective disorder (HCC) Long Term Goal(s):     Short Term Goals:    Medication Management: Evaluate patient's response, side effects, and tolerance of medication regimen.  Therapeutic Interventions: 1 to 1 sessions, Unit Group sessions and Medication administration.  Evaluation of Outcomes: Not Met  Physician Treatment Plan for Secondary Diagnosis: Principal Problem:   Bipolar affective disorder (HCC)  Long Term Goal(s):     Short Term Goals:       Medication Management: Evaluate patient's response, side effects, and tolerance of medication regimen.  Therapeutic Interventions: 1 to 1 sessions, Unit Group sessions and Medication administration.    Evaluation of Outcomes: Not Met   RN Treatment Plan for Primary Diagnosis: Bipolar affective disorder ([place]) Long Term Goal(s): Knowledge of disease and therapeutic regimen to maintain health will improve  Short Term Goals: Ability to remain free from injury will improve, Ability to verbalize frustration and anger appropriately will improve, Ability to demonstrate self-control, Ability to participate in decision making will improve, Ability to verbalize feelings will improve, Ability to disclose and discuss suicidal ideas, Ability to identify and develop effective coping behaviors will  improve, and Compliance with prescribed medications will improve  Medication Management: RN will administer medications as ordered by provider, will assess and evaluate patient's response and provide education to patient for prescribed medication. RN will report any adverse and/or side effects to prescribing provider.  Therapeutic Interventions: 1 on 1 counseling sessions, Psychoeducation, Medication administration, Evaluate responses to treatment, Monitor vital signs and CBGs as ordered, Perform/monitor CIWA, COWS, AIMS and Fall Risk screenings as ordered, Perform wound care treatments as ordered.  Evaluation of Outcomes: Not Met   LCSW Treatment Plan for Primary Diagnosis: Bipolar affective disorder ([place]) Long Term Goal(s): Safe transition to appropriate next level of care at discharge, Engage patient in therapeutic group addressing interpersonal concerns.  Short Term Goals: Engage patient in aftercare planning with referrals and resources, Increase social support, Increase ability to appropriately verbalize feelings, Increase emotional regulation, Facilitate acceptance of mental health diagnosis and concerns, and Increase skills for wellness and recovery  Therapeutic Interventions: Assess for all discharge needs, 1 to 1 time with Social worker, Explore available resources and support systems, Assess for adequacy in community support network, Educate family and significant other(s) on suicide prevention, Complete Psychosocial Assessment, Interpersonal group therapy.  Evaluation of Outcomes: Not Met   Progress in Treatment: Attending groups: No. Participating in groups: No. Taking medication as prescribed: Yes. Toleration medication: Yes. Family/Significant other contact made: No, will contact:  when given permission. Patient understands diagnosis: Yes. Discussing patient identified problems/goals with staff: Yes. Medical problems stabilized or resolved: Yes. Denies suicidal/homicidal  ideation: Yes. Issues/concerns per patient self-inventory: No. Other: none.  New problem(s) identified: No, Describe:  none identified.  New Short Term/Long Term Goal(s): medication management for mood stabilization; elimination of SI thoughts; development of comprehensive mental wellness plan.  Patient Goals:  "To get better quick."  Discharge Plan or Barriers: CSW will assist pt with development of an appropriate aftercare/discharge plan.   Reason for Continuation of Hospitalization: Delusions  Medication stabilization Suicidal ideation  Estimated Length of Stay: 1-7 days  Last 3 [place] Suicide Severity Risk Score: Flowsheet Row Admission (Current) from 04/23/2022 in [place] Most recent reading at 04/24/2022  5:54 AM ED from 04/23/2022 in [place] Most recent reading at 04/23/2022  1:36 PM ED to Hosp-Admission (Discharged) from 04/03/2022 in [place] (1A) Most recent reading at 04/03/2022  6:07 PM  C-SSRS RISK CATEGORY No Risk No Risk No Risk       Last PHQ 2/9 Scores:    09/19/2021    4:06 PM 04/07/2021    4:50 PM 12/27/2020    2:55 PM  Depression screen PHQ 2/9  Decreased Interest 2 0 0  Down, Depressed, Hopeless 0 1 0  PHQ - 2 Score 2 1 0  Altered sleeping 1 0 0  Tired, decreased energy 1 1 0  Change in appetite 0 0 0  Feeling bad or failure about yourself  0 0 0  Trouble concentrating 1 0 0  Moving slowly or fidgety/restless 1 0 0  Suicidal thoughts 0 0 0  PHQ-9 Score 6 2 0  Difficult doing work/chores Not difficult at all  Not difficult at all    Scribe for Treatment Team:  R , LCSW 04/24/2022 9:47 AM   

## 2022-04-24 NOTE — Progress Notes (Signed)
Patient was cooperative and pleasant on shift, he was compliant with treatment and medications. He still voiced of believing that people wanted to tear down his home.  He denies SI, HI & AVH. He does report having a history of Bipolar disorder.

## 2022-04-24 NOTE — Progress Notes (Signed)
Recreation Therapy Notes    Date: 04/24/2022   Time: 10:45 am     Location: Craft room    Behavioral response: N/A   Intervention Topic: Honesty    Discussion/Intervention: Patient refused to attend group.    Clinical Observations/Feedback:  Patient refused to attend group.    [name] LRT/CTRS        Eric Hatfield 04/24/2022 11:23 AM 
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
## 2022-04-24 NOTE — H&P (Signed)
Psychiatric Admission Assessment Adult  Patient Identification: Eric Hatfield Ao. MRN:  [id number] Date of Evaluation:  04/24/2022 Chief Complaint:  Bipolar affective disorder (HCC) [F31.9] Principal Diagnosis: Bipolar disorder, curr episode mixed, severe, with psychotic features (HCC) Diagnosis:  Principal Problem:   Bipolar disorder, curr episode mixed, severe, with psychotic features (HCC) Active Problems:   Essential hypertension   Bipolar affective disorder (HCC)  History of Present Illness: Patient seen and chart reviewed.  Patient admitted to psychiatric unit at the recommendation of his outpatient psychiatrist.  He is continuing to have auditory hallucinations almost constantly.  They are telling him that other people are going to tear his house down because he has not paid his taxes.  According to his outpatient provider he may have recently been saying that the voices were telling him that he should tear his house down although he denies this.  Patient denies suicidal or homicidal thoughts.  Mood is anxious and nervous.  Patient appears to be somewhat distracted but attempting to be forthcoming and cooperative.  Has partial insight but still believes that people are out to get him.  He has been most recently on a total of 3 mg of Risperdal a day and Depakote for a historic diagnosis of bipolar disorder.  Symptoms are becoming worse to the point that his wife is becoming more afraid to stay at home with him because he is going out and supposedly doing things the voices tell him to do. Associated Signs/Symptoms: Depression Symptoms:  psychomotor retardation, impaired memory, anxiety, Duration of Depression Symptoms: Greater than two weeks  (Hypo) Manic Symptoms:  Distractibility, Hallucinations, Impulsivity, Anxiety Symptoms:  Excessive Worry, Psychotic Symptoms:  Delusions, Hallucinations: Auditory Paranoia, PTSD Symptoms: Negative Total Time spent with patient: 1 hour  Past  Psychiatric History: Patient has a historical diagnosis of bipolar disorder going back years.  He had a hospitalization earlier in his life but for many years was stable on carbamazepine.  A little over a year ago that was transiently decreased which may have been the trigger for him starting down the path of worsening hallucinations.  Since then increasing the dose of the Tegretol, and multiple antipsychotics have been tried without any remission of the hallucinations.  He has no past history of suicide attempts or violence.  He is currently on Risperdal 3 mg a day divided and Depakote 500 mg twice a day.  Is the patient at risk to self? Yes.    Has the patient been a risk to self in the past 6 months? Yes.    Has the patient been a risk to self within the distant past? Yes.    Is the patient a risk to others? No.  Has the patient been a risk to others in the past 6 months? No.  Has the patient been a risk to others within the distant past? No.   [place] Scale:  Flowsheet Row Admission (Current) from 04/23/2022 in [facility] INPATIENT BEHAVIORAL MEDICINE Most recent reading at 04/24/2022  5:54 AM ED from 04/23/2022 in [facility] EMERGENCY DEPARTMENT Most recent reading at 04/23/2022  1:36 PM ED to Hosp-Admission (Discharged) from 04/03/2022 in [facility] (1A) Most recent reading at 04/03/2022  6:07 PM  C-SSRS RISK CATEGORY No Risk No Risk No Risk        Prior Inpatient Therapy:   Prior Outpatient Therapy:    Alcohol Screening:   Substance Abuse History in the last 12 months:  No. Consequences of Substance  Abuse: Negative Previous Psychotropic Medications: Yes  Psychological Evaluations: Yes  Past Medical History:  Past Medical History:  Diagnosis Date   Bipolar disorder (HCC)    Hypertension     Past Surgical History:  Procedure Laterality Date   Impacted tooth     Family History:  Family History  Problem Relation Age of Onset    Hypertension Mother    Hyperlipidemia Father    Hypertension Brother    Mental illness Paternal Grandmother    Family Psychiatric  History: Positive for some mental illness in distant relatives Tobacco Screening:   Social History:  Social History   Substance and Sexual Activity  Alcohol Use Not Currently   Comment: quit drking in his 75s.      Social History   Substance and Sexual Activity  Drug Use Not Currently    Additional Social History:                           Allergies:  No Known Allergies Lab Results:  Results for orders placed or performed during the hospital encounter of 04/23/22 (from the past 48 hour(s))  Valproic acid level     Status: None   Collection Time: 04/24/22  6:41 AM  Result Value Ref Range   Valproic Acid Lvl 76 50.0 - 100.0 ug/mL    Comment: Performed at [facility], [address] [place], [place] [address]    Blood Alcohol level:  Lab Results  Component Value Date   [facility] <10 04/23/2022   ETH <10 04/03/2022    Metabolic Disorder Labs:  Lab Results  Component Value Date   HGBA1C 5.7 (H) 04/04/2022   MPG 116.89 04/04/2022   No results found for: "PROLACTIN" Lab Results  Component Value Date   CHOL 211 (H) 06/23/2019   TRIG 40 06/23/2019   HDL 82 06/23/2019   CHOLHDL 2.6 06/23/2019   VLDL 6 04/27/2015   LDLCALC 122 (H) 06/23/2019   LDLCALC 102 (H) 12/11/2016    Current Medications: Current Facility-Administered Medications  Medication Dose Route Frequency Provider Last Rate Last Admin   acetaminophen (TYLENOL) tablet 650 mg  650 mg Oral Q6H PRN Vanetta Mulders, NP       alum & mag hydroxide-simeth (MAALOX/MYLANTA) 200-200-20 MG/5ML suspension 30 mL  30 mL Oral Q4H PRN Vanetta Mulders, NP       amLODipine (NORVASC) tablet 10 mg  10 mg Oral Daily [name], NP   10 mg at 04/24/22 0804   divalproex (DEPAKOTE) DR tablet 500 mg  500 mg Oral BID [name], NP   500 mg at 04/24/22 8832    magnesium hydroxide (MILK OF MAGNESIA) suspension 30 mL  30 mL Oral Daily PRN Vanetta Mulders, NP       risperiDONE (RISPERDAL) tablet 2 mg  2 mg Oral BID Fronia Depass, [name], MD       PTA Medications: Medications Prior to Admission  Medication Sig Dispense Refill Last Dose   amLODipine (NORVASC) 10 MG tablet TAKE 1 TABLET BY MOUTH DAILY (Patient taking differently: Take 10 mg by mouth daily.) 30 tablet 12 04/23/2022   busPIRone (BUSPAR) 10 MG tablet Take 10 mg by mouth 2 (two) times daily.   04/23/2022   divalproex (DEPAKOTE) 500 MG DR tablet TAKE 1 TABLET BY MOUTH 2 TIMES DAILY. (Patient taking differently: Take 500 mg by mouth 2 (two) times daily.) 60 tablet 0 04/23/2022   mirtazapine (REMERON)  7.5 MG tablet Take 1 tablet (7.5 mg total) by mouth at bedtime. 30 tablet 0 04/22/2022   risperiDONE (RISPERDAL) 2 MG tablet Take 1-2 mg by mouth 2 (two) times daily. 1 mg every morning and 2 mg at bedtime   04/23/2022   valsartan (DIOVAN) 80 MG tablet TAKE 1 TABLET BY MOUTH DAILY. (Patient taking differently: Take 80 mg by mouth daily.) 90 tablet 4 04/23/2022   carbamide peroxide (DEBROX) 6.5 % OTIC solution Place 5 drops into both ears daily as needed. 15 mL 0 prn at prn   OLANZapine (ZYPREXA) 5 MG tablet Take 5 mg by mouth at bedtime. (Patient not taking: Reported on 04/23/2022)       Musculoskeletal: Strength & Muscle Tone: within normal limits Gait & Station: normal Patient leans: N/A            Psychiatric Specialty Exam:  Presentation  General Appearance: Appropriate for Environment  Eye Contact:Good  Speech:Clear and Coherent  Speech Volume:Normal  Handedness:No data recorded  Mood and Affect  Mood:Anxious  Affect:Congruent   Thought Process  Thought Processes:Disorganized  Duration of Psychotic Symptoms: Less than six months  Past Diagnosis of Schizophrenia or Psychoactive disorder: No  Descriptions of Associations:Loose  Orientation:Full (Time, Place and  Person)  Thought Content:Illogical  Hallucinations:Hallucinations: Auditory  Ideas of Reference:Paranoia  Suicidal Thoughts:Suicidal Thoughts: No  Homicidal Thoughts:Homicidal Thoughts: No   Sensorium  Memory:Immediate Fair  Judgment:Impaired  Insight:Poor   Executive Functions  Concentration:Poor  Attention Span:Poor  Recall:Poor  Fund of Knowledge:Fair  Language:Fair   Psychomotor Activity  Psychomotor Activity:Psychomotor Activity: Normal   Assets  Assets:Desire for Improvement; Financial Resources/Insurance; Housing; Social Support; Resilience; Physical Health   Sleep  Sleep:Sleep: Poor    Physical Exam: Physical Exam Vitals and nursing note reviewed.  Constitutional:      Appearance: Normal appearance.  HENT:     Head: Normocephalic and atraumatic.     Mouth/Throat:     Pharynx: Oropharynx is clear.  Eyes:     Pupils: Pupils are equal, round, and reactive to light.  Cardiovascular:     Rate and Rhythm: Normal rate and regular rhythm.  Pulmonary:     Effort: Pulmonary effort is normal.     Breath sounds: Normal breath sounds.  Abdominal:     General: Abdomen is flat.     Palpations: Abdomen is soft.  Musculoskeletal:        General: Normal range of motion.  Skin:    General: Skin is warm and dry.  Neurological:     General: No focal deficit present.     Mental Status: He is alert. Mental status is at baseline.  Psychiatric:        Attention and Perception: He is inattentive. He perceives auditory hallucinations.        Mood and Affect: Mood is anxious.        Speech: Speech normal.        Behavior: Behavior is withdrawn.        Thought Content: Thought content is paranoid and delusional.        Cognition and Memory: Cognition is impaired. Memory is impaired.    Review of Systems  Constitutional: Negative.   HENT: Negative.    Eyes: Negative.   Respiratory: Negative.    Cardiovascular: Negative.   Gastrointestinal: Negative.    Musculoskeletal: Negative.   Skin: Negative.   Neurological: Negative.   Psychiatric/Behavioral:  Positive for hallucinations. The patient is nervous/anxious.    Blood pressure 127/78,  pulse 87, temperature 97.9 F (36.6 C), temperature source Oral, resp. rate 18, height 5\' 7"  (1.702 m), weight 56 kg, SpO2 97 %. Body mass index is 19.34 kg/m.  Treatment Plan Summary: Medication management and Plan 66 year old man with bipolar disorder continuing to have auditory hallucinations accompanied by paranoid.  Affect is anxious mildly dysphoric but denies suicidal or homicidal ideation.  Working with him this morning I performed a Montreal cognitive assessment on which he scored 19 out of 30 which is in the mildly abnormal range for his age.  I see that he had a CAT scan previously with some signs of possible microvascular change.  Medically I think it is worth getting an MRI scan at this point to see if there is any indication of reasons for change in his condition.  We will also pursue if there are any other medical tests that need to be performed.  Meanwhile I suggest increasing risk.  I will to 2 mg twice a day continuing other medicine.  Engage in individual and group therapy on the unit monitor for dangerousness prior to any discharge planning  Observation Level/Precautions:  15 minute checks  Laboratory:  Vitamin B-12  Psychotherapy:    Medications:    Consultations:    Discharge Concerns:    Estimated LOS:  Other:     Physician Treatment Plan for Primary Diagnosis: Bipolar disorder, curr episode mixed, severe, with psychotic features (HCC) Long Term Goal(s): Improvement in symptoms so as ready for discharge  Short Term Goals: Ability to disclose and discuss suicidal ideas, Ability to demonstrate self-control will improve, and Ability to identify and develop effective coping behaviors will improve  Physician Treatment Plan for Secondary Diagnosis: Principal Problem:   Bipolar disorder,  curr episode mixed, severe, with psychotic features (HCC) Active Problems:   Essential hypertension   Bipolar affective disorder (HCC)  Long Term Goal(s): Improvement in symptoms so as ready for discharge  Short Term Goals: Ability to maintain clinical measurements within normal limits will improve and Compliance with prescribed medications will improve  I certify that inpatient services furnished can reasonably be expected to improve the patient's condition.    77, MD 8/4/202310:51 AM

## 2022-04-25 DIAGNOSIS — F3164 Bipolar disorder, current episode mixed, severe, with psychotic features: Secondary | ICD-10-CM | POA: Diagnosis not present

## 2022-04-25 NOTE — Plan of Care (Signed)
D: Pt alert and oriented. Pt rates depression 8/10, hopelessness 9/10, and anxiety 8/10. Pt goal: "To get better." Pt reports energy level as low and concentration as being poor. Pt reports sleep last night as being good. Pt did receive medications for sleep and did find them helpful. Pt denies experiencing any pain at this time. Pt denies experiencing any SI/HI, or VH at this time however does report experiencing AH. Pt does not give and details as to what the voices are saying. Pt also shares that he is worried.  Pt observed as having some confusion is regards to orientation to the unit. Pt shared that he believes he remembers being told he was leaving today. This Clinical research associate told that pt that this Clinical research associate had no knowledge of whether that information was true or not, that he needed to speak with the doctor and he would be able to confirm or deny that information. Pt has been calm and cooperative.  A: Scheduled medications administered to pt, per MD orders. Support and encouragement provided. Frequent verbal contact made. Routine safety checks conducted q15 minutes.   R: No adverse drug reactions noted. Pt verbally contracts for safety at this time. Pt compliant with medications and treatment plan. Pt interacts well but minimally with others on the unit. Pt remains safe at this time. Will continue to monitor.   Problem: Clinical Measurements: Goal: Cardiovascular complication will be avoided Outcome: Progressing   Problem: Education: Goal: Knowledge of General Education information will improve Description: Including pain rating scale, medication(s)/side effects and non-pharmacologic comfort measures Outcome: Not Progressing

## 2022-04-25 NOTE — Progress Notes (Signed)
[facility] MD Progress Note  04/25/2022 1:28 PM Eric Hatfield Ao.  MRN:  [id number] Subjective: Patient seen for follow-up.  Patient continues to report auditory hallucinations and paranoid beliefs that someone is going to destroy his house.  Mood is dysphoric.  He got up today and is taking care of his ADLs and is neat and clean but not interacting much with others.  Affect blunted.  Denies suicidal intent.  Looking at the labs the MRI scan was done yesterday.  Diffuse cortical micro lesions typical of chronic micro angio disease.  Vitamin B12 level normal.  Tolerating medicine so far. Principal Problem: Bipolar disorder, curr episode mixed, severe, with psychotic features (HCC) Diagnosis: Principal Problem:   Bipolar disorder, curr episode mixed, severe, with psychotic features (HCC) Active Problems:   Essential hypertension   Bipolar affective disorder (HCC)  Total Time spent with patient: 30 minutes  Past Psychiatric History: Past history of bipolar disorder more recently with depression with hallucinations of been persistent for months.  Past Medical History:  Past Medical History:  Diagnosis Date   Bipolar disorder (HCC)    Hypertension     Past Surgical History:  Procedure Laterality Date   Impacted tooth     Family History:  Family History  Problem Relation Age of Onset   Hypertension Mother    Hyperlipidemia Father    Hypertension Brother    Mental illness Paternal Grandmother    Family Psychiatric  History: See previous Social History:  Social History   Substance and Sexual Activity  Alcohol Use Not Currently   Comment: quit drking in his 62s.      Social History   Substance and Sexual Activity  Drug Use Not Currently    Social History   Socioeconomic History   Marital status: Married    Spouse name: Eric Hatfield   Number of children: Not on file   Years of education: Not on file   Highest education level: Not on file  Occupational History   Not on  file  Tobacco Use   Smoking status: Every Day    Packs/day: 0.75    Years: 30.00    Total pack years: 22.50    Types: Cigarettes    Start date: 05/31/1975   Smokeless tobacco: Never   Tobacco comments:    started smoking as a teenager average 1 ppd  Vaping Use   Vaping Use: Never used  Substance and Sexual Activity   Alcohol use: Not Currently    Comment: quit drking in his 18s.    Drug use: Not Currently   Sexual activity: Yes    Partners: Female    Birth control/protection: None  Other Topics Concern   Not on file  Social History Narrative   Not on file   Social Determinants of Health   Financial Resource Strain: Not on file  Food Insecurity: Not on file  Transportation Needs: Not on file  Physical Activity: Not on file  Stress: Not on file  Social Connections: Not on file   Additional Social History:                         Sleep: Fair  Appetite:  Fair  Current Medications: Current Facility-Administered Medications  Medication Dose Route Frequency Provider Last Rate Last Admin   acetaminophen (TYLENOL) tablet 650 mg  650 mg Oral Q6H PRN Vanetta Mulders, NP       alum & mag hydroxide-simeth (MAALOX/MYLANTA) 200-200-20 MG/5ML  suspension 30 mL  30 mL Oral Q4H PRN Vanetta Mulders, NP       amLODipine (NORVASC) tablet 10 mg  10 mg Oral Daily [name], NP   10 mg at 04/25/22 0751   divalproex (DEPAKOTE) DR tablet 500 mg  500 mg Oral BID Vanetta Mulders, NP   500 mg at 04/25/22 0751   magnesium hydroxide (MILK OF MAGNESIA) suspension 30 mL  30 mL Oral Daily PRN Vanetta Mulders, NP       risperiDONE (RISPERDAL) tablet 2 mg  2 mg Oral BID [name], [name], MD   2 mg at 04/25/22 0258    Lab Results:  Results for orders placed or performed during the hospital encounter of 04/23/22 (from the past 48 hour(s))  Valproic acid level     Status: None   Collection Time: 04/24/22  6:41 AM  Result Value Ref Range   Valproic Acid Lvl 76 50.0 - 100.0  ug/mL    Comment: Performed at [facility], [address] [place], [place] [address]  Lipid panel     Status: None   Collection Time: 04/24/22  2:00 PM  Result Value Ref Range   Cholesterol 161 0 - 200 mg/dL   Triglycerides 52 <242 mg/dL   HDL 59 >35 mg/dL   Total CHOL/HDL Ratio 2.7 RATIO   VLDL 10 0 - 40 mg/dL   LDL Cholesterol 92 0 - 99 mg/dL    Comment:        Total Cholesterol/HDL:CHD Risk Coronary Heart Disease Risk Table                     Men   Women  1/2 Average Risk   3.4   3.3  Average Risk       5.0   4.4  2 X Average Risk   9.6   7.1  3 X Average Risk  23.4   11.0        Use the calculated Patient Ratio above and the CHD Risk Table to determine the patient's CHD Risk.        ATP III CLASSIFICATION (LDL):  <100     mg/dL   Optimal  361-443  mg/dL   Near or Above                    Optimal  130-159  mg/dL   Borderline  154-008  mg/dL   High  >676     mg/dL   Very High Performed at [facility], [address] [place], [place] [address]   Vitamin B12     Status: None   Collection Time: 04/24/22  2:00 PM  Result Value Ref Range   Vitamin B-12 693 180 - 914 pg/mL    Comment: (NOTE) This assay is not validated for testing neonatal or myeloproliferative syndrome specimens for Vitamin B12 levels. Performed at [facility], [address] [place], [place] [address]     Blood Alcohol level:  Lab Results  Component Value Date   [facility] <10 04/23/2022   ETH <10 04/03/2022    Metabolic Disorder Labs: Lab Results  Component Value Date   HGBA1C 5.7 (H) 04/04/2022   MPG 116.89 04/04/2022   No results found for: "PROLACTIN" Lab Results  Component Value Date   CHOL 161 04/24/2022   TRIG 52 04/24/2022   HDL 59 04/24/2022   CHOLHDL 2.7 04/24/2022   VLDL 10 04/24/2022  LDLCALC 92 04/24/2022   LDLCALC 122 (H) 06/23/2019    Physical Findings: AIMS:  , ,  ,  ,    CIWA:    COWS:     Musculoskeletal: Strength & Muscle Tone:  within normal limits Gait & Station: normal Patient leans: N/A  Psychiatric Specialty Exam:  Presentation  General Appearance: Appropriate for Environment  Eye Contact:Good  Speech:Clear and Coherent  Speech Volume:Normal  Handedness:No data recorded  Mood and Affect  Mood:Anxious  Affect:Congruent   Thought Process  Thought Processes:Disorganized  Descriptions of Associations:Loose  Orientation:Full (Time, Place and Person)  Thought Content:Illogical  History of Schizophrenia/Schizoaffective disorder:No  Duration of Psychotic Symptoms:Less than six months  Hallucinations:No data recorded Ideas of Reference:Paranoia  Suicidal Thoughts:No data recorded Homicidal Thoughts:No data recorded  Sensorium  Memory:Immediate Fair  Judgment:Impaired  Insight:Poor   Executive Functions  Concentration:Poor  Attention Span:Poor  Recall:Poor  Fund of Knowledge:Fair  Language:Fair   Psychomotor Activity  Psychomotor Activity:No data recorded  Assets  Assets:Desire for Improvement; Financial Resources/Insurance; Housing; Social Support; Resilience; Physical Health   Sleep  Sleep:No data recorded   Physical Exam: Physical Exam Vitals and nursing note reviewed.  Constitutional:      Appearance: Normal appearance.  HENT:     Head: Normocephalic and atraumatic.     Mouth/Throat:     Pharynx: Oropharynx is clear.  Eyes:     Pupils: Pupils are equal, round, and reactive to light.  Cardiovascular:     Rate and Rhythm: Normal rate and regular rhythm.  Pulmonary:     Effort: Pulmonary effort is normal.     Breath sounds: Normal breath sounds.  Abdominal:     General: Abdomen is flat.     Palpations: Abdomen is soft.  Musculoskeletal:        General: Normal range of motion.  Skin:    General: Skin is warm and dry.  Neurological:     General: No focal deficit present.     Mental Status: He is alert. Mental status is at baseline.  Psychiatric:         Attention and Perception: Attention normal. He perceives auditory hallucinations.        Mood and Affect: Mood is depressed. Affect is blunt.        Speech: Speech is delayed.        Behavior: Behavior is slowed.        Thought Content: Thought content is paranoid.        Cognition and Memory: Cognition is impaired.    Review of Systems  Constitutional: Negative.   HENT: Negative.    Eyes: Negative.   Respiratory: Negative.    Cardiovascular: Negative.   Gastrointestinal: Negative.   Musculoskeletal: Negative.   Skin: Negative.   Neurological: Negative.   Psychiatric/Behavioral:  Positive for depression and hallucinations. Negative for substance abuse and suicidal ideas. The patient is nervous/anxious.    Blood pressure (!) 143/82, pulse 87, temperature 98.2 F (36.8 C), resp. rate 18, height 5\' 7"  (1.702 m), weight 56 kg, SpO2 97 %. Body mass index is 19.34 kg/m.   Treatment Plan Summary: Medication management and Plan continue current Risperdal 2 mg twice a day.  Wife called and requested a return phone call which I will attempt later.  Encouragement and education about the treatment plan for patient.  Patient is not showing any signs of acute dangerousness at this point may only need to stay a short period of time.  , MD 04/25/2022, 1:28  PM

## 2022-04-26 ENCOUNTER — Encounter: Payer: Self-pay | Admitting: Psychiatry

## 2022-04-26 DIAGNOSIS — F3164 Bipolar disorder, current episode mixed, severe, with psychotic features: Secondary | ICD-10-CM | POA: Diagnosis not present

## 2022-04-26 MED ORDER — RISPERIDONE 1 MG PO TABS
2.0000 mg | ORAL_TABLET | Freq: Every day | ORAL | Status: DC
  Administered 2022-04-27: 2 mg via ORAL
  Filled 2022-04-26: qty 2

## 2022-04-26 MED ORDER — RISPERIDONE 1 MG PO TABS
3.0000 mg | ORAL_TABLET | Freq: Every day | ORAL | Status: DC
  Administered 2022-04-26: 3 mg via ORAL
  Filled 2022-04-26: qty 3

## 2022-04-26 NOTE — Progress Notes (Signed)
Pt calm and pleasant during assessment denying SI/HI/VH. Pt endorses anxiety, depression, and AH. Pt observed interacting appropriately with staff and peers on the unit. Pt compliant with medication administration per MD orders. Pt given education, support, and encouragement to be active in his treatment plan. Pt being monitored Q 15 minutes for safety per unit protocol. Pt remains safe on the unit.

## 2022-04-26 NOTE — Progress Notes (Signed)
   04/26/22 0100  Psych Admission Type (Psych Patients Only)  Admission Status Involuntary  Psychosocial Assessment  Patient Complaints Anxiety;Hopelessness  Eye Contact Fair  Facial Expression Anxious  Affect Anxious  Speech Logical/coherent  Interaction Assertive  Motor Activity Slow  Appearance/Hygiene Unremarkable  Behavior Characteristics Cooperative;Anxious  Mood Depressed;Pleasant  Thought Process  Coherency WDL  Content Preoccupation  Delusions Paranoid;Persecutory  Perception Hallucinations  Hallucination Auditory  Judgment Impaired  Confusion Mild  Danger to Self  Current suicidal ideation? Denies  Danger to Others  Danger to Others None reported or observed

## 2022-04-26 NOTE — Plan of Care (Signed)
D: Pt alert and oriented. Pt rates depression 7/10, hopelessness 7/10, and anxiety 6/10. Pt goal: "To feel better." Pt reports energy level as low and concentration as being good. Pt reports sleep last night as being good. Pt did not receive medications for sleep. Pt denies experiencing any pain at this time. Pt denies/reports experiencing any SI/HI, or VH at this time however, endorse AH without details of what voices are saying.   Pt's wife has called numerous times with different concerns. MD notified and has spoken with wife in depth about results. Wife called this evening wanting to speak to the MD about husband taking a specific medication for his blood pressure. Message has been sent to MD and wife states she will call tomorrow.   A: Scheduled medications administered to pt, per MD orders. Support and encouragement provided. Frequent verbal contact made. Routine safety checks conducted q15 minutes.   R: No adverse drug reactions noted. Pt verbally contracts for safety at this time. Pt compliant with medications and treatment plan. Pt interacts well with others on the unit. Pt remains safe at this time. Will continue to monitor.   Problem: Activity: Goal: Risk for activity intolerance will decrease Outcome: Progressing   Problem: Coping: Goal: Level of anxiety will decrease Outcome: Not Progressing

## 2022-04-26 NOTE — Progress Notes (Signed)
[facility] MD Progress Note  04/26/2022 11:40 AM Eric Hatfield.  MRN:  [id number] Subjective: Follow-up for this patient with psychotic symptoms and bipolar depression.  Continues to feel paranoid that someone is going to destroy his house.  Continues to have hallucinations.  Affect is dysphoric and down.  Mostly sits around wringing his hands. Principal Problem: Bipolar disorder, curr episode mixed, severe, with psychotic features (HCC) Diagnosis: Principal Problem:   Bipolar disorder, curr episode mixed, severe, with psychotic features (HCC) Active Problems:   Essential hypertension   Bipolar affective disorder (HCC)  Total Time spent with patient: 30 minutes  Past Psychiatric History: Past history of bipolar disorder  Past Medical History:  Past Medical History:  Diagnosis Date   Bipolar disorder (HCC)    Hypertension     Past Surgical History:  Procedure Laterality Date   Impacted tooth     Family History:  Family History  Problem Relation Age of Onset   Hypertension Mother    Hyperlipidemia Father    Hypertension Brother    Mental illness Paternal Grandmother    Family Psychiatric  History: See previous Social History:  Social History   Substance and Sexual Activity  Alcohol Use Not Currently   Comment: quit drking in his 29s.      Social History   Substance and Sexual Activity  Drug Use Not Currently    Social History   Socioeconomic History   Marital status: Married    Spouse name: Nussen Pullin   Number of children: Not on file   Years of education: Not on file   Highest education level: Not on file  Occupational History   Not on file  Tobacco Use   Smoking status: Every Day    Packs/day: 0.75    Years: 30.00    Total pack years: 22.50    Types: Cigarettes    Start date: 05/31/1975   Smokeless tobacco: Never   Tobacco comments:    started smoking as a teenager average 1 ppd  Vaping Use   Vaping Use: Never used  Substance and Sexual  Activity   Alcohol use: Not Currently    Comment: quit drking in his 64s.    Drug use: Not Currently   Sexual activity: Yes    Partners: Female    Birth control/protection: None  Other Topics Concern   Not on file  Social History Narrative   Not on file   Social Determinants of Health   Financial Resource Strain: Not on file  Food Insecurity: Not on file  Transportation Needs: Not on file  Physical Activity: Not on file  Stress: Not on file  Social Connections: Not on file   Additional Social History:                         Sleep: Fair  Appetite:  Fair  Current Medications: Current Facility-Administered Medications  Medication Dose Route Frequency Provider Last Rate Last Admin   acetaminophen (TYLENOL) tablet 650 mg  650 mg Oral Q6H PRN Vanetta Mulders, NP       alum & mag hydroxide-simeth (MAALOX/MYLANTA) 200-200-20 MG/5ML suspension 30 mL  30 mL Oral Q4H PRN [name], NP       amLODipine (NORVASC) tablet 10 mg  10 mg Oral Daily [name], NP   10 mg at 04/26/22 0833   divalproex (DEPAKOTE) DR tablet 500 mg  500 mg Oral BID Vanetta Mulders, NP  500 mg at 04/26/22 7416   magnesium hydroxide (MILK OF MAGNESIA) suspension 30 mL  30 mL Oral Daily PRN Vanetta Mulders, NP       [START ON 04/27/2022] risperiDONE (RISPERDAL) tablet 2 mg  2 mg Oral Daily Joscelin Fray T, MD       risperiDONE (RISPERDAL) tablet 3 mg  3 mg Oral QHS Skyler Carel, [name], MD        Lab Results:  Results for orders placed or performed during the hospital encounter of 04/23/22 (from the past 48 hour(s))  Lipid panel     Status: None   Collection Time: 04/24/22  2:00 PM  Result Value Ref Range   Cholesterol 161 0 - 200 mg/dL   Triglycerides 52 <384 mg/dL   HDL 59 >53 mg/dL   Total CHOL/HDL Ratio 2.7 RATIO   VLDL 10 0 - 40 mg/dL   LDL Cholesterol 92 0 - 99 mg/dL    Comment:        Total Cholesterol/HDL:CHD Risk Coronary Heart Disease Risk Table                      Men   Women  1/2 Average Risk   3.4   3.3  Average Risk       5.0   4.4  2 X Average Risk   9.6   7.1  3 X Average Risk  23.4   11.0        Use the calculated Patient Ratio above and the CHD Risk Table to determine the patient's CHD Risk.        ATP III CLASSIFICATION (LDL):  <100     mg/dL   Optimal  646-803  mg/dL   Near or Above                    Optimal  130-159  mg/dL   Borderline  212-248  mg/dL   High  >250     mg/dL   Very High Performed at [facility], [address] [place], [place] [address]   Vitamin B12     Status: None   Collection Time: 04/24/22  2:00 PM  Result Value Ref Range   Vitamin B-12 693 180 - 914 pg/mL    Comment: (NOTE) This assay is not validated for testing neonatal or myeloproliferative syndrome specimens for Vitamin B12 levels. Performed at [facility], [address], [place], [place] [address]     Blood Alcohol level:  Lab Results  Component Value Date   [facility] <10 04/23/2022   ETH <10 04/03/2022    Metabolic Disorder Labs: Lab Results  Component Value Date   HGBA1C 5.7 (H) 04/04/2022   MPG 116.89 04/04/2022   No results found for: "PROLACTIN" Lab Results  Component Value Date   CHOL 161 04/24/2022   TRIG 52 04/24/2022   HDL 59 04/24/2022   CHOLHDL 2.7 04/24/2022   VLDL 10 04/24/2022   LDLCALC 92 04/24/2022   LDLCALC 122 (H) 06/23/2019    Physical Findings: AIMS:  , ,  ,  ,    CIWA:    COWS:     Musculoskeletal: Strength & Muscle Tone: within normal limits Gait & Station: normal Patient leans: N/A  Psychiatric Specialty Exam:  Presentation  General Appearance: Appropriate for Environment  Eye Contact:Good  Speech:Clear and Coherent  Speech Volume:Normal  Handedness:No data recorded  Mood and Affect  Mood:Anxious  Affect:Congruent   Thought Process  Thought Processes:Disorganized  Descriptions of Associations:Loose  Orientation:Full (Time, Place and Person)  Thought  Content:Illogical  History of Schizophrenia/Schizoaffective disorder:No  Duration of Psychotic Symptoms:Less than six months  Hallucinations:No data recorded Ideas of Reference:Paranoia  Suicidal Thoughts:No data recorded Homicidal Thoughts:No data recorded  Sensorium  Memory:Immediate Fair  Judgment:Impaired  Insight:Poor   Executive Functions  Concentration:Poor  Attention Span:Poor  Recall:Poor  Fund of Knowledge:Fair  Language:Fair   Psychomotor Activity  Psychomotor Activity:No data recorded  Assets  Assets:Desire for Improvement; Financial Resources/Insurance; Housing; Social Support; Resilience; Physical Health   Sleep  Sleep:No data recorded   Physical Exam: Physical Exam Vitals reviewed.  Constitutional:      Appearance: Normal appearance.  HENT:     Head: Normocephalic and atraumatic.     Mouth/Throat:     Pharynx: Oropharynx is clear.  Eyes:     Pupils: Pupils are equal, round, and reactive to light.  Cardiovascular:     Rate and Rhythm: Normal rate and regular rhythm.  Pulmonary:     Effort: Pulmonary effort is normal.     Breath sounds: Normal breath sounds.  Abdominal:     General: Abdomen is flat.     Palpations: Abdomen is soft.  Musculoskeletal:        General: Normal range of motion.  Skin:    General: Skin is warm and dry.  Neurological:     General: No focal deficit present.     Mental Status: He is alert. Mental status is at baseline.  Psychiatric:        Attention and Perception: He is inattentive. He perceives auditory hallucinations.        Mood and Affect: Mood is anxious and depressed. Affect is blunt.        Speech: Speech is delayed.        Behavior: Behavior is slowed.        Thought Content: Thought content is paranoid.    Review of Systems  Constitutional: Negative.   HENT: Negative.    Eyes: Negative.   Respiratory: Negative.    Cardiovascular: Negative.   Gastrointestinal: Negative.   Musculoskeletal:  Negative.   Skin: Negative.   Neurological: Negative.   Psychiatric/Behavioral:  Positive for depression and hallucinations. The patient is nervous/anxious.    Blood pressure (!) 138/97, pulse (!) 108, temperature (!) 97.4 F (36.3 C), temperature source Oral, resp. rate 18, height 5\' 7"  (1.702 m), weight 56 kg, SpO2 97 %. Body mass index is 19.34 kg/m.   Treatment Plan Summary: Medication management and Plan patient continues to be very symptomatic although there is no indication that he is of any threat to himself or others acutely.  Plan to increase Risperdal to 2 mg in the morning 3 mg at night.  Spoke with his primary psychiatrist today.  Patient might be eligible for discharge soon as he and his wife are very anxious about it but on the other hand he does not see much better.  Patient could be a candidate for ECT.  I brought it up with him today and he seemed rather taken aback by it.  This might need to be something we discuss again and gradually introduce.  , MD 04/26/2022, 11:40 AM

## 2022-04-26 NOTE — [facility] Group Notes (Signed)
LCSW Wellness Group Note   04/26/2022 1:00pm  Type of Group and Topic: Psychoeducational Group:  Wellness  Participation Level:  moderate  Description of Group  Wellness group introduces the topic and its focus on developing healthy habits across the spectrum and its relationship to a decrease in hospital admissions.  Six areas of wellness are discussed: physical, social spiritual, intellectual, occupational, and emotional.  Patients are asked to consider their current wellness habits and to identify areas of wellness where they are interested and able to focus on improvements.    Therapeutic Goals Patients will understand components of wellness and how they can positively impact overall health.  Patients will identify areas of wellness where they have developed good habits. Patients will identify areas of wellness where they would like to make improvements.    Summary of Patient Progress: Pt was engaged in group, made several comments. Pt also somewhat distracted by another group member who was frequently off topic.  Pt identified emotional as one positive wellness area in his life and identified financial wellness as an area that needs some work.

## 2022-04-27 ENCOUNTER — Telehealth: Payer: Self-pay | Admitting: Family Medicine

## 2022-04-27 DIAGNOSIS — F3164 Bipolar disorder, current episode mixed, severe, with psychotic features: Secondary | ICD-10-CM | POA: Diagnosis not present

## 2022-04-27 MED ORDER — RISPERIDONE 3 MG PO TABS
3.0000 mg | ORAL_TABLET | Freq: Every day | ORAL | 0 refills | Status: DC
Start: 2022-04-28 — End: 2022-05-08

## 2022-04-27 NOTE — Plan of Care (Signed)
  Problem: Education: Goal: Knowledge of General Education information will improve Description: Including pain rating scale, medication(s)/side effects and non-pharmacologic comfort measures Outcome: Adequate for Discharge   Problem: Health Behavior/Discharge Planning: Goal: Ability to manage health-related needs will improve Outcome: Adequate for Discharge   Problem: Clinical Measurements: Goal: Ability to maintain clinical measurements within normal limits will improve Outcome: Adequate for Discharge Goal: Will remain free from infection Outcome: Adequate for Discharge Goal: Diagnostic test results will improve Outcome: Adequate for Discharge Goal: Respiratory complications will improve Outcome: Adequate for Discharge Goal: Cardiovascular complication will be avoided Outcome: Adequate for Discharge   Problem: Activity: Goal: Risk for activity intolerance will decrease Outcome: Adequate for Discharge   Problem: Nutrition: Goal: Adequate nutrition will be maintained Outcome: Adequate for Discharge   Problem: Coping: Goal: Level of anxiety will decrease Outcome: Adequate for Discharge   Problem: Elimination: Goal: Will not experience complications related to bowel motility Outcome: Adequate for Discharge Goal: Will not experience complications related to urinary retention Outcome: Adequate for Discharge   Problem: Pain Managment: Goal: General experience of comfort will improve Outcome: Adequate for Discharge   Problem: Safety: Goal: Ability to remain free from injury will improve Outcome: Adequate for Discharge   Problem: Skin Integrity: Goal: Risk for impaired skin integrity will decrease Outcome: Adequate for Discharge   Problem: Activity: Goal: Will verbalize the importance of balancing activity with adequate rest periods 04/27/2022 1011 by Hyman Hopes, RN Outcome: Adequate for Discharge 04/27/2022 0909 by Hyman Hopes, RN Outcome: Progressing    Problem: Education: Goal: Will be free of psychotic symptoms Outcome: Adequate for Discharge Goal: Knowledge of the prescribed therapeutic regimen will improve 04/27/2022 1011 by Hyman Hopes, RN Outcome: Adequate for Discharge 04/27/2022 0909 by Hyman Hopes, RN Outcome: Progressing   Problem: Coping: Goal: Coping ability will improve Outcome: Adequate for Discharge Goal: Will verbalize feelings Outcome: Adequate for Discharge   Problem: Health Behavior/Discharge Planning: Goal: Compliance with prescribed medication regimen will improve 04/27/2022 1011 by Hyman Hopes, RN Outcome: Adequate for Discharge 04/27/2022 0909 by Hyman Hopes, RN Outcome: Progressing   Problem: Nutritional: Goal: Ability to achieve adequate nutritional intake will improve 04/27/2022 1011 by Hyman Hopes, RN Outcome: Adequate for Discharge 04/27/2022 0909 by Hyman Hopes, RN Outcome: Progressing   Problem: Role Relationship: Goal: Ability to communicate needs accurately will improve Outcome: Adequate for Discharge Goal: Ability to interact with others will improve Outcome: Adequate for Discharge   Problem: Safety: Goal: Ability to redirect hostility and anger into socially appropriate behaviors will improve Outcome: Adequate for Discharge Goal: Ability to remain free from injury will improve Outcome: Adequate for Discharge   Problem: Self-Care: Goal: Ability to participate in self-care as condition permits will improve Outcome: Adequate for Discharge   Problem: Self-Concept: Goal: Will verbalize positive feelings about self Outcome: Adequate for Discharge

## 2022-04-27 NOTE — Progress Notes (Signed)
Recreation Therapy Notes  INPATIENT RECREATION TR PLAN  Patient Details Name: Eric Hatfield. MRN: [id number] DOB: 05/13/1956 Today's Date: 04/27/2022  Rec Therapy Plan Is patient appropriate for Therapeutic Recreation?: Yes Treatment times per week: at least 3 Estimated Length of Stay: 5-7 days TR Treatment/Interventions: Group participation (Comment)  Discharge Criteria Pt will be discharged from therapy if:: Discharged Treatment plan/goals/alternatives discussed and agreed upon by:: Patient/family  Discharge Summary Short term goals set: Patient will engage in groups without prompting or encouragement from LRT x3 group sessions within 5 recreation therapy group sessions Short term goals met: Not met Reason goals not met: Patient did not attend any groups Therapeutic equipment acquired: N/A Reason patient discharged from therapy: Discharge from hospital Pt/family agrees with progress & goals achieved: Yes Date patient discharged from therapy: 04/27/22   Emori Mumme 04/27/2022, 2:35 PM

## 2022-04-27 NOTE — Progress Notes (Signed)
Patient ID: Eric Kindler., male   DOB: October 29, 1955, 66 y.o.   MRN: [id number] Patient denies SI/HI/AVH. Belongings were returned to patient. Discharge instructions  including medication and follow up information were reviewed with patient and understanding was verbalized. Patient was not observed to be in distress at time of discharge. Patient was escorted out with staff to medical mall to be transported home by partner.

## 2022-04-27 NOTE — BHH Suicide Risk Assessment (Signed)
[facility] Discharge Suicide Risk Assessment   Principal Problem: Bipolar disorder, curr episode mixed, severe, with psychotic features (HCC) Discharge Diagnoses: Principal Problem:   Bipolar disorder, curr episode mixed, severe, with psychotic features (HCC) Active Problems:   Essential hypertension   Bipolar affective disorder (HCC)   Total Time spent with patient: 30 minutes  Musculoskeletal: Strength & Muscle Tone: within normal limits Gait & Station: normal Patient leans: N/A  Psychiatric Specialty Exam  Presentation  General Appearance: Appropriate for Environment  Eye Contact:Good  Speech:Clear and Coherent  Speech Volume:Normal  Handedness:No data recorded  Mood and Affect  Mood:Anxious  Duration of Depression Symptoms: Greater than two weeks  Affect:Congruent   Thought Process  Thought Processes:Disorganized  Descriptions of Associations:Loose  Orientation:Full (Time, Place and Person)  Thought Content:Illogical  History of Schizophrenia/Schizoaffective disorder:No  Duration of Psychotic Symptoms:Less than six months  Hallucinations:No data recorded Ideas of Reference:Paranoia  Suicidal Thoughts:No data recorded Homicidal Thoughts:No data recorded  Sensorium  Memory:Immediate Fair  Judgment:Impaired  Insight:Poor   Executive Functions  Concentration:Poor  Attention Span:Poor  Recall:Poor  Fund of Knowledge:Fair  Language:Fair   Psychomotor Activity  Psychomotor Activity:No data recorded  Assets  Assets:Desire for Improvement; Financial Resources/Insurance; Housing; Social Support; Resilience; Physical Health   Sleep  Sleep:No data recorded  Physical Exam: Physical Exam Vitals and nursing note reviewed.  Constitutional:      Appearance: Normal appearance.  HENT:     Head: Normocephalic and atraumatic.     Mouth/Throat:     Pharynx: Oropharynx is clear.  Eyes:     Pupils: Pupils are equal, round, and reactive to light.   Cardiovascular:     Rate and Rhythm: Normal rate and regular rhythm.  Pulmonary:     Effort: Pulmonary effort is normal.     Breath sounds: Normal breath sounds.  Abdominal:     General: Abdomen is flat.     Palpations: Abdomen is soft.  Musculoskeletal:        General: Normal range of motion.  Skin:    General: Skin is warm and dry.  Neurological:     General: No focal deficit present.     Mental Status: He is alert. Mental status is at baseline.  Psychiatric:        Attention and Perception: He is inattentive.        Mood and Affect: Mood is anxious.        Speech: Speech is delayed.        Behavior: Behavior is slowed.        Thought Content: Thought content is paranoid. Thought content does not include homicidal or suicidal ideation.        Cognition and Memory: Memory is impaired.    Review of Systems  Constitutional: Negative.   HENT: Negative.    Eyes: Negative.   Respiratory: Negative.    Cardiovascular: Negative.   Gastrointestinal: Negative.   Musculoskeletal: Negative.   Skin: Negative.   Neurological: Negative.   Psychiatric/Behavioral:  Positive for hallucinations. Negative for depression, substance abuse and suicidal ideas. The patient is nervous/anxious.    Blood pressure (!) 153/89, pulse 100, temperature 97.8 F (36.6 C), temperature source Oral, resp. rate 18, height 5\' 7"  (1.702 m), weight 56 kg, SpO2 97 %. Body mass index is 19.34 kg/m.  Mental Status Per Nursing Assessment::   On Admission:  NA  Demographic Factors:  Male, Age 64 or older, and Caucasian  Loss Factors: Decline in physical health  Historical Factors: Impulsivity  Risk Reduction Factors:   Sense of responsibility to family, Religious beliefs about death, Living with another person, especially a relative, Positive social support, Positive therapeutic relationship, and Positive coping skills or problem solving skills  Continued Clinical Symptoms:  Bipolar Disorder:    Depressive phase  Cognitive Features That Contribute To Risk:  Thought constriction (tunnel vision)    Suicide Risk:  Minimal: No identifiable suicidal ideation.  Patients presenting with no risk factors but with morbid ruminations; may be classified as minimal risk based on the severity of the depressive symptoms    Plan Of Care/Follow-up recommendations:  Patient denies any suicidal or homicidal thoughts.  He agrees to follow-up with outpatient mental health provider.  No dangerous behavior noted here in the hospital.  Prescription for medication to be supplied at discharge.  Mordecai Rasmussen, MD 04/27/2022, 10:23 AM

## 2022-04-27 NOTE — Plan of Care (Signed)
  Problem: Activity: Goal: Will verbalize the importance of balancing activity with adequate rest periods Outcome: Progressing   Problem: Education: Goal: Knowledge of the prescribed therapeutic regimen will improve Outcome: Progressing   Problem: Health Behavior/Discharge Planning: Goal: Compliance with prescribed medication regimen will improve Outcome: Progressing   Problem: Nutritional: Goal: Ability to achieve adequate nutritional intake will improve Outcome: Progressing

## 2022-04-27 NOTE — Discharge Summary (Signed)
Physician Discharge Summary Note  Patient:  Eric Hatfield. is an 66 y.o., male MRN:  [id number] DOB:  02/05/1956 Patient phone:  [phone] (home)  Patient address:   [address] [place] [address],  Total Time spent with patient: 30 minutes  Date of Admission:  04/23/2022 Date of Discharge: 04/27/2022  Reason for Admission: Patient was admitted because of persistent hallucinations and paranoia as part of depression and bipolar disorder.  Principal Problem: Bipolar disorder, curr episode mixed, severe, with psychotic features [facility]) Discharge Diagnoses: Principal Problem:   Bipolar disorder, curr episode mixed, severe, with psychotic features (HCC) Active Problems:   Essential hypertension   Bipolar affective disorder (HCC)   Past Psychiatric History: History of bipolar disorder previously stable recently with an extended spell of negative mood paranoia and hallucinations  Past Medical History:  Past Medical History:  Diagnosis Date   Bipolar disorder (HCC)    Hypertension     Past Surgical History:  Procedure Laterality Date   Impacted tooth     Family History:  Family History  Problem Relation Age of Onset   Hypertension Mother    Hyperlipidemia Father    Hypertension Brother    Mental illness Paternal Grandmother    Family Psychiatric  History: See previous Social History:  Social History   Substance and Sexual Activity  Alcohol Use Not Currently   Comment: quit drking in his 51s.      Social History   Substance and Sexual Activity  Drug Use Not Currently    Social History   Socioeconomic History   Marital status: Married    Spouse name: Terrill Alperin   Number of children: Not on file   Years of education: Not on file   Highest education level: Not on file  Occupational History   Not on file  Tobacco Use   Smoking status: Every Day    Packs/day: 0.75    Years: 30.00    Total pack years: 22.50    Types: Cigarettes     Start date: 05/31/1975   Smokeless tobacco: Never   Tobacco comments:    started smoking as a teenager average 1 ppd  Vaping Use   Vaping Use: Never used  Substance and Sexual Activity   Alcohol use: Not Currently    Comment: quit drking in his 15s.    Drug use: Not Currently   Sexual activity: Yes    Partners: Female    Birth control/protection: None  Other Topics Concern   Not on file  Social History Narrative   Not on file   Social Determinants of Health   Financial Resource Strain: Not on file  Food Insecurity: Not on file  Transportation Needs: Not on file  Physical Activity: Not on file  Stress: Not on file  Social Connections: Not on file    Hospital Course: Patient admitted to psychiatric unit.  15-minute checks continued.  He displayed no dangerous aggressive violent or suicidal behavior.  He cooperated with treatment plan.  Gradually increased Risperdal to 3 mg twice a day by the time of discharge.  I communicated with his wife and told her that the MRI scan was nonspecific with some signs of chronic "micro strokes" but nothing to definitively explain his symptoms.  Also blood tests including B12 level were normal.  Patient has consistently denied any suicidal or homicidal thoughts.  I have communicated with his outpatient provider.  He will be discharged from the psychiatric ward today home  with current medication and advised to continue following up with his usual psychiatrist.  I have brought up electroconvulsive therapy with the patient and discussed it briefly and will be available if his primary psychiatrist would like to refer him back for ECT.  Physical Findings: AIMS:  , ,  ,  ,    CIWA:    COWS:     Musculoskeletal: Strength & Muscle Tone: within normal limits Gait & Station: normal Patient leans: N/A   Psychiatric Specialty Exam:  Presentation  General Appearance: Appropriate for Environment  Eye Contact:Good  Speech:Clear and Coherent  Speech  Volume:Normal  Handedness:No data recorded  Mood and Affect  Mood:Anxious  Affect:Congruent   Thought Process  Thought Processes:Disorganized  Descriptions of Associations:Loose  Orientation:Full (Time, Place and Person)  Thought Content:Illogical  History of Schizophrenia/Schizoaffective disorder:No  Duration of Psychotic Symptoms:Less than six months  Hallucinations:No data recorded Ideas of Reference:Paranoia  Suicidal Thoughts:No data recorded Homicidal Thoughts:No data recorded  Sensorium  Memory:Immediate Fair  Judgment:Impaired  Insight:Poor   Executive Functions  Concentration:Poor  Attention Span:Poor  [place]   Psychomotor Activity  Psychomotor Activity:No data recorded  Assets  Assets:Desire for Improvement; Financial Resources/Insurance; Housing; Social Support; Resilience; Physical Health   Sleep  Sleep:No data recorded   Physical Exam: Physical Exam Vitals and nursing note reviewed.  Constitutional:      Appearance: Normal appearance.  HENT:     Head: Normocephalic and atraumatic.     Mouth/Throat:     Pharynx: Oropharynx is clear.  Eyes:     Pupils: Pupils are equal, round, and reactive to light.  Cardiovascular:     Rate and Rhythm: Normal rate and regular rhythm.  Pulmonary:     Effort: Pulmonary effort is normal.     Breath sounds: Normal breath sounds.  Abdominal:     General: Abdomen is flat.     Palpations: Abdomen is soft.  Musculoskeletal:        General: Normal range of motion.  Skin:    General: Skin is warm and dry.  Neurological:     General: No focal deficit present.     Mental Status: He is alert. Mental status is at baseline.  Psychiatric:        Attention and Perception: He is inattentive.        Mood and Affect: Mood is anxious.        Speech: Speech is delayed.        Behavior: Behavior is cooperative.        Thought Content: Thought content normal.  Thought content does not include homicidal or suicidal ideation.        Cognition and Memory: Memory is impaired.    Review of Systems  Constitutional: Negative.   HENT: Negative.    Eyes: Negative.   Respiratory: Negative.    Cardiovascular: Negative.   Gastrointestinal: Negative.   Musculoskeletal: Negative.   Skin: Negative.   Neurological: Negative.   Psychiatric/Behavioral:  Positive for depression and hallucinations. Negative for substance abuse and suicidal ideas. The patient is nervous/anxious.    Blood pressure (!) 153/89, pulse 100, temperature 97.8 F (36.6 C), temperature source Oral, resp. rate 18, height 5\' 7"  (1.702 m), weight 56 kg, SpO2 97 %. Body mass index is 19.34 kg/m.   Social History   Tobacco Use  Smoking Status Every Day   Packs/day: 0.75   Years: 30.00   Total pack years: 22.50   Types: Cigarettes  Start date: 05/31/1975  Smokeless Tobacco Never  Tobacco Comments   started smoking as a teenager average 1 ppd   Tobacco Cessation:  Prescription not provided due to an Allergy to all of the FDA-approved tobacco cessation medications   Blood Alcohol level:  Lab Results  Component Value Date   ETH <10 04/23/2022   ETH <10 04/03/2022    Metabolic Disorder Labs:  Lab Results  Component Value Date   HGBA1C 5.7 (H) 04/04/2022   MPG 116.89 04/04/2022   No results found for: "PROLACTIN" Lab Results  Component Value Date   CHOL 161 04/24/2022   TRIG 52 04/24/2022   HDL 59 04/24/2022   CHOLHDL 2.7 04/24/2022   VLDL 10 04/24/2022   LDLCALC 92 04/24/2022   LDLCALC 122 (H) 06/23/2019    See Psychiatric Specialty Exam and Suicide Risk Assessment completed by Attending Physician prior to discharge.  Discharge destination:  Home  Is patient on multiple antipsychotic therapies at discharge:  No   Has Patient had three or more failed trials of antipsychotic monotherapy by history:  No  Recommended Plan for Multiple Antipsychotic  Therapies: NA  Discharge Instructions     Diet - low sodium heart healthy   Complete by: As directed    Increase activity slowly   Complete by: As directed       Allergies as of 04/27/2022   No Known Allergies      Medication List     STOP taking these medications    busPIRone 10 MG tablet Commonly known as: BUSPAR   carbamide peroxide 6.5 % OTIC solution Commonly known as: DEBROX   mirtazapine 7.5 MG tablet Commonly known as: REMERON   OLANZapine 5 MG tablet Commonly known as: ZYPREXA       TAKE these medications      Indication  amLODipine 10 MG tablet Commonly known as: NORVASC TAKE 1 TABLET BY MOUTH DAILY  Indication: High Blood Pressure Disorder   divalproex 500 MG DR tablet Commonly known as: DEPAKOTE TAKE 1 TABLET BY MOUTH 2 TIMES DAILY.  Indication: Depressive Phase of Manic-Depression   risperiDONE 3 MG tablet Commonly known as: RISPERDAL Take 1 tablet (3 mg total) by mouth daily. Start taking on: April 28, 2022 What changed:  medication strength how much to take when to take this additional instructions  Indication: MIXED BIPOLAR AFFECTIVE DISORDER   valsartan 80 MG tablet Commonly known as: DIOVAN TAKE 1 TABLET BY MOUTH DAILY.  Indication: High Blood Pressure Disorder         Follow-up recommendations: Continue current medicine follow-up with outpatient psychiatrist  Comments: Denies any suicidal thoughts.  Reviewed medicine with patient.  Signed: Mordecai Rasmussen, MD 04/27/2022, 10:28 AM

## 2022-04-27 NOTE — Progress Notes (Signed)
  [facility] Adult Case Management Discharge Plan :  Will you be returning to the same living situation after discharge:  Yes,  pt to return home. At discharge, do you have transportation home?: Yes,  pt wife providing transportation home. Do you have the ability to pay for your medications: Yes,  [employer].  Release of information consent forms completed and in the chart;  Patient's signature needed at discharge.  Patient to Follow up at:  Follow-up Information     [facility]. Schedule an appointment as soon as possible for a visit.   Why: Please contact provider to make aftercare appointment. Thanks! Contact information: [address] [place], [place] [address] Phone: [phone]                Next level of care provider has access to [facility] Link:no  Safety Planning and Suicide Prevention discussed: Yes,  SPE completed with wife.     Has patient been referred to the Quitline?: Patient refused referral  Patient has been referred for addiction treatment: N/A  Glenis Smoker, LCSW 04/27/2022, 2:57 PM

## 2022-04-27 NOTE — Progress Notes (Signed)
Recreation Therapy Notes  Date: 04/27/2022   Time: 10:50 am     Location: Craft room    Behavioral response: N/A   Intervention Topic: Self-care    Discussion/Intervention: Patient refused to attend group.    Clinical Observations/Feedback:  Patient refused to attend group.    Devri Kreher LRT/CTRS        Marvin Grabill 04/27/2022 11:31 AM

## 2022-04-27 NOTE — Telephone Encounter (Signed)
Pts wife called to speak with Dr. Sherrie Mustache about medication changes for the pt while in the hospital / please advise

## 2022-04-29 NOTE — Telephone Encounter (Signed)
Pt's wife only wanted to confirm pt's next appt date. No other questions.

## 2022-05-02 ENCOUNTER — Other Ambulatory Visit: Payer: Self-pay

## 2022-05-02 ENCOUNTER — Encounter: Payer: Self-pay | Admitting: Intensive Care

## 2022-05-02 ENCOUNTER — Inpatient Hospital Stay
Admission: AD | Admit: 2022-05-02 | Discharge: 2022-05-08 | DRG: 885 | Disposition: A | Discharge status: Home / Self Care | Payer: PPO | Source: Intra-hospital | Attending: Psychiatry | Admitting: Psychiatry

## 2022-05-02 ENCOUNTER — Emergency Department
Admission: EM | Admit: 2022-05-02 | Discharge: 2022-05-02 | Disposition: A | Discharge status: Other Acute Inpatient Hospital | Payer: PPO | Attending: Emergency Medicine | Admitting: Emergency Medicine

## 2022-05-02 DIAGNOSIS — F17210 Nicotine dependence, cigarettes, uncomplicated: Secondary | ICD-10-CM | POA: Diagnosis present

## 2022-05-02 DIAGNOSIS — I10 Essential (primary) hypertension: Secondary | ICD-10-CM | POA: Insufficient documentation

## 2022-05-02 DIAGNOSIS — Z8249 Family history of ischemic heart disease and other diseases of the circulatory system: Secondary | ICD-10-CM

## 2022-05-02 DIAGNOSIS — Z79899 Other long term (current) drug therapy: Secondary | ICD-10-CM | POA: Diagnosis not present

## 2022-05-02 DIAGNOSIS — Z046 Encounter for general psychiatric examination, requested by authority: Secondary | ICD-10-CM | POA: Diagnosis present

## 2022-05-02 DIAGNOSIS — R443 Hallucinations, unspecified: Secondary | ICD-10-CM | POA: Insufficient documentation

## 2022-05-02 DIAGNOSIS — F319 Bipolar disorder, unspecified: Secondary | ICD-10-CM | POA: Diagnosis present

## 2022-05-02 DIAGNOSIS — Z20822 Contact with and (suspected) exposure to covid-19: Secondary | ICD-10-CM | POA: Diagnosis present

## 2022-05-02 LAB — COMPREHENSIVE METABOLIC PANEL
ALT: 12 U/L (ref 0–44)
AST: 18 U/L (ref 15–41)
Albumin: 3.9 g/dL (ref 3.5–5.0)
Alkaline Phosphatase: 82 U/L (ref 38–126)
Anion gap: 8 (ref 5–15)
BUN: 21 mg/dL (ref 8–23)
CO2: 25 mmol/L (ref 22–32)
Calcium: 9.2 mg/dL (ref 8.9–10.3)
Chloride: 102 mmol/L (ref 98–111)
Creatinine, Ser: 0.96 mg/dL (ref 0.61–1.24)
GFR, Estimated: >60 mL/min (ref >60)
Glucose, Bld: 112 mg/dL — ABNORMAL HIGH (ref 70–99)
Potassium: 4.6 mmol/L (ref 3.5–5.1)
Sodium: 135 mmol/L (ref 135–145)
Total Bilirubin: 0.5 mg/dL (ref 0.3–1.2)
Total Protein: 7.3 g/dL (ref 6.5–8.1)

## 2022-05-02 LAB — CBC
HCT: 42.6 % (ref 39.0–52.0)
Hemoglobin: 14.3 g/dL (ref 13.0–17.0)
MCH: 33.3 pg (ref 26.0–34.0)
MCHC: 33.6 g/dL (ref 30.0–36.0)
MCV: 99.1 fL (ref 80.0–100.0)
Platelets: 241 10*3/uL (ref 150–400)
RBC: 4.3 MIL/uL (ref 4.22–5.81)
RDW: 13.1 % (ref 11.5–15.5)
WBC: 6.7 10*3/uL (ref 4.0–10.5)
nRBC: 0 % (ref 0.0–0.2)

## 2022-05-02 LAB — ACETAMINOPHEN LEVEL: Acetaminophen (Tylenol), Serum: <10 ug/mL — ABNORMAL LOW (ref 10–30)

## 2022-05-02 LAB — RESP PANEL BY RT-PCR (FLU A&B, COVID) ARPGX2
Influenza A by PCR: NEGATIVE
Influenza B by PCR: NEGATIVE
SARS Coronavirus 2 by RT PCR: NEGATIVE

## 2022-05-02 LAB — ETHANOL: Alcohol, Ethyl (B): <10 mg/dL (ref <10)

## 2022-05-02 LAB — SALICYLATE LEVEL: Salicylate Lvl: <7 mg/dL — ABNORMAL LOW (ref 7.0–30.0)

## 2022-05-02 LAB — VALPROIC ACID LEVEL: Valproic Acid Lvl: 86 ug/mL (ref 50.0–100.0)

## 2022-05-02 MED ORDER — RISPERIDONE 1 MG PO TABS
3.0000 mg | ORAL_TABLET | Freq: Every day | ORAL | Status: DC
  Administered 2022-05-02: 3 mg via ORAL
  Filled 2022-05-02: qty 3

## 2022-05-02 MED ORDER — IRBESARTAN 75 MG PO TABS
75.0000 mg | ORAL_TABLET | Freq: Every day | ORAL | Status: DC
  Administered 2022-05-02: 75 mg via ORAL
  Filled 2022-05-02: qty 1

## 2022-05-02 MED ORDER — DIVALPROEX SODIUM 500 MG PO DR TAB
500.0000 mg | DELAYED_RELEASE_TABLET | Freq: Two times a day (BID) | ORAL | Status: DC
  Administered 2022-05-02: 500 mg via ORAL
  Filled 2022-05-02 (×2): qty 1

## 2022-05-02 MED ORDER — AMLODIPINE BESYLATE 5 MG PO TABS
10.0000 mg | ORAL_TABLET | Freq: Every day | ORAL | Status: DC
  Filled 2022-05-02: qty 2

## 2022-05-02 NOTE — ED Triage Notes (Signed)
[name] reports pt recently d/c from geri psych unit but needs to come back in. [name] reports that pt needs to be admitted to geripsych unit for altered mental status.

## 2022-05-02 NOTE — ED Notes (Signed)
Called dietary as pt needs lunch tray. Eric Hatfield states she will have one sent soon.

## 2022-05-02 NOTE — ED Notes (Addendum)
Pt's wife Adonay Scheier requested to speak with psych team to get update from them; notified Counselor Jerilynn Som and Nurse, mental health. [phone]. [phone].

## 2022-05-02 NOTE — ED Notes (Signed)
VOL/pending psych consult 

## 2022-05-02 NOTE — BH Assessment (Signed)
Patient is to be admitted to [facility] by Psychiatric Nurse Practitioner Rashaun.  Attending Physician will be Dr.  Marlou Porch .   Patient has been assigned to room L28, by [facility] Charge Nurse Guinevere Ferrari.   Intake Paper Work has been signed and placed on patient chart.  ER staff is aware of the admission: [facility] ER Secretary   Dr. Delton Prairie, ER MD  [facility] Patient's Nurse  [facility] Patient Access.
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
## 2022-05-02 NOTE — ED Notes (Signed)
Patient is vol pending consult 

## 2022-05-02 NOTE — ED Notes (Signed)
Pt ambulatory to room; steady; calm and cooperative.
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
## 2022-05-02 NOTE — ED Notes (Signed)
Hospital meal provided, pt tolerated w/o complaints.  Waste discarded appropriately.  

## 2022-05-02 NOTE — ED Notes (Signed)
Pt's wife went home to drop off her belongings before coming in to visit pt per first nurse. Pt back to ER to visit pt. Wife allowed to visit for 10 minutes. Security and NT notified.
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
## 2022-05-02 NOTE — ED Notes (Signed)
Psych NP speaking with patient 

## 2022-05-02 NOTE — ED Notes (Signed)
Pt given lunch tray.

## 2022-05-02 NOTE — ED Notes (Signed)
EDP [place] at bedside 
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
## 2022-05-02 NOTE — ED Notes (Signed)
Called lab to add on valproic acid level. Staff states they'll do so now.

## 2022-05-03 ENCOUNTER — Encounter: Payer: Self-pay | Admitting: Psychiatric/Mental Health

## 2022-05-03 DIAGNOSIS — F319 Bipolar disorder, unspecified: Secondary | ICD-10-CM | POA: Diagnosis not present

## 2022-05-03 MED ORDER — ACETAMINOPHEN 325 MG PO TABS: 650.0000 mg | ORAL_TABLET | Freq: Four times a day (QID) | ORAL | Status: DC | PRN

## 2022-05-03 MED ORDER — ALUM & MAG HYDROXIDE-SIMETH 200-200-20 MG/5ML PO SUSP: 30.0000 mL | ORAL | Status: DC | PRN

## 2022-05-03 MED ORDER — TRAZODONE HCL 50 MG PO TABS: 50.0000 mg | ORAL_TABLET | Freq: Every evening | ORAL | Status: DC | PRN

## 2022-05-03 MED ORDER — MAGNESIUM HYDROXIDE 400 MG/5ML PO SUSP: 30.0000 mL | Freq: Every day | ORAL | Status: DC | PRN

## 2022-05-03 MED ORDER — DIVALPROEX SODIUM 250 MG PO DR TAB
500.0000 mg | DELAYED_RELEASE_TABLET | Freq: Two times a day (BID) | ORAL | Status: DC
  Administered 2022-05-03 – 2022-05-08 (×11): 500 mg via ORAL
  Filled 2022-05-03 (×11): qty 2

## 2022-05-03 MED ORDER — HYDROXYZINE HCL 25 MG PO TABS: 25.0000 mg | ORAL_TABLET | Freq: Three times a day (TID) | ORAL | Status: DC | PRN

## 2022-05-03 MED ORDER — AMLODIPINE BESYLATE 5 MG PO TABS
10.0000 mg | ORAL_TABLET | Freq: Every day | ORAL | Status: DC
  Administered 2022-05-03 – 2022-05-08 (×6): 10 mg via ORAL
  Filled 2022-05-03 (×7): qty 2

## 2022-05-03 MED ORDER — IRBESARTAN 75 MG PO TABS
75.0000 mg | ORAL_TABLET | Freq: Every day | ORAL | Status: DC
  Administered 2022-05-03 – 2022-05-08 (×6): 75 mg via ORAL
  Filled 2022-05-03 (×6): qty 1

## 2022-05-03 MED ORDER — RISPERIDONE 1 MG PO TABS
3.0000 mg | ORAL_TABLET | Freq: Every day | ORAL | Status: DC
  Administered 2022-05-03 – 2022-05-08 (×6): 3 mg via ORAL
  Filled 2022-05-03 (×6): qty 3
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
## 2022-05-03 NOTE — BHH Suicide Risk Assessment (Signed)
[facility] Admission Suicide Risk Assessment   Nursing information obtained from:  Patient Demographic factors:  Male, Age 65 or older, Caucasian, Low socioeconomic status Current Mental Status:  NA Loss Factors:  Decrease in vocational status, Financial problems / change in socioeconomic status Historical Factors:  NA Risk Reduction Factors:  Living with another person, especially a relative, Positive therapeutic relationship, Positive social support  Total Time spent with patient: 45 minutes Principal Problem: Bipolar 1 disorder (HCC) Diagnosis:  Principal Problem:   Bipolar 1 disorder (HCC)  Subjective Data: patient admitted to worsened psychosis. Patient denies suicidal thoughts, plans; denies homicidal thoughts, plans.    SUICIDE RISK:   Minimal: No identifiable suicidal ideation.  Patients presenting with no risk factors but with morbid ruminations; may be classified as minimal risk based on the severity of the depressive symptoms     [place] Suicide Severity Rating Scale  Wish to be dead: No  Suicidal thoughts: No                  Suicidal thoughts with method: No                  Suicidal intent: No                  Suicide intent with specific plan: No                  Suicide behavior: No   PLAN OF CARE:   -inpatient psychiatric admission will be continued. -patient will be integrated in the milieu.   -patient will be encouraged to attend groups.   -Medications: We will continue last discharge medications: Depakote 500mg  PO BID, Risperidone 3mg  PO daily.  Patient reportedly had failed past trials of several antipsychotics. Patient may benefit from electroconvulsive therapy. -Disposition will be determined after the patient is stabilized.   I certify that inpatient services furnished can reasonably be expected to improve the patient's condition.   , MD 05/03/2022, 11:30 AM

## 2022-05-03 NOTE — Progress Notes (Signed)
Patient is alert and oriented times 4. Mood and affect appropriate. Patient rates pain as 0/10. He denies SI, HI, and AVH. Patient does endorse feelings of anxiety and depression at this time. States he slept well last night. Evening medicines administered whole by mouth without difficulty. Patient ate snack in day room; appetite was fair. Patient remains on unit with Q15 minute checks in place.    

## 2022-05-03 NOTE — Progress Notes (Signed)
Spoke with patient`s outpatient psychiatrist Dr. Caryn Section. She confirmed that her clinic cancelled patient`s appointment tomorrow 8/14, and she prefers the patient to stay in the hospital, and believes that ECT can be beneficial for him as he failed multiple antipsychotic agents in the past, on Monday
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
## 2022-05-03 NOTE — Group Note (Signed)
ype of Therapy and Topic:  Group Therapy:  Cycle of Depression   Participation Level:  Did not attend   Description of Group:  Patients in this group were introduced to the idea of the cycle of depression. Patients explored how stressors can trigger thoughts, feels and physical symptoms that can make you behave in ways that increase symptoms of depression.  Patient identified specific stressors that have triggered depression and explored thoughts that they have about themselves that may not always be true.   Patients encouraged to come up with positive changes and interventions put in place to stop cycle of depression. Patients also participated in discussion about benefits of being able to identify stressors, thoughts, feels and behavioral responses when depressed.      Therapeutic Goals:               1)  To discuss the positive and negative impacts of depressive feels             2)  identify signs and symptoms of depression             3)  discuss alternative behaviors to stop cycle of depression             4)  offer mutual support to others regarding depression             5)  Developing plans for ways to manage specific stressors upon discharge               Summary of Patient Progress: X   Therapeutic Modalities:   Motivational Interviewing Brief Solution-Focused Therapy     Bronte Sabado, LCSW, LCAS Clincal Social Worker  [facility]   
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
## 2022-05-04 DIAGNOSIS — F319 Bipolar disorder, unspecified: Secondary | ICD-10-CM | POA: Diagnosis not present

## 2022-05-04 MED ORDER — ESCITALOPRAM OXALATE 10 MG PO TABS
10.0000 mg | ORAL_TABLET | Freq: Every day | ORAL | Status: DC
  Administered 2022-05-04 – 2022-05-08 (×5): 10 mg via ORAL
  Filled 2022-05-04 (×5): qty 1

## 2022-05-04 NOTE — [facility] Suicide Risk Assessment (Signed)
[facility] INPATIENT:  Family/Significant Other Suicide Prevention Education  Suicide Prevention Education:  Contact Attempts: Malaki Koury, wife, [phone] has been identified by the patient as the family member/significant other with whom the patient will be residing, and identified as the person(s) who will aid the patient in the event of a mental health crisis.  With written consent from the patient, two attempts were made to provide suicide prevention education, prior to and/or following the patient's discharge.  We were unsuccessful in providing suicide prevention education.  A suicide education pamphlet was given to the patient to share with family/significant other.  Date and time of first attempt:05/04/2022 at 11:25 Date and time of second attempt: Second attempt is needed  CSW unable to leave HIPAA compliant voicemail. Line rang without the option to leave one.   [name] 05/04/2022, 11:25 AM

## 2022-05-04 NOTE — Group Note (Signed)
[facility] LCSW Group Therapy Note    Group Date: 05/04/2022 Start Time: 1300 End Time: 1400  Type of Therapy and Topic:  Group Therapy:  Overcoming Obstacles  Participation Level:  [facility] PARTICIPATION LEVEL: Did Not Attend  Mood:  Description of Group:   In this group patients will be encouraged to explore what they see as obstacles to their own wellness and recovery. They will be guided to discuss their thoughts, feelings, and behaviors related to these obstacles. The group will process together ways to cope with barriers, with attention given to specific choices patients can make. Each patient will be challenged to identify changes they are motivated to make in order to overcome their obstacles. This group will be process-oriented, with patients participating in exploration of their own experiences as well as giving and receiving support and challenge from other group members.  Therapeutic Goals: 1. Patient will identify personal and current obstacles as they relate to admission. 2. Patient will identify barriers that currently interfere with their wellness or overcoming obstacles.  3. Patient will identify feelings, thought process and behaviors related to these barriers. 4. Patient will identify two changes they are willing to make to overcome these obstacles:    Summary of Patient Progress Patient declined to attend group despite being personally invited by this clinician.    Therapeutic Modalities:   Cognitive Behavioral Therapy Solution Focused Therapy Motivational Interviewing Relapse Prevention Therapy   [name], LCSW

## 2022-05-04 NOTE — Progress Notes (Signed)
Patient is alert and oriented times 4. Mood and affect appropriate. Patient rates pain as 0/10. He denies SI, HI, and AVH. Patient does endorse some feelings of anxiety and depression at this time and states that he wants to go home. States he slept well last night. Evening medicines administered whole by mouth without difficulty. Patient ate snack in day room; appetite was fair. Patient remains on unit with Q15 minute checks in place.

## 2022-05-04 NOTE — Progress Notes (Signed)
[facility]  05/04/2022 10:58 AM Eric Ancil BoozerA [name]  MRN:  [id number] Subjective: Patient is seen on rounds.  I spoke with Dr. Excell Seltzerooper for this morning.  He has been on numerous antipsychotics without success although we both agreed that he is probably not consistently taking his medications at home.  He says he is but he told the police that he was not.  Since restarting his medications on admission he seems to be doing better.  I asked him about his delusions of the government wanting to tear his house down and he said maybe.  He does want to leave but I told him he needs to stay for a little longer and he agreed.  He has been compliant with medications and denies any side effects.  He does admit to being a little bit depressed.  I asked if he was interested in ECT and he said no. I believe he could benefit from an antidepressant.  Principal Problem: Bipolar 1 disorder (HCC) Diagnosis: Principal Problem:   Bipolar 1 disorder (HCC)  Total Time spent with patient: 15 minutes  Past Psychiatric History:  Patient has a historical diagnosis of bipolar disorder going back years.  He had a hospitalization earlier in his life but for many years was stable on carbamazepine.  A little over a year ago that was transiently decreased which may have been the trigger for him starting down the path of worsening hallucinations.  Since then increasing the dose of the Tegretol, and multiple antipsychotics have been tried without any remission of the hallucinations.  He has no past history of suicide attempts or violence.  He is currently on Risperdal 3 mg a day divided and Depakote 500 mg twice a day.  Past Medical History:  Past Medical History:  Diagnosis Date   Bipolar disorder (HCC)    Hypertension     Past Surgical History:  Procedure Laterality Date   Impacted tooth     Family History:  Family History  Problem Relation Age of Onset   Hypertension Mother    Hyperlipidemia Father     Hypertension Brother    Mental illness Paternal Grandmother     Social History:  Social History   Substance and Sexual Activity  Alcohol Use Not Currently   Comment: quit drking in his 440s.      Social History   Substance and Sexual Activity  Drug Use Not Currently    Social History   Socioeconomic History   Marital status: Married    Spouse name: Ok EdwardsVickie Sue Hatfield   Number of children: Not on file   Years of education: Not on file   Highest education level: Not on file  Occupational History   Not on file  Tobacco Use   Smoking status: Every Day    Packs/day: 0.75    Years: 30.00    Total pack years: 22.50    Types: Cigarettes    Start date: 05/31/1975   Smokeless tobacco: Never   Tobacco comments:    started smoking as a teenager average 1 ppd  Vaping Use   Vaping Use: Never used  Substance and Sexual Activity   Alcohol use: Not Currently    Comment: quit drking in his 8040s.    Drug use: Not Currently   Sexual activity: Yes    Partners: Female    Birth control/protection: None  Other Topics Concern   Not on file  Social History Narrative   Not on file  Social Determinants of Health   Financial Resource Strain: Not on file  Food Insecurity: Not on file  Transportation Needs: Not on file  Physical Activity: Not on file  Stress: Not on file  Social Connections: Not on file   Additional Social History:                         Sleep: Good  Appetite:  Good  Current Medications: Current Facility-Administered Medications  Medication Dose Route Frequency Provider Last Rate Last Admin   acetaminophen (TYLENOL) tablet 650 mg  650 mg Oral Q6H PRN Jearld Lesch, NP       alum & mag hydroxide-simeth (MAALOX/MYLANTA) 200-200-20 MG/5ML suspension 30 mL  30 mL Oral Q4H PRN Durwin Nora, Rashaun M, NP       amLODipine (NORVASC) tablet 10 mg  10 mg Oral Daily Durwin Nora, Rashaun M, NP   10 mg at 05/04/22 0854   divalproex (DEPAKOTE) DR tablet 500 mg  500 mg  Oral BID Lerry Liner M, NP   500 mg at 05/04/22 0854   hydrOXYzine (ATARAX) tablet 25 mg  25 mg Oral TID PRN Jearld Lesch, NP       irbesartan (AVAPRO) tablet 75 mg  75 mg Oral Daily Dixon, Rashaun M, NP   75 mg at 05/04/22 0854   magnesium hydroxide (MILK OF MAGNESIA) suspension 30 mL  30 mL Oral Daily PRN Jearld Lesch, NP       risperiDONE (RISPERDAL) tablet 3 mg  3 mg Oral Daily Durwin Nora, Rashaun M, NP   3 mg at 05/04/22 0854   traZODone (DESYREL) tablet 50 mg  50 mg Oral QHS PRN Jearld Lesch, NP        Lab Results:  Results for orders placed or performed during the hospital encounter of 05/02/22 (from the past 48 hour(s))  Comprehensive metabolic panel     Status: Abnormal   Collection Time: 05/02/22 11:02 AM  Result Value Ref Range   Sodium 135 135 - 145 mmol/L   Potassium 4.6 3.5 - 5.1 mmol/L   Chloride 102 98 - 111 mmol/L   CO2 25 22 - 32 mmol/L   Glucose, Bld 112 (H) 70 - 99 mg/dL    Comment: Glucose reference range applies only to samples taken after fasting for at least 8 hours.   BUN 21 8 - 23 mg/dL   Creatinine, Ser 2.13 0.61 - 1.24 mg/dL   Calcium 9.2 8.9 - 08.6 mg/dL   Total Protein 7.3 6.5 - 8.1 g/dL   Albumin 3.9 3.5 - 5.0 g/dL   AST 18 15 - 41 U/L   ALT 12 0 - 44 U/L   Alkaline Phosphatase 82 38 - 126 U/L   Total Bilirubin 0.5 0.3 - 1.2 mg/dL   GFR, Estimated >57 >84 mL/min    Comment: (NOTE) Calculated using the CKD-EPI Creatinine Equation (2021)    Anion gap 8 5 - 15    Comment: Performed at [facility], [address], [place], [place] [address]  Ethanol     Status: None   Collection Time: 05/02/22 11:02 AM  Result Value Ref Range   Alcohol, Ethyl (B) <10 <10 mg/dL    Comment: (NOTE) Lowest detectable limit for serum alcohol is 10 mg/dL.  For medical purposes only. Performed at [facility], [address], [place], [place] [address]   Salicylate level     Status: Abnormal   Collection Time: 05/02/22 11:02  AM  Result  Value Ref Range   Salicylate Lvl <7.0 (L) 7.0 - 30.0 mg/dL    Comment: Performed at [facility], [address] [place], [place] [address]  Acetaminophen level     Status: Abnormal   Collection Time: 05/02/22 11:02 AM  Result Value Ref Range   Acetaminophen (Tylenol), Serum <10 (L) 10 - 30 ug/mL    Comment: (NOTE) Therapeutic concentrations vary significantly. A range of 10-30 ug/mL  may be an effective concentration for many patients. However, some  are best treated at concentrations outside of this range. Acetaminophen concentrations >150 ug/mL at 4 hours after ingestion  and >50 ug/mL at 12 hours after ingestion are often associated with  toxic reactions.  Performed at [facility], [address] [place], [place] [address]   cbc     Status: None   Collection Time: 05/02/22 11:02 AM  Result Value Ref Range   WBC 6.7 4.0 - 10.5 K/uL   RBC 4.30 4.22 - 5.81 MIL/uL   Hemoglobin 14.3 13.0 - 17.0 g/dL   HCT 51.7 61.6 - 07.3 %   MCV 99.1 80.0 - 100.0 fL   MCH 33.3 26.0 - 34.0 pg   MCHC 33.6 30.0 - 36.0 g/dL   RDW 71.0 62.6 - 94.8 %   Platelets 241 150 - 400 K/uL   nRBC 0.0 0.0 - 0.2 %    Comment: Performed at [facility], [address], [place], [place] [address]  Valproic acid level     Status: None   Collection Time: 05/02/22 11:02 AM  Result Value Ref Range   Valproic Acid Lvl 86 50.0 - 100.0 ug/mL    Comment: Performed at [facility], [address], [place], [place] [address]  Resp Panel by RT-PCR (Flu A&B, Covid) Anterior Nasal Swab     Status: None   Collection Time: 05/02/22  9:57 PM   Specimen: Anterior Nasal Swab  Result Value Ref Range   SARS Coronavirus 2 by RT PCR NEGATIVE NEGATIVE    Comment: (NOTE) SARS-CoV-2 target nucleic acids are NOT DETECTED.  The SARS-CoV-2 RNA is generally detectable in upper respiratory specimens during the acute phase of infection. The lowest concentration of SARS-CoV-2 viral copies this  assay can detect is 138 copies/mL. A negative result does not preclude SARS-Cov-2 infection and should not be used as the sole basis for treatment or other patient management decisions. A negative result may occur with  improper specimen collection/handling, submission of specimen other than nasopharyngeal swab, presence of viral mutation(s) within the areas targeted by this assay, and inadequate number of viral copies(<138 copies/mL). A negative result must be combined with clinical observations, patient history, and epidemiological information. The expected result is Negative.  Fact Sheet for Patients:  BloggerCourse.com  Fact Sheet for Healthcare Providers:  SeriousBroker.it  This test is no t yet approved or cleared by the [place] FDA and  has been authorized for detection and/or diagnosis of SARS-CoV-2 by FDA under an Emergency Use Authorization (EUA). This EUA will remain  in effect (meaning this test can be used) for the duration of the COVID-19 declaration under Section 564(b)(1) of the Act, 21 U.S.C.section 360bbb-3(b)(1), unless the authorization is terminated  or revoked sooner.       Influenza A by PCR NEGATIVE NEGATIVE   Influenza B by PCR NEGATIVE NEGATIVE    Comment: (NOTE) The Xpert Xpress SARS-CoV-2/FLU/RSV plus assay is intended as an aid in the diagnosis of influenza from  Nasopharyngeal swab specimens and should not be used as a sole basis for treatment. Nasal washings and aspirates are unacceptable for Xpert Xpress SARS-CoV-2/FLU/RSV testing.  Fact Sheet for Patients: BloggerCourse.com  Fact Sheet for Healthcare Providers: SeriousBroker.it  This test is not yet approved or cleared by the [place] FDA and has been authorized for detection and/or diagnosis of SARS-CoV-2 by FDA under an Emergency Use Authorization (EUA). This EUA will remain in  effect (meaning this test can be used) for the duration of the COVID-19 declaration under Section 564(b)(1) of the Act, 21 U.S.C. section 360bbb-3(b)(1), unless the authorization is terminated or revoked.  Performed at [facility], [address] [place], [place] [address]     Blood Alcohol level:  Lab Results  Component Value Date   [facility] <10 05/02/2022   ETH <10 04/23/2022    Metabolic Disorder Labs: Lab Results  Component Value Date   HGBA1C 5.7 (H) 04/04/2022   MPG 116.89 04/04/2022   No results found for: "PROLACTIN" Lab Results  Component Value Date   CHOL 161 04/24/2022   TRIG 52 04/24/2022   HDL 59 04/24/2022   CHOLHDL 2.7 04/24/2022   VLDL 10 04/24/2022   LDLCALC 92 04/24/2022   LDLCALC 122 (H) 06/23/2019    Physical Findings: AIMS:  , ,  ,  ,    CIWA:    COWS:     Musculoskeletal: Strength & Muscle Tone: within normal limits Gait & Station: normal Patient leans: N/A  Psychiatric Specialty Exam:  Presentation  General Appearance: Appropriate for Environment  Eye Contact:Good  Speech:Clear and Coherent  Speech Volume:Normal  Handedness:No data recorded  Mood and Affect  Mood:Anxious  Affect:Congruent   Thought Process  Thought Processes:Disorganized  Descriptions of Associations:Loose  Orientation:Full (Time, Place and Person)  Thought Content:Illogical  History of Schizophrenia/Schizoaffective disorder:No  Duration of Psychotic Symptoms:Greater than six months  Hallucinations:No data recorded Ideas of Reference:Paranoia  Suicidal Thoughts:No data recorded Homicidal Thoughts:No data recorded  Sensorium  Memory:Immediate Fair  Judgment:Impaired  Insight:Poor   Executive Functions  Concentration:Poor  Attention Span:Poor  Recall:Poor  Fund of Knowledge:Fair  Language:Fair   Psychomotor Activity  Psychomotor Activity:No data recorded  Assets  Assets:Desire for Improvement; Financial  Resources/Insurance; Housing; Social Support; Resilience; Physical Health   Sleep  Sleep:No data recorded   Physical Exam: Physical Exam Vitals and nursing note reviewed.  Constitutional:      Appearance: Normal appearance. He is normal weight.  Neurological:     General: No focal deficit present.     Mental Status: He is alert and oriented to person, place, and time.  Psychiatric:        Attention and Perception: Attention and perception normal.        Mood and Affect: Mood is depressed. Affect is flat.        Speech: Speech normal.        Behavior: Behavior normal. Behavior is cooperative.        Thought Content: Thought content is paranoid and delusional.        Cognition and Memory: Cognition normal.        Judgment: Judgment is inappropriate.   Review of Systems  Constitutional: Negative.   HENT: Negative.    Eyes: Negative.   Respiratory: Negative.    Cardiovascular: Negative.   Gastrointestinal: Negative.   Genitourinary: Negative.   Musculoskeletal: Negative.   Skin: Negative.   Neurological: Negative.   Endo/Heme/Allergies: Negative.   Psychiatric/Behavioral:  Positive for depression.  Blood pressure 108/76, pulse 91, temperature 98.2 F (36.8 C), resp. rate 18, height 5\' 8"  (1.727 m), weight 56.2 kg, SpO2 97 %. Body mass index is 18.85 kg/m.   Treatment Plan Summary: Daily contact with patient to assess and evaluate symptoms and progress in treatment, Medication management, and Plan continue current medications.  Start Lexapro 10 mg/day.  , DO 05/04/2022, 10:58 AM

## 2022-05-04 NOTE — Plan of Care (Signed)
D:  Patient alert and oriented.  Patient reported he slept well and had a good appetite this morning.   Patient endorses "some" anxiety and depression.  Patient denies SI/HI. Denies AVH at this time.  Denies pain.    Patient asking if he would be going home today. RN explained he would be meeting with the psychiatrist  later this morning to determine the plan of care.    A:  Medications administered as ordered.  Emotional support and encouragement offered.  Frequent verbal interaction with patient.  Q15 minute checks in place for safety.   R:  Patient compliant with medications.  No adverse reactions noted.  Patient contracts for safety.  Patient seen in dayroom interacting with peers.   Problem: Activity: Goal: Risk for activity intolerance will decrease Outcome: Progressing   Problem: Safety: Goal: Ability to remain free from injury will improve Outcome: Progressing
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
## 2022-05-04 NOTE — BH IP Treatment Plan (Signed)
Interdisciplinary Treatment and Diagnostic Plan Update  05/04/2022 Time of Session: 10:00AM Eric Hatfield. MRN: [id number]  Principal Diagnosis: Bipolar 1 disorder ([place])  Secondary Diagnoses: Principal Problem:   Bipolar 1 disorder ([place])   Current Medications:  Current Facility-Administered Medications  Medication Dose Route Frequency Provider Last Rate Last Admin   acetaminophen (TYLENOL) tablet 650 mg  650 mg Oral Q6H PRN Deloria Lair, NP       alum & mag hydroxide-simeth (MAALOX/MYLANTA) 200-200-20 MG/5ML suspension 30 mL  30 mL Oral Q4H PRN Doren Custard, Rashaun M, NP       amLODipine (NORVASC) tablet 10 mg  10 mg Oral Daily Doren Custard, Rashaun M, NP   10 mg at 05/04/22 0854   divalproex (DEPAKOTE) DR tablet 500 mg  500 mg Oral BID Deloria Lair, NP   500 mg at 05/04/22 0854   hydrOXYzine (ATARAX) tablet 25 mg  25 mg Oral TID PRN Deloria Lair, NP       irbesartan (AVAPRO) tablet 75 mg  75 mg Oral Daily Anette Riedel M, NP   75 mg at 05/04/22 0854   magnesium hydroxide (MILK OF MAGNESIA) suspension 30 mL  30 mL Oral Daily PRN Deloria Lair, NP       risperiDONE (RISPERDAL) tablet 3 mg  3 mg Oral Daily Doren Custard, Rashaun M, NP   3 mg at 05/04/22 0854   traZODone (DESYREL) tablet 50 mg  50 mg Oral QHS PRN Deloria Lair, NP       PTA Medications: Medications Prior to Admission  Medication Sig Dispense Refill Last Dose   amLODipine (NORVASC) 10 MG tablet TAKE 1 TABLET BY MOUTH DAILY (Patient taking differently: Take 10 mg by mouth daily.) 30 tablet 12    divalproex (DEPAKOTE) 500 MG DR tablet TAKE 1 TABLET BY MOUTH 2 TIMES DAILY. (Patient taking differently: Take 500 mg by mouth 2 (two) times daily.) 60 tablet 0    risperiDONE (RISPERDAL) 2 MG tablet Take 2 mg by mouth at bedtime.      risperiDONE (RISPERDAL) 3 MG tablet Take 1 tablet (3 mg total) by mouth daily. 60 tablet 0    valsartan (DIOVAN) 80 MG tablet TAKE 1 TABLET BY MOUTH DAILY. (Patient taking differently: Take 80 mg  by mouth daily.) 90 tablet 4     Patient Stressors: Financial difficulties    Patient Strengths: Capable of independent living  Armed forces logistics/support/administrative officer  Physical Health  Supportive family/friends   Treatment Modalities: Medication Management, Group therapy, Case management,  1 to 1 session with clinician, Psychoeducation, Recreational therapy.   Physician Treatment Plan for Primary Diagnosis: Bipolar 1 disorder ([place]) Long Term Goal(s): Improvement in symptoms so as ready for discharge   Short Term Goals: Ability to identify changes in lifestyle to reduce recurrence of condition will improve Ability to verbalize feelings will improve Ability to disclose and discuss suicidal ideas Ability to demonstrate self-control will improve Ability to identify and develop effective coping behaviors will improve Ability to maintain clinical measurements within normal limits will improve Compliance with prescribed medications will improve Ability to identify triggers associated with substance abuse/mental health issues will improve  Medication Management: Evaluate patient's response, side effects, and tolerance of medication regimen.  Therapeutic Interventions: 1 to 1 sessions, Unit Group sessions and Medication administration.  Evaluation of Outcomes: Not Met  Physician Treatment Plan for Secondary Diagnosis: Principal Problem:   Bipolar 1 disorder ([place])  Long Term Goal(s): Improvement in symptoms so as ready for discharge  Short Term Goals: Ability to identify changes in lifestyle to reduce recurrence of condition will improve Ability to verbalize feelings will improve Ability to disclose and discuss suicidal ideas Ability to demonstrate self-control will improve Ability to identify and develop effective coping behaviors will improve Ability to maintain clinical measurements within normal limits will improve Compliance with prescribed medications will improve Ability to identify triggers  associated with substance abuse/mental health issues will improve     Medication Management: Evaluate patient's response, side effects, and tolerance of medication regimen.  Therapeutic Interventions: 1 to 1 sessions, Unit Group sessions and Medication administration.  Evaluation of Outcomes: Not Met   RN Treatment Plan for Primary Diagnosis: Bipolar 1 disorder ([place]) Long Term Goal(s): Knowledge of disease and therapeutic regimen to maintain health will improve  Short Term Goals: Ability to demonstrate self-control, Ability to participate in decision making will improve, Ability to verbalize feelings will improve, Ability to disclose and discuss suicidal ideas, Ability to identify and develop effective coping behaviors will improve, and Compliance with prescribed medications will improve  Medication Management: RN will administer medications as ordered by provider, will assess and evaluate patient's response and provide education to patient for prescribed medication. RN will report any adverse and/or side effects to prescribing provider.  Therapeutic Interventions: 1 on 1 counseling sessions, Psychoeducation, Medication administration, Evaluate responses to treatment, Monitor vital signs and CBGs as ordered, Perform/monitor CIWA, COWS, AIMS and Fall Risk screenings as ordered, Perform wound care treatments as ordered.  Evaluation of Outcomes: Not Met   LCSW Treatment Plan for Primary Diagnosis: Bipolar 1 disorder ([place]) Long Term Goal(s): Safe transition to appropriate next level of care at discharge, Engage patient in therapeutic group addressing interpersonal concerns.  Short Term Goals: Engage patient in aftercare planning with referrals and resources, Increase social support, Increase ability to appropriately verbalize feelings, Increase emotional regulation, Facilitate acceptance of mental health diagnosis and concerns, and Increase skills for wellness and recovery  Therapeutic  Interventions: Assess for all discharge needs, 1 to 1 time with Social worker, Explore available resources and support systems, Assess for adequacy in community support network, Educate family and significant other(s) on suicide prevention, Complete Psychosocial Assessment, Interpersonal group therapy.  Evaluation of Outcomes: Not Met   Progress in Treatment: Attending groups: No. Participating in groups: No. Taking medication as prescribed: Yes. Toleration medication: Yes. Family/Significant other contact made: No, will contact:  once permission is given.  Patient understands diagnosis: Yes. Discussing patient identified problems/goals with staff: Yes. Medical problems stabilized or resolved: Yes. Denies suicidal/homicidal ideation: Yes. Issues/concerns per patient self-inventory: No. Other: none  New problem(s) identified: No, Describe:  none  New Short Term/Long Term Goal(s):  elimination of symptoms of psychosis, medication management for mood stabilization; elimination of SI thoughts; development of comprehensive mental wellness plan.   Patient Goals:  "get out of here"  Discharge Plan or Barriers: CSW to assist patient in development of appropriate plans. Initial plans developed with the patient are for patient to continue with Dr. Nicolasa Ducking, his mental health provider, and to return home.  Reason for Continuation of Hospitalization: Anxiety Depression Hallucinations Medication stabilization  Estimated Length of Stay:  1-7 days  Last 3 [place] Suicide Severity Risk Score: Flowsheet Row Admission (Current) from 05/02/2022 in [facility] Most recent reading at 05/04/2022  9:16 AM ED from 05/02/2022 in [facility] Most recent reading at 05/02/2022 11:03 AM Admission (Discharged) from 04/23/2022 in [facility] Most recent reading at  04/25/2022  5:00 AM  C-SSRS RISK CATEGORY No Risk No Risk No Risk        Last PHQ 2/9 Scores:    09/19/2021    4:06 PM 04/07/2021    4:50 PM 12/27/2020    2:55 PM  Depression screen PHQ 2/9  Decreased Interest 2 0 0  Down, Depressed, Hopeless 0 1 0  PHQ - 2 Score 2 1 0  Altered sleeping 1 0 0  Tired, decreased energy 1 1 0  Change in appetite 0 0 0  Feeling bad or failure about yourself  0 0 0  Trouble concentrating 1 0 0  Moving slowly or fidgety/restless 1 0 0  Suicidal thoughts 0 0 0  PHQ-9 Score 6 2 0  Difficult doing work/chores Not difficult at all  Not difficult at all    Scribe for Treatment Team: Rozann Lesches, LCSW 05/04/2022 11:20 AM

## 2022-05-05 DIAGNOSIS — F319 Bipolar disorder, unspecified: Secondary | ICD-10-CM | POA: Diagnosis not present

## 2022-05-05 NOTE — Progress Notes (Signed)
[facility] MD Progress Note  05/05/2022 12:24 PM Eric Hatfield.  MRN:  [id number] Subjective: Eric Hatfield is seen on rounds.  I started him on Lexapro yesterday and he is tolerating it.  We discussed it because he does not want to do ECT.  He has not been on an antidepressant so we went ahead and started it.  He is okay with that.  He continues to do pretty well on Depakote and Risperdal.  He has been pleasant and cooperative on the unit and compliant with his medicine.  Denies any side effects.  We talked about discharge by the end of the week.  Principal Problem: Bipolar 1 disorder (HCC) Diagnosis: Principal Problem:   Bipolar 1 disorder (HCC)  Total Time spent with patient: 15 minutes  Past Psychiatric History:  Patient has a historical diagnosis of bipolar disorder going back years.  He had a hospitalization earlier in his life but for many years was stable on carbamazepine.  A little over a year ago that was transiently decreased which may have been the trigger for him starting down the path of worsening hallucinations.  Since then increasing the dose of the Tegretol, and multiple antipsychotics have been tried without any remission of the hallucinations.  He has no past history of suicide attempts or violence.  He is currently on Risperdal 3 mg a day divided and Depakote 500 mg twice a day. In the past saw Dr. Elna Breslow, and now sees [name].  Past Medical History:  Past Medical History:  Diagnosis Date   Bipolar disorder (HCC)    Hypertension     Past Surgical History:  Procedure Laterality Date   Impacted tooth     Family History:  Family History  Problem Relation Age of Onset   Hypertension Mother    Hyperlipidemia Father    Hypertension Brother    Mental illness Paternal Grandmother     Social History:  Social History   Substance and Sexual Activity  Alcohol Use Not Currently   Comment: quit drking in his 71s.      Social History   Substance and Sexual Activity  Drug Use Not  Currently    Social History   Socioeconomic History   Marital status: Married    Spouse name: Lauri Till   Number of children: Not on file   Years of education: Not on file   Highest education level: Not on file  Occupational History   Not on file  Tobacco Use   Smoking status: Every Day    Packs/day: 0.75    Years: 30.00    Total pack years: 22.50    Types: Cigarettes    Start date: 05/31/1975   Smokeless tobacco: Never   Tobacco comments:    started smoking as a teenager average 1 ppd  Vaping Use   Vaping Use: Never used  Substance and Sexual Activity   Alcohol use: Not Currently    Comment: quit drking in his 65s.    Drug use: Not Currently   Sexual activity: Yes    Partners: Female    Birth control/protection: None  Other Topics Concern   Not on file  Social History Narrative   Not on file   Social Determinants of Health   Financial Resource Strain: Not on file  Food Insecurity: Not on file  Transportation Needs: Not on file  Physical Activity: Not on file  Stress: Not on file  Social Connections: Not on file   Additional Social History:  Sleep: Good  Appetite:  Good  Current Medications: Current Facility-Administered Medications  Medication Dose Route Frequency Provider Last Rate Last Admin   acetaminophen (TYLENOL) tablet 650 mg  650 mg Oral Q6H PRN Dixon, Rashaun M, NP       alum & mag hydroxide-simeth (MAALOX/MYLANTA) 200-200-20 MG/5ML suspension 30 mL  30 mL Oral Q4H PRN Dixon, Rashaun M, NP       amLODipine (NORVASC) tablet 10 mg  10 mg Oral Daily Dixon, Rashaun M, NP   10 mg at 05/05/22 0818   divalproex (DEPAKOTE) DR tablet 500 mg  500 mg Oral BID Durwin [place], Rashaun M, NP   500 mg at 05/05/22 0818   escitalopram (LEXAPRO) tablet 10 mg  10 mg Oral QPC breakfast Sarina Ill, DO   10 mg at 05/05/22 0818   hydrOXYzine (ATARAX) tablet 25 mg  25 mg Oral TID PRN Jearld Lesch, NP       irbesartan  (AVAPRO) tablet 75 mg  75 mg Oral Daily Dixon, Rashaun M, NP   75 mg at 05/05/22 0818   magnesium hydroxide (MILK OF MAGNESIA) suspension 30 mL  30 mL Oral Daily PRN Jearld Lesch, NP       risperiDONE (RISPERDAL) tablet 3 mg  3 mg Oral Daily Dixon, Rashaun M, NP   3 mg at 05/05/22 0818   traZODone (DESYREL) tablet 50 mg  50 mg Oral QHS PRN Jearld Lesch, NP        Lab Results: No results found for this or any previous visit (from the past 48 hour(s)).  Blood Alcohol level:  Lab Results  Component Value Date   ETH <10 05/02/2022   ETH <10 04/23/2022    Metabolic Disorder Labs: Lab Results  Component Value Date   HGBA1C 5.7 (H) 04/04/2022   MPG 116.89 04/04/2022   No results found for: "PROLACTIN" Lab Results  Component Value Date   CHOL 161 04/24/2022   TRIG 52 04/24/2022   HDL 59 04/24/2022   CHOLHDL 2.7 04/24/2022   VLDL 10 04/24/2022   LDLCALC 92 04/24/2022   LDLCALC 122 (H) 06/23/2019    Physical Findings: AIMS:  , ,  ,  ,    CIWA:    COWS:     Musculoskeletal: Strength & Muscle Tone: within normal limits Gait & Station: normal Patient leans: N/A  Psychiatric Specialty Exam:  Presentation  General Appearance: Appropriate for Environment  Eye Contact:Good  Speech:Clear and Coherent  Speech Volume:Normal  Handedness:No data recorded  Mood and Affect  Mood:Anxious  Affect:Congruent   Thought Process  Thought Processes:Disorganized  Descriptions of Associations:Loose  Orientation:Full (Time, Place and Person)  Thought Content:Illogical  History of Schizophrenia/Schizoaffective disorder:No  Duration of Psychotic Symptoms:Greater than six months  Hallucinations:No data recorded Ideas of Reference:Paranoia  Suicidal Thoughts:No data recorded Homicidal Thoughts:No data recorded  Sensorium  Memory:Immediate Fair  Judgment:Impaired  Insight:Poor   Executive Functions  Concentration:Poor  Attention  Span:Poor  Recall:Poor  Fund of Knowledge:Fair  Language:Fair   Psychomotor Activity  Psychomotor Activity:No data recorded  Assets  Assets:Desire for Improvement; Financial Resources/Insurance; Housing; Social Support; Resilience; Physical Health   Sleep  Sleep:No data recorded   Physical Exam: Physical Exam Vitals and nursing note reviewed.  Constitutional:      Appearance: Normal appearance. He is normal weight.  Neurological:     General: No focal deficit present.     Mental Status: He is alert and oriented to person, place, and time.  Psychiatric:  Attention and Perception: Attention and perception normal.        Mood and Affect: Mood is depressed. Affect is flat.        Speech: Speech normal.        Behavior: Behavior normal. Behavior is cooperative.        Thought Content: Thought content normal.        Cognition and Memory: Cognition and memory normal.        Judgment: Judgment normal.    Review of Systems  Constitutional: Negative.   HENT: Negative.    Eyes: Negative.   Respiratory: Negative.    Cardiovascular: Negative.   Gastrointestinal: Negative.   Genitourinary: Negative.   Musculoskeletal: Negative.   Skin: Negative.   Neurological: Negative.   Endo/Heme/Allergies: Negative.   Psychiatric/Behavioral:  Positive for depression.    Blood pressure 113/78, pulse 69, temperature 98.1 F (36.7 C), temperature source Oral, resp. rate 18, height 5\' 8"  (1.727 m), weight 56.2 kg, SpO2 99 %. Body mass index is 18.85 kg/m.   Treatment Plan Summary: Daily contact with patient to assess and evaluate symptoms and progress in treatment, Medication management, and Plan continue current medications.  , DO 05/05/2022, 12:24 PM

## 2022-05-05 NOTE — BH Assessment (Signed)
1910 Received patient sitting in the day room visiting with his wife. Prior to the wife leaving she had some question regarding medication times. She expressed that she thought the patient took his medication to early in the evening (5pm) and that he was often awake in the early morning hours. Wife was informed that the patient would receive medication instruction sheets that includes medication education, side effects and suggested times to take his medication. She stated that she understood what was being told to her and she would welcome the education to have on hand to place where she and the patient could see.  2005 Patient BP is low 89/62 he was given  24 oz of Gatorade and 12 oz of water to increase his volume. Will re-check BP in an hour.

## 2022-05-05 NOTE — Plan of Care (Signed)
   D:  Patient alert and oriented.  Patient reported he slept well.   Patient has a  good appetite this morning.  When asked about anxiety / depression patient seemed confused and asked, "What do you think?"   Patient denies SI/HI and AVH.  Denies pain.    A:  Medications administered as ordered.  Emotional support and encouragement offered.  Frequent verbal interaction with patient.  Q15 minute checks in place for safety.   R:  Patient compliant with medications.  No adverse reactions noted.  Patient contracts for safety. Patient spent the morning and afternoon in the dayroom. Patient remains safe on the unit.   Problem: Nutrition: Goal: Adequate nutrition will be maintained Outcome: Progressing   Problem: Coping: Goal: Level of anxiety will decrease Outcome: Progressing   Problem: Safety: Goal: Ability to remain free from injury will improve Outcome: Progressing

## 2022-05-06 DIAGNOSIS — F319 Bipolar disorder, unspecified: Secondary | ICD-10-CM | POA: Diagnosis not present

## 2022-05-06 NOTE — Progress Notes (Signed)
[facility] MD Progress Note  05/06/2022 1:07 PM Eric A Mayo Ao.  MRN:  [id number] Subjective: Eric Hatfield is seen on rounds.  He states that he slept well.  We recently started him on Lexapro because he does not want ECT and he complains about being depressed.  He has been compliant with his medications which include Depakote and Risperdal.  So far he is tolerating everything.  He denies any suicidal ideation.  We discussed discharge on Friday and if need be Monday.  Principal Problem: Bipolar 1 disorder (HCC) Diagnosis: Principal Problem:   Bipolar 1 disorder (HCC)  Total Time spent with patient: 15 minutes  Past Psychiatric History:  Patient has a historical diagnosis of bipolar disorder going back years.  He had a hospitalization earlier in his life but for many years was stable on carbamazepine.  A little over a year ago that was transiently decreased which may have been the trigger for him starting down the path of worsening hallucinations.  Since then increasing the dose of the Tegretol, and multiple antipsychotics have been tried without any remission of the hallucinations.  He has no past history of suicide attempts or violence.  He is currently on Risperdal 3 mg a day divided and Depakote 500 mg twice a day. In the past saw Dr. Elna Breslow, and now sees [name].  Past Medical History:  Past Medical History:  Diagnosis Date   Bipolar disorder (HCC)    Hypertension     Past Surgical History:  Procedure Laterality Date   Impacted tooth     Family History:  Family History  Problem Relation Age of Onset   Hypertension Mother    Hyperlipidemia Father    Hypertension Brother    Mental illness Paternal Grandmother     Social History:  Social History   Substance and Sexual Activity  Alcohol Use Not Currently   Comment: quit drking in his 84s.      Social History   Substance and Sexual Activity  Drug Use Not Currently    Social History   Socioeconomic History   Marital status:  Married    Spouse name: Mahmoud Blazejewski   Number of children: Not on file   Years of education: Not on file   Highest education level: Not on file  Occupational History   Not on file  Tobacco Use   Smoking status: Every Day    Packs/day: 0.75    Years: 30.00    Total pack years: 22.50    Types: Cigarettes    Start date: 05/31/1975   Smokeless tobacco: Never   Tobacco comments:    started smoking as a teenager average 1 ppd  Vaping Use   Vaping Use: Never used  Substance and Sexual Activity   Alcohol use: Not Currently    Comment: quit drking in his 43s.    Drug use: Not Currently   Sexual activity: Yes    Partners: Female    Birth control/protection: None  Other Topics Concern   Not on file  Social History Narrative   Not on file   Social Determinants of Health   Financial Resource Strain: Not on file  Food Insecurity: Not on file  Transportation Needs: Not on file  Physical Activity: Not on file  Stress: Not on file  Social Connections: Not on file   Additional Social History:  Sleep: Good  Appetite:  Good  Current Medications: Current Facility-Administered Medications  Medication Dose Route Frequency Provider Last Rate Last Admin   acetaminophen (TYLENOL) tablet 650 mg  650 mg Oral Q6H PRN Dixon, Rashaun M, NP       alum & mag hydroxide-simeth (MAALOX/MYLANTA) 200-200-20 MG/5ML suspension 30 mL  30 mL Oral Q4H PRN Dixon, Rashaun M, NP       amLODipine (NORVASC) tablet 10 mg  10 mg Oral Daily Dixon, Rashaun M, NP   10 mg at 05/06/22 0823   divalproex (DEPAKOTE) DR tablet 500 mg  500 mg Oral BID Lerry Liner M, NP   500 mg at 05/06/22 0823   escitalopram (LEXAPRO) tablet 10 mg  10 mg Oral QPC breakfast Sarina Ill, DO   10 mg at 05/06/22 0932   hydrOXYzine (ATARAX) tablet 25 mg  25 mg Oral TID PRN Jearld Lesch, NP       irbesartan (AVAPRO) tablet 75 mg  75 mg Oral Daily Dixon, Rashaun M, NP   75 mg at  05/05/22 0818   magnesium hydroxide (MILK OF MAGNESIA) suspension 30 mL  30 mL Oral Daily PRN Jearld Lesch, NP       risperiDONE (RISPERDAL) tablet 3 mg  3 mg Oral Daily Dixon, Rashaun M, NP   3 mg at 05/06/22 3557   traZODone (DESYREL) tablet 50 mg  50 mg Oral QHS PRN Jearld Lesch, NP        Lab Results: No results found for this or any previous visit (from the past 48 hour(s)).  Blood Alcohol level:  Lab Results  Component Value Date   ETH <10 05/02/2022   ETH <10 04/23/2022    Metabolic Disorder Labs: Lab Results  Component Value Date   HGBA1C 5.7 (H) 04/04/2022   MPG 116.89 04/04/2022   No results found for: "PROLACTIN" Lab Results  Component Value Date   CHOL 161 04/24/2022   TRIG 52 04/24/2022   HDL 59 04/24/2022   CHOLHDL 2.7 04/24/2022   VLDL 10 04/24/2022   LDLCALC 92 04/24/2022   LDLCALC 122 (H) 06/23/2019    Physical Findings: AIMS:  , ,  ,  ,    CIWA:    COWS:     Musculoskeletal: Strength & Muscle Tone: within normal limits Gait & Station: normal Patient leans: N/A  Psychiatric Specialty Exam:  Presentation  General Appearance: Appropriate for Environment  Eye Contact:Good  Speech:Clear and Coherent  Speech Volume:Normal  Handedness:No data recorded  Mood and Affect  Mood:Anxious  Affect:Congruent   Thought Process  Thought Processes:Disorganized  Descriptions of Associations:Loose  Orientation:Full (Time, Place and Person)  Thought Content:Illogical  History of Schizophrenia/Schizoaffective disorder:No  Duration of Psychotic Symptoms:Greater than six months  Hallucinations:No data recorded Ideas of Reference:Paranoia  Suicidal Thoughts:No data recorded Homicidal Thoughts:No data recorded  Sensorium  Memory:Immediate Fair  Judgment:Impaired  Insight:Poor   Executive Functions  Concentration:Poor  Attention Span:Poor  Recall:Poor  Fund of Knowledge:Fair  Language:Fair   Psychomotor Activity   Psychomotor Activity:No data recorded  Assets  Assets:Desire for Improvement; Financial Resources/Insurance; Housing; Social Support; Resilience; Physical Health   Sleep  Sleep:No data recorded   Physical Exam: Physical Exam Vitals and nursing note reviewed.  Constitutional:      Appearance: Normal appearance. He is normal weight.  Neurological:     General: No focal deficit present.     Mental Status: He is alert and oriented to person, place, and time.  Psychiatric:  Attention and Perception: Attention and perception normal.        Mood and Affect: Mood is depressed. Affect is flat.        Speech: Speech normal.        Behavior: Behavior normal. Behavior is cooperative.        Thought Content: Thought content normal.        Cognition and Memory: Cognition and memory normal.        Judgment: Judgment normal.    Review of Systems  Constitutional: Negative.   HENT: Negative.    Eyes: Negative.   Respiratory: Negative.    Cardiovascular: Negative.   Gastrointestinal: Negative.   Genitourinary: Negative.   Musculoskeletal: Negative.   Skin: Negative.   Neurological: Negative.   Endo/Heme/Allergies: Negative.   Psychiatric/Behavioral:  Positive for depression.    Blood pressure 112/68, pulse 70, temperature 98 F (36.7 C), temperature source Oral, resp. rate 18, height 5\' 8"  (1.727 m), weight 56.2 kg, SpO2 98 %. Body mass index is 18.85 kg/m.   Treatment Plan Summary: Daily contact with patient to assess and evaluate symptoms and progress in treatment, Medication management, and Plan continue current medications.  We recently started Lexapro.  , DO 05/06/2022, 1:07 PM

## 2022-05-06 NOTE — Group Note (Signed)
[facility] LCSW Group Therapy Note   Group Date: 05/06/2022 Start Time: 1400 End Time: 1500   Type of Therapy and Topic: Group Therapy: Avoiding Self-Sabotaging and Enabling Behaviors  Participation Level: Active  Mood: Euthymic   Description of Group:  In this group, patients will learn how to identify obstacles, self-sabotaging and enabling behaviors, as well as: what are they, why do we do them and what needs these behaviors meet. Discuss unhealthy relationships and how to have positive healthy boundaries with those that sabotage and enable. Explore aspects of self-sabotage and enabling in yourself and how to limit these self-destructive behaviors in everyday life.   Therapeutic Goals: 1. Patient will identify one obstacle that relates to self-sabotage and enabling behaviors 2. Patient will identify one personal self-sabotaging or enabling behavior they did prior to admission 3. Patient will state a plan to change the above identified behavior 4. Patient will demonstrate ability to communicate their needs through discussion and/or role play.    Summary of Patient Progress: Patient was present for the entirety of the group session. Patient was an active listener and participated in the topic of discussion, provided helpful advice to others, and added nuance to topic of conversation. Patient stated he enjoys talking with others in the community. Presents as open and pleasant with others on the unit.    Therapeutic Modalities:  Cognitive Behavioral Therapy Person-Centered Therapy Motivational Interviewing    Corky Crafts, [place]

## 2022-05-06 NOTE — Progress Notes (Signed)
Patient is alert and oriented times 3. Mood and affect appropriate. Patient denies pain. He denies SI, HI, and AVH. Also denies feelings of anxiety and depression at this time. States he slept good last night. Morning meds given whole by mouth W/O difficulty. Ate breakfast in day room- appetite good. Patient remains on unit with Q15 minute checks in place.  

## 2022-05-07 DIAGNOSIS — F319 Bipolar disorder, unspecified: Secondary | ICD-10-CM | POA: Diagnosis not present

## 2022-05-07 NOTE — Progress Notes (Signed)
[facility] MD Progress Note  05/07/2022 11:26 AM Eric Hatfield.  MRN:  [id number] Subjective: Eric Hatfield is taking his medication as prescribed and denies any side effects.  He states that he slept pretty well.  He still feels depressed but denies any suicidal thinking.  No issues with the medications and no side effects.  I told him the antidepressant takes a while to work.  He is still okay with discharge tomorrow.  I think he will do well if he stays on his medication.  Principal Problem: Bipolar 1 disorder (HCC) Diagnosis: Principal Problem:   Bipolar 1 disorder (HCC)  Total Time spent with patient: 15 minutes  Past Psychiatric History:  Patient has a historical diagnosis of bipolar disorder going back years.  He had a hospitalization earlier in his life but for many years was stable on carbamazepine.  A little over a year ago that was transiently decreased which may have been the trigger for him starting down the path of worsening hallucinations.  Since then increasing the dose of the Tegretol, and multiple antipsychotics have been tried without any remission of the hallucinations.  He has no past history of suicide attempts or violence.  He is currently on Risperdal 3 mg a day divided and Depakote 500 mg twice a day. In the past saw Dr. Elna Breslow, and now sees [name].  Past Medical History:  Past Medical History:  Diagnosis Date   Bipolar disorder (HCC)    Hypertension     Past Surgical History:  Procedure Laterality Date   Impacted tooth     Family History:  Family History  Problem Relation Age of Onset   Hypertension Mother    Hyperlipidemia Father    Hypertension Brother    Mental illness Paternal Grandmother     Social History:  Social History   Substance and Sexual Activity  Alcohol Use Not Currently   Comment: quit drking in his 42s.      Social History   Substance and Sexual Activity  Drug Use Not Currently    Social History   Socioeconomic History   Marital status:  Married    Spouse name: Locklan Canoy   Number of children: Not on file   Years of education: Not on file   Highest education level: Not on file  Occupational History   Not on file  Tobacco Use   Smoking status: Every Day    Packs/day: 0.75    Years: 30.00    Total pack years: 22.50    Types: Cigarettes    Start date: 05/31/1975   Smokeless tobacco: Never   Tobacco comments:    started smoking as a teenager average 1 ppd  Vaping Use   Vaping Use: Never used  Substance and Sexual Activity   Alcohol use: Not Currently    Comment: quit drking in his 68s.    Drug use: Not Currently   Sexual activity: Yes    Partners: Female    Birth control/protection: None  Other Topics Concern   Not on file  Social History Narrative   Not on file   Social Determinants of Health   Financial Resource Strain: Not on file  Food Insecurity: Not on file  Transportation Needs: Not on file  Physical Activity: Not on file  Stress: Not on file  Social Connections: Not on file   Additional Social History:  Sleep: Good  Appetite:  Good  Current Medications: Current Facility-Administered Medications  Medication Dose Route Frequency Provider Last Rate Last Admin   acetaminophen (TYLENOL) tablet 650 mg  650 mg Oral Q6H PRN Dixon, Rashaun M, NP       alum & mag hydroxide-simeth (MAALOX/MYLANTA) 200-200-20 MG/5ML suspension 30 mL  30 mL Oral Q4H PRN Dixon, Rashaun M, NP       amLODipine (NORVASC) tablet 10 mg  10 mg Oral Daily Dixon, Rashaun M, NP   10 mg at 05/07/22 0934   divalproex (DEPAKOTE) DR tablet 500 mg  500 mg Oral BID Lerry Liner M, NP   500 mg at 05/07/22 0935   escitalopram (LEXAPRO) tablet 10 mg  10 mg Oral QPC breakfast Sarina Ill, DO   10 mg at 05/07/22 1610   hydrOXYzine (ATARAX) tablet 25 mg  25 mg Oral TID PRN Jearld Lesch, NP       irbesartan (AVAPRO) tablet 75 mg  75 mg Oral Daily Dixon, Rashaun M, NP   75 mg at  05/07/22 9604   magnesium hydroxide (MILK OF MAGNESIA) suspension 30 mL  30 mL Oral Daily PRN Jearld Lesch, NP       risperiDONE (RISPERDAL) tablet 3 mg  3 mg Oral Daily Dixon, Rashaun M, NP   3 mg at 05/07/22 0936   traZODone (DESYREL) tablet 50 mg  50 mg Oral QHS PRN Jearld Lesch, NP        Lab Results: No results found for this or any previous visit (from the past 48 hour(s)).  Blood Alcohol level:  Lab Results  Component Value Date   ETH <10 05/02/2022   ETH <10 04/23/2022    Metabolic Disorder Labs: Lab Results  Component Value Date   HGBA1C 5.7 (H) 04/04/2022   MPG 116.89 04/04/2022   No results found for: "PROLACTIN" Lab Results  Component Value Date   CHOL 161 04/24/2022   TRIG 52 04/24/2022   HDL 59 04/24/2022   CHOLHDL 2.7 04/24/2022   VLDL 10 04/24/2022   LDLCALC 92 04/24/2022   LDLCALC 122 (H) 06/23/2019    Physical Findings: AIMS:  , ,  ,  ,    CIWA:    COWS:     Musculoskeletal: Strength & Muscle Tone: within normal limits Gait & Station: normal Patient leans: N/A  Psychiatric Specialty Exam:  Presentation  General Appearance: Appropriate for Environment  Eye Contact:Good  Speech:Clear and Coherent  Speech Volume:Normal  Handedness:No data recorded  Mood and Affect  Mood:Anxious  Affect:Congruent   Thought Process  Thought Processes:Disorganized  Descriptions of Associations:Loose  Orientation:Full (Time, Place and Person)  Thought Content:Illogical  History of Schizophrenia/Schizoaffective disorder:No  Duration of Psychotic Symptoms:Greater than six months  Hallucinations:No data recorded Ideas of Reference:Paranoia  Suicidal Thoughts:No data recorded Homicidal Thoughts:No data recorded  Sensorium  Memory:Immediate Fair  Judgment:Impaired  Insight:Poor   Executive Functions  Concentration:Poor  Attention Span:Poor  Recall:Poor  Fund of Knowledge:Fair  Language:Fair   Psychomotor Activity   Psychomotor Activity:No data recorded  Assets  Assets:Desire for Improvement; Financial Resources/Insurance; Housing; Social Support; Resilience; Physical Health   Sleep  Sleep:No data recorded   Physical Exam: Physical Exam Vitals and nursing note reviewed.  Constitutional:      Appearance: Normal appearance. He is normal weight.  Neurological:     General: No focal deficit present.     Mental Status: He is alert and oriented to person, place, and time.  Psychiatric:  Attention and Perception: Attention and perception normal.        Mood and Affect: Mood is depressed. Affect is flat.        Speech: Speech normal.        Behavior: Behavior normal. Behavior is cooperative.        Thought Content: Thought content normal.        Cognition and Memory: Cognition and memory normal.        Judgment: Judgment normal.    Review of Systems  Constitutional: Negative.   HENT: Negative.    Eyes: Negative.   Respiratory: Negative.    Cardiovascular: Negative.   Gastrointestinal: Negative.   Genitourinary: Negative.   Musculoskeletal: Negative.   Skin: Negative.   Neurological: Negative.   Endo/Heme/Allergies: Negative.   Psychiatric/Behavioral:  Positive for depression.    Blood pressure 128/70, pulse 71, temperature 98.2 F (36.8 C), temperature source Oral, resp. rate 18, height 5\' 8"  (1.727 m), weight 56.2 kg, SpO2 98 %. Body mass index is 18.85 kg/m.   Treatment Plan Summary: Daily contact with patient to assess and evaluate symptoms and progress in treatment, Medication management, and Plan continue current medications.  Plan on discharge tomorrow.  , DO 05/07/2022, 11:26 AM

## 2022-05-07 NOTE — Progress Notes (Signed)
Pt seen in his room for AM meds. He appeared slightly anxious but cooperative with assessment. He rated his depression 6/10 and anxiety 6/10. He denies SI/HI/AVH. He was intermittently out in the mileu interacting with peers.

## 2022-05-07 NOTE — BH Assessment (Signed)
1910 Received patient sitting in the day room visiting with his wife. He is engaged in conversation with her.   2035 After wife leaves patient is seen interacting with peers during snack time. He denies SI/HI, A/V hallucinations,  depression, and pain. He rates his mood at a 7.   1030 Patient is compliant with medication schedule. He is in bed shortly after medications.  Will continue to monitor patient for safety.  0600 Patient has remained in his bed through the shift; will continue to monitor patient for safety.

## 2022-05-07 NOTE — Group Note (Signed)

## 2022-05-08 DIAGNOSIS — F319 Bipolar disorder, unspecified: Secondary | ICD-10-CM | POA: Diagnosis not present

## 2022-05-08 MED ORDER — ESCITALOPRAM OXALATE 10 MG PO TABS: 10.0000 mg | ORAL_TABLET | Freq: Every day | ORAL | 3 refills | Status: DC

## 2022-05-08 MED ORDER — RISPERIDONE 3 MG PO TABS
3.0000 mg | ORAL_TABLET | Freq: Every day | ORAL | 3 refills | Status: DC
Start: 2022-05-08 — End: 2024-08-11

## 2022-05-08 MED ORDER — DIVALPROEX SODIUM 500 MG PO DR TAB
500.0000 mg | DELAYED_RELEASE_TABLET | Freq: Two times a day (BID) | ORAL | 3 refills | Status: DC
Start: 2022-05-08 — End: 2024-08-11

## 2022-05-08 MED ORDER — IRBESARTAN 75 MG PO TABS: 75.0000 mg | ORAL_TABLET | Freq: Every day | ORAL | 3 refills | Status: DC

## 2022-05-08 NOTE — Progress Notes (Signed)
Patient ID: Eric Hatfield., male   DOB: 1955/11/03, 66 y.o.   MRN: [id number]  Discharge Note:  Patient denies SI/HI/AVH at this time. Discharge instructions, AVS, prescriptions, and transition record gone over with patient. Patient agrees to comply with medication management, follow-up visit, and outpatient therapy. Patient belongings were kept at bedside, there were no belongings in his locker. Patient questions and concerns addressed and answered. Patient wheeled off unit. Patient discharged to home with his wife.

## 2022-05-08 NOTE — [facility] Suicide Risk Assessment (Signed)
[facility] INPATIENT:  Family/Significant Other Suicide Prevention Education  Suicide Prevention Education:  Contact Attempts: Goebel Hellums, wife, [phone] has been identified by the patient as the family member/significant other with whom the patient will be residing, and identified as the person(s) who will aid the patient in the event of a mental health crisis.  With written consent from the patient, two attempts were made to provide suicide prevention education, prior to and/or following the patient's discharge.  We were unsuccessful in providing suicide prevention education.  A suicide education pamphlet was given to the patient to share with family/significant other.   Date and time of first attempt:05/04/2022 at 11:25 AM Date and time of second attempt: 05/08/2022  at 10:06 AM .  Unable to leave a message, voicemail full or not setup.  Corky Crafts 05/08/2022, 10:05 AM
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
## 2022-05-08 NOTE — Progress Notes (Signed)
D- Patient alert and oriented. Patient presents in a pleasant mood on assessment stating that he slept "alright" last night and had no complaints to voice to this Clinical research associate. Patient denies both depression and anxiety , saying "no, not too bad. I'm supposed to go home today. "Patient also denies SI, HI, AVH, and pain at this time. Patient had no stated goals for today.  A- Scheduled medications administered to patient, per MD orders. Support and encouragement provided.  Routine safety checks conducted every 15 minutes.  Patient informed to notify staff with problems or concerns.  R- No adverse drug reactions noted. Patient contracts for safety at this time. Patient compliant with medications and treatment plan. Patient receptive, calm, and cooperative. Patient interacts well with others on the unit.  Patient remains safe at this time.

## 2022-05-08 NOTE — Progress Notes (Signed)
   05/08/22 0200  Psych Admission Type (Psych Patients Only)  Admission Status Voluntary  Psychosocial Assessment  Patient Complaints Anxiety;Depression  Eye Contact Fair  Facial Expression Anxious  Affect Anxious  Speech Logical/coherent  Interaction Assertive  Motor Activity Slow  Appearance/Hygiene In scrubs  Behavior Characteristics Cooperative;Anxious  Mood Anxious;Preoccupied  Thought Process  Coherency WDL  Content WDL  Delusions None reported or observed  Perception WDL  Hallucination None reported or observed  Judgment Impaired  Confusion None  Danger to Self  Current suicidal ideation? Denies  Danger to Others  Danger to Others None reported or observed

## 2022-05-08 NOTE — BHH Suicide Risk Assessment (Signed)
[facility] Discharge Suicide Risk Assessment   Principal Problem: Bipolar 1 disorder (HCC) Discharge Diagnoses: Principal Problem:   Bipolar 1 disorder (HCC)   Total Time spent with patient: 1 hour  Musculoskeletal: Strength & Muscle Tone: within normal limits Gait & Station: normal Patient leans: N/A  Psychiatric Specialty Exam  Presentation  General Appearance: Appropriate for Environment  Eye Contact:Good  Speech:Clear and Coherent  Speech Volume:Normal  Handedness:No data recorded  Mood and Affect  Mood:Anxious  Duration of Depression Symptoms: Greater than two weeks  Affect:Congruent   Thought Process  Thought Processes:Disorganized  Descriptions of Associations:Loose  Orientation:Full (Time, Place and Person)  Thought Content:Illogical  History of Schizophrenia/Schizoaffective disorder:No  Duration of Psychotic Symptoms:Greater than six months  Hallucinations:No data recorded Ideas of Reference:Paranoia  Suicidal Thoughts:No data recorded Homicidal Thoughts:No data recorded  Sensorium  Memory:Immediate Fair  Judgment:Impaired  Insight:Poor   Executive Functions  Concentration:Poor  Attention Span:Poor  Recall:Poor  Fund of Knowledge:Fair  Language:Fair   Psychomotor Activity  Psychomotor Activity:No data recorded  Assets  Assets:Desire for Improvement; Financial Resources/Insurance; Housing; Social Support; Resilience; Physical Health   Sleep  Sleep:No data recorded  Physical Exam: Physical Exam ROS Blood pressure 118/85, pulse 71, temperature 98.4 F (36.9 C), temperature source Oral, resp. rate 18, height 5\' 8"  (1.727 m), weight 56.2 kg, SpO2 97 %. Body mass index is 18.85 kg/m.  Mental Status Per Nursing Assessment::   On Admission:  NA  Demographic Factors:  Male, Age 26 or older, and Caucasian  Loss Factors: NA  Historical Factors: NA  Risk Reduction Factors:   Living with another person, especially a  relative, Positive social support, and Positive therapeutic relationship  Continued Clinical Symptoms:  Bipolar Disorder:   Mixed State  Cognitive Features That Contribute To Risk:  Polarized thinking    Suicide Risk:  Minimal: No identifiable suicidal ideation.  Patients presenting with no risk factors but with morbid ruminations; may be classified as minimal risk based on the severity of the depressive symptoms   Follow-up Information     Dr. 76. Go on 05/15/2022.   Why: Please present for your scheduled appointment on Friday 8/25 1000am. Contact information: [address] [place], [place]                Plan Of Care/Follow-up recommendations: Dr. [id number], DO 05/08/2022, 9:27 AM

## 2022-05-08 NOTE — Progress Notes (Signed)
  [facility] Adult Case Management Discharge Plan :  Will you be returning to the same living situation after discharge:  Yes,  Patient to return to place of residence with wife  At discharge, do you have transportation home?: Yes,  Patient's spouse to assist with transportation.  Do you have the ability to pay for your medications: Yes,  [employer]   Release of information consent forms completed and in the chart;  Patient's signature needed at discharge.  Patient to Follow up at:  Follow-up Information     Dr. Caryn Section. Go on 05/15/2022.   Why: Please present for your scheduled appointment on Friday 8/25 1000am. Contact information: [address] [place], [place] [address]                Next level of care provider has access to [facility] Link:no  Safety Planning and Suicide Prevention discussed: Yes,  SPE completed with patient, CSW unable to reach spouse after multiple attempts.  CSW completed SPE with patient. Discussed potential triggers leading to suicidal ideation in addition to coping skills one might use in order to delay and distract self from self harming behaviors. CSW encouraged patient to utilize emergency services if they felt unable to maintain their safety. SPE flyer provided to patient at this time.    Has patient been referred to the Quitline?: Patient refused referral Tobacco Use: High Risk (05/03/2022)   Patient History    Smoking Tobacco Use: Every Day    Smokeless Tobacco Use: Never    Passive Exposure: Not on file   Patient has been referred for addiction treatment: N/A Patient denies active substance use, UDS Negative for all, screened low risk during nursing admission (see [facility] quick tab).  Social History   Substance and Sexual Activity  Alcohol Use Not Currently   Comment: quit drking in his 67s.    Social History   Substance and Sexual Activity  Drug Use Not Currently    Almedia Balls 05/08/2022, 10:06 AM

## 2022-06-11 ENCOUNTER — Encounter: Payer: Self-pay | Admitting: Acute Care

## 2022-08-04 ENCOUNTER — Ambulatory Visit (INDEPENDENT_AMBULATORY_CARE_PROVIDER_SITE_OTHER): Payer: PPO | Admitting: Family Medicine

## 2022-08-04 ENCOUNTER — Encounter: Payer: Self-pay | Admitting: Family Medicine

## 2022-08-04 VITALS — BP 114/81 | HR 87 | Resp 16 | Wt 151.0 lb

## 2022-08-04 DIAGNOSIS — H60501 Unspecified acute noninfective otitis externa, right ear: Secondary | ICD-10-CM | POA: Diagnosis not present

## 2022-08-04 MED ORDER — CIPROFLOXACIN-DEXAMETHASONE 0.3-0.1 % OT SUSP: 4.0000 [drp] | Freq: Two times a day (BID) | OTIC | 0 refills | Status: AC

## 2022-08-04 NOTE — Progress Notes (Signed)
    SUBJECTIVE:   CHIEF COMPLAINT / HPI:   EAR COMPLAINT Duration: 1 week Involved ear(s): right Quality: achy Fever: no Otorrhea:  unsure Upper respiratory infection symptoms: a little congested x1 month Pruritus: no Hearing loss: yes Water immersion no Using Q-tips: yes, removed wax the other day, got worse.  Recurrent otitis media: no Treatments attempted: debrox, peroxide drops   OBJECTIVE:   BP 114/81 (BP Location: Right Arm, Patient Position: Sitting, Cuff Size: Normal)   Pulse 87   Resp 16   Wt 151 lb (68.5 kg)   SpO2 98%   BMI 22.96 kg/m   Gen: well appearing, in NAD HEENT: L TM visible b/l without bulging, erythema, purulence. R TM not visible due to moderate amount of purluence and debris in external canal. Erythema and irritation present in external canal.    ASSESSMENT/PLAN:   Otitis externa Irrigation provided today. Rx ciprodex drops x7 days. F/u prn.    Caro Laroche, DO

## 2022-08-17 ENCOUNTER — Ambulatory Visit: Payer: Self-pay | Admitting: *Deleted

## 2022-08-17 NOTE — Telephone Encounter (Signed)
Message from [employer] sent at 08/17/2022  9:39 AM EST  Summary: rx concern   The patient's wife would like to speak with a member of clinical staff about their ciprofloxacin-dexamethasone (CIPRODEX) OTIC suspension [[id number]] prescription  The patient was directed to take the medication for 7 days but shares that the medication has helped with discomfort but not their hearing  The patient would like speak with a member of clinical staff when possible about their medication  Please contact further when possible          Call History   Type Contact Phone/Fax User  08/17/2022 09:37 AM EST Phone (Incoming) [employer] (Emergency Contact) [phone] Gaetana Michaelis A   Reason for Disposition  [1] Caller has NON-URGENT medicine question about med that PCP prescribed AND [2] triager unable to answer question  Answer Assessment - Initial Assessment Questions 1. NAME of MEDICINE: "What medicine(s) are you calling about?"     Ciprodex OTIC suspension.  Husband in background answering questions.  Wife on phone with me.  Can't hear good out of the right ear.   It has an infection in it.   Should he keep putting the drops in his ear?  He has done it for 7 days.  Wife Vitor Overbaugh had called in.   He was seen by Dr. Linwood Dibbles on 08/04/2022 and prescribed ear drops for an infection in his right ear.  Has appt. At 3:45  on Thur. For follow up.   2. QUESTION: "What is your question?" (e.g., double dose of medicine, side effect)     He can hear but it has fullness in it.   It's muffled sounding.    3. PRESCRIBER: "Who prescribed the medicine?" Reason: if prescribed by specialist, call should be referred to that group.     Dr. Linwood Dibbles 4. SYMPTOMS: "Do you have any symptoms?" If Yes, ask: "What symptoms are you having?"  "How bad are the symptoms (e.g., mild, moderate, severe)     Still having fullness in his right ear after using the drops for 7 days.  It's muffled sounding.    Denies any pain in right ear. 5. PREGNANCY:  "Is there any chance that you are pregnant?" "When was your last menstrual period?"    N/A  Protocols used: Medication Question Call-A-AH

## 2022-08-17 NOTE — Telephone Encounter (Signed)
Please advise message below  °

## 2022-08-17 NOTE — Telephone Encounter (Signed)
  Chief Complaint: Right ear is still muffled sounding and has fullness in it after using the Ciprodex OTIC suspension for 7 days as directed.   Seen on 08/04/2022 by Dr. Linwood Dibbles for ear infection. Symptoms: Fullness and muffled sounding in right ear after 7 days of the ear drops Frequency: N/A Pertinent Negatives: Patient denies pain in right ear. Disposition: [] ED /[] Urgent Care (no appt availability in office) / [] Appointment(In office/virtual)/ []  [facility]/ [] Home Care/ [] Refused Recommended Disposition /[] [facility]/ [x]  Follow-up with PCP Additional Notes: Message sent to [facility] for Dr. .

## 2022-08-18 NOTE — Progress Notes (Deleted)
      Established patient visit   Patient: Eric Hatfield.   DOB: 15-Nov-1955   66 y.o. Male  MRN: [id number] Visit Date: 08/20/2022  Today's healthcare provider: Jacky Kindle, FNP   No chief complaint on file.  Subjective    HPI  ***  Medications: Outpatient Medications Prior to Visit  Medication Sig   divalproex (DEPAKOTE) 500 MG DR tablet Take 1 tablet (500 mg total) by mouth 2 (two) times daily.   escitalopram (LEXAPRO) 10 MG tablet Take 1 tablet (10 mg total) by mouth daily after breakfast.   irbesartan (AVAPRO) 75 MG tablet Take 1 tablet (75 mg total) by mouth daily.   risperiDONE (RISPERDAL) 3 MG tablet Take 1 tablet (3 mg total) by mouth daily.   No facility-administered medications prior to visit.    Review of Systems  {Labs  Heme  Chem  Endocrine  Serology  Results Review (optional):23779}   Objective    There were no vitals taken for this visit. {Show previous vital signs (optional):23777}  Physical Exam  ***  No results found for any visits on 08/20/22.  Assessment & Plan     ***  No follow-ups on file.      {provider attestation***:1}   Jacky Kindle, FNP  [facility] [phone] (phone) [phone] (fax)  [facility]

## 2022-08-18 NOTE — Telephone Encounter (Signed)
Need to return to visualize ear canal and ear drum

## 2022-08-19 ENCOUNTER — Encounter: Payer: Self-pay | Admitting: Physician Assistant

## 2022-08-19 ENCOUNTER — Ambulatory Visit (INDEPENDENT_AMBULATORY_CARE_PROVIDER_SITE_OTHER): Payer: PPO | Admitting: Physician Assistant

## 2022-08-19 VITALS — BP 119/77 | HR 86 | Temp 97.6°F | Wt 155.9 lb

## 2022-08-19 DIAGNOSIS — H60501 Unspecified acute noninfective otitis externa, right ear: Secondary | ICD-10-CM

## 2022-08-19 DIAGNOSIS — H9191 Unspecified hearing loss, right ear: Secondary | ICD-10-CM

## 2022-08-19 MED ORDER — FLUTICASONE PROPIONATE 50 MCG/ACT NA SUSP: 1.0000 | Freq: Every day | NASAL | 0 refills | Status: DC

## 2022-08-19 NOTE — Progress Notes (Signed)
Established patient visit   Patient: Eric Hatfield.   DOB: 1956-04-13   66 y.o. Male  MRN: [id number] Visit Date: 08/19/2022  Today's healthcare provider: Alfredia Ferguson, PA-C   Cc. Hearing loss  Subjective     Pt presents with continue right ear ringing and fullness x 3 weeks. Reports using the ear drops for a week with only slight improvement. He still feels his hearing is less on the right. Denies pain. Reports associated nasal congestion.    Medications: Outpatient Medications Prior to Visit  Medication Sig   divalproex (DEPAKOTE) 500 MG DR tablet Take 1 tablet (500 mg total) by mouth 2 (two) times daily.   escitalopram (LEXAPRO) 10 MG tablet Take 1 tablet (10 mg total) by mouth daily after breakfast.   irbesartan (AVAPRO) 75 MG tablet Take 1 tablet (75 mg total) by mouth daily.   risperiDONE (RISPERDAL) 3 MG tablet Take 1 tablet (3 mg total) by mouth daily.   No facility-administered medications prior to visit.    Review of Systems  Constitutional:  Negative for fatigue and fever.  HENT:  Positive for hearing loss.   Respiratory:  Negative for cough and shortness of breath.   Cardiovascular:  Negative for chest pain, palpitations and leg swelling.  Neurological:  Negative for dizziness and headaches.   }   Objective    Blood pressure 119/77, pulse 86, temperature 97.6 F (36.4 C), temperature source Oral, weight 155 lb 14.4 oz (70.7 kg), SpO2 99 %.   Physical Exam Vitals reviewed.  Constitutional:      Appearance: He is not ill-appearing.  HENT:     Head: Normocephalic.     Right Ear: Tympanic membrane normal.     Left Ear: Tympanic membrane normal.     Ears:     Comments: R ear canal with small amount of exudates, no wax impaction no abnormalities with TM Eyes:     Conjunctiva/sclera: Conjunctivae normal.  Cardiovascular:     Rate and Rhythm: Normal rate.  Pulmonary:     Effort: Pulmonary effort is normal. No respiratory distress.   Neurological:     General: No focal deficit present.     Mental Status: He is alert and oriented to person, place, and time.  Psychiatric:        Mood and Affect: Mood normal.        Behavior: Behavior normal.     No results found for any visits on 08/19/22.  Assessment & Plan     Problem List Items Addressed This Visit       Nervous and Auditory   Acute otitis externa of right ear - Primary    Nearly cleared, recommended an additional 3 days of ear drops.        Hearing loss of right ear    Otitis externa at this point unlikely cause of hearing loss Recommended starting flonase nasal spray d/t associated congestion If no improvement in 2 weeks would refer to ent      Relevant Medications   fluticasone (FLONASE) 50 MCG/ACT nasal spray     Return if symptoms worsen or fail to improve.      I, Alfredia Ferguson, PA-C have reviewed all documentation for this visit. The documentation on  08/19/2022  for the exam, diagnosis, procedures, and orders are all accurate and complete.  Alfredia Ferguson, PA-C [facility] [address] [place], [place], [address] Office: [phone] Fax: [phone]   [facility]

## 2022-08-19 NOTE — Assessment & Plan Note (Signed)
Nearly cleared, recommended an additional 3 days of ear drops.

## 2022-08-19 NOTE — Assessment & Plan Note (Signed)
Otitis externa at this point unlikely cause of hearing loss Recommended starting flonase nasal spray d/t associated congestion If no improvement in 2 weeks would refer to ent

## 2022-08-20 ENCOUNTER — Ambulatory Visit: Payer: PPO | Admitting: Family Medicine

## 2022-09-01 ENCOUNTER — Telehealth: Payer: Self-pay | Admitting: Family Medicine

## 2022-09-01 NOTE — Telephone Encounter (Signed)
Requested medications are due for refill today.  yes  Requested medications are on the active medications list.  yes  Last refill. 05/08/2022 #30 3rf  Future visit scheduled.   yes  Notes to clinic.  Rx signed by Elane Fritz    Requested Prescriptions  Pending Prescriptions Disp Refills   irbesartan (AVAPRO) 75 MG tablet [Pharmacy Med Name: IRBESARTAN 75 MG TAB] 30 tablet 3    Sig: TAKE 1 TABLET BY MOUTH DAILY.     Cardiovascular:  Angiotensin Receptor Blockers Passed - 09/01/2022  4:47 PM      Passed - Cr in normal range and within 180 days    Creatinine, Ser  Date Value Ref Range Status  05/02/2022 0.96 0.61 - 1.24 mg/dL Final         Passed - K in normal range and within 180 days    Potassium  Date Value Ref Range Status  05/02/2022 4.6 3.5 - 5.1 mmol/L Final         Passed - Patient is not pregnant      Passed - Last BP in normal range    BP Readings from Last 1 Encounters:  08/19/22 119/77         Passed - Valid encounter within last 6 months    Recent Outpatient Visits           1 week ago Acute otitis externa of right ear, unspecified type   [facility] [place], [place], PA-C   4 weeks ago Acute otitis externa of right ear, unspecified type   [facility] Caro Laroche, DO   5 months ago Essential hypertension   [facility] Malva Limes, MD   6 months ago Hyponatremia   [facility] Alfredia Ferguson, PA-C   11 months ago Essential hypertension   [facility] Fisher, Demetrios Isaacs, MD       Future Appointments             In 3 weeks Fisher, Demetrios Isaacs, MD [facility]

## 2022-09-02 MED ORDER — IRBESARTAN 75 MG PO TABS: 75.0000 mg | ORAL_TABLET | Freq: Every day | ORAL | 3 refills | Status: DC

## 2022-09-02 NOTE — Telephone Encounter (Signed)
Wife following up on refill request for  irbesartan (AVAPRO) 75 MG tablet 90 days  [facility] - [place], [place] - [address]   Pt has one tab for tomorrow

## 2022-09-02 NOTE — Addendum Note (Signed)
Addended by: Malva Limes on: 09/02/2022 01:12 PM   Modules accepted: Orders

## 2022-09-02 NOTE — Telephone Encounter (Signed)
Patient's wife called in , says pt only has 1 pill left

## 2022-09-28 ENCOUNTER — Encounter: Payer: Self-pay | Admitting: Family Medicine

## 2022-09-28 ENCOUNTER — Ambulatory Visit (INDEPENDENT_AMBULATORY_CARE_PROVIDER_SITE_OTHER): Payer: PPO | Admitting: Family Medicine

## 2022-09-28 VITALS — BP 112/82 | HR 80 | Wt 158.9 lb

## 2022-09-28 DIAGNOSIS — Z23 Encounter for immunization: Secondary | ICD-10-CM

## 2022-09-28 DIAGNOSIS — I10 Essential (primary) hypertension: Secondary | ICD-10-CM

## 2022-09-28 NOTE — Progress Notes (Signed)
I,Eric Hatfield,acting as a Neurosurgeon for Eric Merry, MD.,have documented all relevant documentation on the behalf of Eric Merry, MD,as directed by  Eric Merry, MD while in the presence of Eric Merry, MD.   Established patient visit   Patient: Eric Hatfield.   DOB: 04/07/1956   67 y.o. Male  MRN: [id number] Visit Date: 09/28/2022  Today's healthcare provider: Mila Merry, MD   Chief Complaint  Patient presents with   Hypertension   Subjective    HPI  Hypertension, follow-up  BP Readings from Last 3 Encounters:  08/19/22 119/77  08/04/22 114/81  05/02/22 114/71   Wt Readings from Last 3 Encounters:  08/19/22 155 lb 14.4 oz (70.7 kg)  08/04/22 151 lb (68.5 kg)  05/02/22 125 lb (56.7 kg)     He was last seen for hypertension 6 months ago.  BP at that visit was 120/84. Management since that visit includes continue current treatment.  Use of agents associated with hypertension: none.   Outside blood pressures are not being checked. Symptoms: No chest pain No chest pressure  No palpitations No syncope  No dyspnea No orthopnea  No paroxysmal nocturnal dyspnea No lower extremity edema   Pertinent labs Lab Results  Component Value Date   CHOL 161 04/24/2022   HDL 59 04/24/2022   LDLCALC 92 04/24/2022   TRIG 52 04/24/2022   CHOLHDL 2.7 04/24/2022   Lab Results  Component Value Date   NA 135 05/02/2022   K 4.6 05/02/2022   CREATININE 0.96 05/02/2022   GFRNONAA >60 05/02/2022   GLUCOSE 112 (H) 05/02/2022   TSH 0.842 04/03/2022     The 10-year ASCVD risk score (Arnett DK, et al., 2019) is: 14.1%  ---------------------------------------------------------------------------------------------------   Medications: Outpatient Medications Prior to Visit  Medication Sig   divalproex (DEPAKOTE) 500 MG DR tablet Take 1 tablet (500 mg total) by mouth 2 (two) times daily.   escitalopram (LEXAPRO) 10 MG tablet Take 1 tablet (10 mg total) by mouth daily  after breakfast.   fluticasone (FLONASE) 50 MCG/ACT nasal spray Place 1 spray into both nostrils daily.   irbesartan (AVAPRO) 75 MG tablet Take 1 tablet (75 mg total) by mouth daily.   risperiDONE (RISPERDAL) 3 MG tablet Take 1 tablet (3 mg total) by mouth daily.   No facility-administered medications prior to visit.    Review of Systems  Constitutional:  Negative for appetite change, chills and fever.  Respiratory:  Negative for chest tightness, shortness of breath and wheezing.   Cardiovascular:  Negative for chest pain and palpitations.  Gastrointestinal:  Negative for abdominal pain, nausea and vomiting.       Objective    BP 112/82 (BP Location: Left Arm, Patient Position: Sitting, Cuff Size: Normal)   Pulse 80   Wt 158 lb 14.4 oz (72.1 kg)   SpO2 98%   BMI 24.16 kg/m    Physical Exam    General: Appearance:    Well developed, well nourished male in no acute distress  Eyes:    PERRL, conjunctiva/corneas clear, EOM's intact       Lungs:     Clear to auscultation bilaterally, respirations unlabored  Heart:    Normal heart rate. Normal rhythm.  3/6 systolic murmur at right upper sternal border   MS:   All extremities are intact.    Neurologic:   Awake, alert, oriented x 3. No apparent focal neurological defect.         Assessment & Plan  1. Primary hypertension Well controlled.  Continue current medications.     Flu vaccine given today      The entirety of the information documented in the History of Present Illness, Review of Systems and Physical Exam were personally obtained by me. Portions of this information were initially documented by the CMA and reviewed by me for thoroughness and accuracy.     Eric Merry, MD  [facility] [phone] (phone) [phone] (fax)  [facility]

## 2022-09-30 ENCOUNTER — Encounter: Payer: Self-pay | Admitting: *Deleted

## 2022-12-23 ENCOUNTER — Telehealth: Payer: Self-pay | Admitting: Family Medicine

## 2022-12-23 NOTE — Telephone Encounter (Signed)
Called patient to schedule Medicare Annual Wellness Visit (AWV). Unable to reach patient.  Last date of AWV: 11/21/2021  Please schedule an appointment at any time with [facility].  If any questions, please contact me at [phone].  Thank you ,  [place] Direct Dial: [phone]

## 2022-12-28 ENCOUNTER — Telehealth: Payer: Self-pay | Admitting: Family Medicine

## 2022-12-28 NOTE — Telephone Encounter (Signed)
Contacted Eric Hatfield. to schedule their annual wellness visit. Appointment made for 01/04/2023.  [facility] Care [facility] AWV TEAM Direct Dial: [phone]

## 2023-01-04 ENCOUNTER — Telehealth: Payer: Self-pay

## 2023-01-04 ENCOUNTER — Ambulatory Visit (INDEPENDENT_AMBULATORY_CARE_PROVIDER_SITE_OTHER): Payer: PPO

## 2023-01-04 VITALS — Ht 68.0 in | Wt 158.0 lb

## 2023-01-04 DIAGNOSIS — Z0000 Encounter for general adult medical examination without abnormal findings: Secondary | ICD-10-CM | POA: Diagnosis not present

## 2023-01-04 NOTE — Telephone Encounter (Signed)
Pt called back to do AWV

## 2023-01-04 NOTE — Progress Notes (Signed)
I connected with  Eric Hatfield. on 01/04/23 by a audio enabled telemedicine application and verified that I am speaking with the correct person using two identifiers.  Patient Location: Home  Provider Location: Office/Clinic  I discussed the limitations of evaluation and management by telemedicine. The patient expressed understanding and agreed to proceed.  Subjective:   Eric Hatfield. is a 67 y.o. male who presents for Medicare Annual/Subsequent preventive examination.  Review of Systems    Cardiac Risk Factors include: advanced age (>44men, >76 women);hypertension;sedentary lifestyle;smoking/ tobacco exposure;male gender     Objective:    Today's Vitals   01/04/23 1054  Weight: 158 lb (71.7 kg)  Height: 5\' 8"  (1.727 m)   Body mass index is 24.02 kg/m.     01/04/2023   11:03 AM 05/02/2022   11:02 AM 04/24/2022    5:45 AM 04/23/2022    1:36 PM 04/03/2022    6:04 PM 05/17/2021    3:33 PM 05/02/2021    6:23 AM  Advanced Directives  Does Patient Have a Medical Advance Directive? No No  No No No   Would patient like information on creating a medical advance directive?  No - Patient declined  No - Patient declined No - Patient declined  No - Patient declined     Information is confidential and restricted. Go to Review Flowsheets to unlock data.    Current Medications (verified) Outpatient Encounter Medications as of 01/04/2023  Medication Sig   divalproex (DEPAKOTE) 500 MG DR tablet Take 1 tablet (500 mg total) by mouth 2 (two) times daily.   escitalopram (LEXAPRO) 10 MG tablet Take 1 tablet (10 mg total) by mouth daily after breakfast.   fluticasone (FLONASE) 50 MCG/ACT nasal spray Place 1 spray into both nostrils daily.   irbesartan (AVAPRO) 75 MG tablet Take 1 tablet (75 mg total) by mouth daily.   risperiDONE (RISPERDAL) 3 MG tablet Take 1 tablet (3 mg total) by mouth daily.   No facility-administered encounter medications on file as of 01/04/2023.    Allergies  (verified) Patient has no known allergies.   History: Past Medical History:  Diagnosis Date   Bipolar disorder    Hypertension    Past Surgical History:  Procedure Laterality Date   Impacted tooth     Family History  Problem Relation Age of Onset   Hypertension Mother    Hyperlipidemia Father    Hypertension Brother    Mental illness Paternal Grandmother    Social History   Socioeconomic History   Marital status: Married    Spouse name: Moeez Yamamoto   Number of children: Not on file   Years of education: Not on file   Highest education level: Not on file  Occupational History   Not on file  Tobacco Use   Smoking status: Every Day    Packs/day: 0.75    Years: 30.00    Additional pack years: 0.00    Total pack years: 22.50    Types: Cigarettes    Start date: 05/31/1975   Smokeless tobacco: Never   Tobacco comments:    started smoking as a teenager average 1 ppd  Vaping Use   Vaping Use: Never used  Substance and Sexual Activity   Alcohol use: Not Currently    Comment: quit drking in his 72s.    Drug use: Not Currently   Sexual activity: Yes    Partners: Female    Birth control/protection: None  Other Topics Concern  Not on file  Social History Narrative   Not on file   Social Determinants of Health   Financial Resource Strain: Low Risk  (01/04/2023)   Overall Financial Resource Strain (CARDIA)    Difficulty of Paying Living Expenses: Not hard at all  Food Insecurity: No Food Insecurity (01/04/2023)   Hunger Vital Sign    Worried About Running Out of Food in the Last Year: Never true    Ran Out of Food in the Last Year: Never true  Transportation Needs: No Transportation Needs (01/04/2023)   PRAPARE - Administrator, Civil Service (Medical): No    Lack of Transportation (Non-Medical): No  Physical Activity: Inactive (01/04/2023)   Exercise Vital Sign    Days of Exercise per Week: 0 days    Minutes of Exercise per Session: 0 min   Stress: No Stress Concern Present (01/04/2023)   [employer] of Occupational Health - Occupational Stress Questionnaire    Feeling of Stress : Not at all  Social Connections: Moderately Integrated (01/04/2023)   Social Connection and Isolation Panel [NHANES]    Frequency of Communication with Friends and Family: Twice a week    Frequency of Social Gatherings with Friends and Family: Once a week    Attends Religious Services: 1 to 4 times per year    Active Member of [employer] or Organizations: No    Attends Engineer, structural: Never    Marital Status: Married    Tobacco Counseling Ready to quit: Not Answered Counseling given: Not Answered Tobacco comments: started smoking as a teenager average 1 ppd   Clinical Intake:  Pre-visit preparation completed: Yes  Pain : No/denies pain     BMI - recorded: 24.02 Nutritional Status: BMI of 19-24  Normal Nutritional Risks: None Diabetes: No  How often do you need to have someone help you when you read instructions, pamphlets, or other written materials from your doctor or pharmacy?: 1 - Never  Diabetic?no  Interpreter Needed?: No  Comments: lives with his wife Information entered by :: B.Raheel Kunkle,LPN Activities of Daily Living    01/04/2023   11:03 AM 08/04/2022   11:26 AM  In your present state of health, do you have any difficulty performing the following activities:  Hearing? 0 1  Vision? 1 0  Comment wears glasses   Difficulty concentrating or making decisions? 0 0  Walking or climbing stairs? 0 0  Dressing or bathing? 0 0  Doing errands, shopping? 0 1  Preparing Food and eating ? N   Using the Toilet? N   In the past six months, have you accidently leaked urine? N   Do you have problems with loss of bowel control? N   Managing your Medications? N   Managing your Finances? N   Housekeeping or managing your Housekeeping? N     Patient Care Team: Malva Limes, MD as PCP - General (Family  Medicine) Kerin Salen, MD as Consulting Physician (Psychiatry)  Indicate any recent Medical Services you may have received from other than [facility] providers in the past year (date may be approximate).     Assessment:   This is a routine wellness examination for [name].  Hearing/Vision screen Hearing Screening - Comments:: Adequate hearing Vision Screening - Comments:: Adequate vision w/glasses [facility]  Dietary issues and exercise activities discussed: Current Exercise Habits: The patient does not participate in regular exercise at present   Goals Addressed   None    Depression Screen  01/04/2023   10:57 AM 08/04/2022   11:25 AM 09/19/2021    4:06 PM 04/07/2021    4:50 PM 12/27/2020    2:55 PM 12/10/2020    3:09 PM 05/20/2018    9:45 AM  PHQ 2/9 Scores  PHQ - 2 Score 0  0  PHQ- 9 Score 0  0     Information is confidential and restricted. Go to Review Flowsheets to unlock data.    Fall Risk    01/04/2023   10:56 AM 08/04/2022   11:25 AM 09/19/2021    4:06 PM 12/27/2020    2:55 PM  Fall Risk   Falls in the past year? 0 0 0 0  Number falls in past yr: 0 0 0 0  Injury with Fall? 0 0 0 0  Risk for fall due to : No Fall Risks No Fall Risks No Fall Risks   Follow up Education provided;Falls prevention discussed Falls evaluation completed  Falls evaluation completed    FALL RISK PREVENTION PERTAINING TO THE HOME:  Any stairs in or around the home? Yes  If so, are there any without handrails? Yes  Home free of loose throw rugs in walkways, pet beds, electrical cords, etc? Yes  Adequate lighting in your home to reduce risk of falls? Yes   ASSISTIVE DEVICES UTILIZED TO PREVENT FALLS:  Life alert? No  Use of a cane, walker or w/c? No  Grab bars in the bathroom? No  Shower chair or bench in shower? No  Elevated toilet seat or a handicapped toilet? Yes    Cognitive Function:        01/04/2023   11:05 AM  6CIT Screen  What Year? 0 points  What  month? 0 points  What time? 0 points  Count back from 20 0 points  Months in reverse 0 points  Repeat phrase 0 points  Total Score 0 points    Immunizations Immunization History  Administered Date(s) Administered   Fluad Quad(high Dose 65+) 06/28/2020, 09/28/2022   Influenza,inj,Quad PF,6+ Mos 05/20/2018, 06/23/2019   Td 02/19/2017   Tdap 12/21/2006    TDAP status: Up to date  Flu Vaccine status: Up to date  Pneumococcal vaccine status: Declined,  Education has been provided regarding the importance of this vaccine but patient still declined. Advised may receive this vaccine at local pharmacy or Health Dept. Aware to provide a copy of the vaccination record if obtained from local pharmacy or Health Dept. Verbalized acceptance and understanding.   Covid-19 vaccine status: Declined, Education has been provided regarding the importance of this vaccine but patient still declined. Advised may receive this vaccine at local pharmacy or Health Dept.or vaccine clinic. Aware to provide a copy of the vaccination record if obtained from local pharmacy or Health Dept. Verbalized acceptance and understanding.  Qualifies for Shingles Vaccine? Yes   Zostavax completed No   Shingrix Completed?: No.    Education has been provided regarding the importance of this vaccine. Patient has been advised to call insurance company to determine out of pocket expense if they have not yet received this vaccine. Advised may also receive vaccine at local pharmacy or Health Dept. Verbalized acceptance and understanding.  Screening Tests Health Maintenance  Topic Date Due   COVID-19 Vaccine (1) Never done   Pneumonia Vaccine 20+ Years old (1 of 2 - PCV) Never done   Zoster Vaccines- Shingrix (1 of 2) Never done   COLONOSCOPY (Pts 45-73yrs  Insurance coverage will need to be confirmed)  Never done   Lung Cancer Screening  10/30/2022   INFLUENZA VACCINE  04/22/2023   Medicare Annual Wellness (AWV)  01/04/2024    DTaP/Tdap/Td (3 - Td or Tdap) 02/20/2027   Hepatitis C Screening  Completed   HIV Screening  Completed   HPV VACCINES  Aged Out    Health Maintenance  Health Maintenance Due  Topic Date Due   COVID-19 Vaccine (1) Never done   Pneumonia Vaccine 3+ Years old (1 of 2 - PCV) Never done   Zoster Vaccines- Shingrix (1 of 2) Never done   COLONOSCOPY (Pts 45-69yrs Insurance coverage will need to be confirmed)  Never done   Lung Cancer Screening  10/30/2022    Colorectal cancer screening: Type of screening: Colonoscopy. Completed no. Repeat every 5-10 years Pt to discuss with PCP  Lung Cancer Screening: (Low Dose CT Chest recommended if Age 73-80 years, 30 pack-year currently smoking OR have quit w/in 15years.) does qualify.   Lung Cancer Screening Referral: no pt declines  Additional Screening:  Hepatitis C Screening: does not qualify; Completed yes  Vision Screening: Recommended annual ophthalmology exams for early detection of glaucoma and other disorders of the eye. Is the patient up to date with their annual eye exam?  Yes  Who is the provider or what is the name of the office in which the patient attends annual eye exams? [facility] If pt is not established with a provider, would they like to be referred to a provider to establish care? No .   Dental Screening: Recommended annual dental exams for proper oral hygiene  Community Resource Referral / Chronic Care Management: CRR required this visit?  No   CCM required this visit?  No      Plan:     I have personally reviewed and noted the following in the patient's chart:   Medical and social history Use of alcohol, tobacco or illicit drugs  Current medications and supplements including opioid prescriptions. Patient is not currently taking opioid prescriptions. Functional ability and status Nutritional status Physical activity Advanced directives List of other physicians Hospitalizations, surgeries, and ER visits in  previous 12 months Vitals Screenings to include cognitive, depression, and falls Referrals and appointments  In addition, I have reviewed and discussed with patient certain preventive protocols, quality metrics, and best practice recommendations. A written personalized care plan for preventive services as well as general preventive health recommendations were provided to patient.     Sue Lush, LPN   1/61/0960   Nurse Notes: The patient states he is doing well and has no concerns or questions at this time.

## 2023-01-04 NOTE — Patient Instructions (Signed)
Eric Hatfield , Thank you for taking time to come for your Medicare Wellness Visit. I appreciate your ongoing commitment to your health goals. Please review the following plan we discussed and let me know if I can assist you in the future.   These are the goals we discussed:  Goals   None     This is a list of the screening recommended for you and due dates:  Health Maintenance  Topic Date Due   COVID-19 Vaccine (1) Never done   Pneumonia Vaccine (1 of 2 - PCV) Never done   Zoster (Shingles) Vaccine (1 of 2) Never done   Colon Cancer Screening  Never done   Screening for Lung Cancer  10/30/2022   Flu Shot  04/22/2023   Medicare Annual Wellness Visit  01/04/2024   DTaP/Tdap/Td vaccine (3 - Td or Tdap) 02/20/2027   Hepatitis C Screening: USPSTF Recommendation to screen - Ages 42-79 yo.  Completed   HIV Screening  Completed   HPV Vaccine  Aged Out    Advanced directives: no  Conditions/risks identified: none  Next appointment: Follow up in one year for your annual wellness visit. 01/05/2024 @ 9:45am telephone  Preventive Care 65 Years and Older, Male  Preventive care refers to lifestyle choices and visits with your health care provider that can promote health and wellness. What does preventive care include? A yearly physical exam. This is also called an annual well check. Dental exams once or twice a year. Routine eye exams. Ask your health care provider how often you should have your eyes checked. Personal lifestyle choices, including: Daily care of your teeth and gums. Regular physical activity. Eating a healthy diet. Avoiding tobacco and drug use. Limiting alcohol use. Practicing safe sex. Taking low doses of aspirin every day. Taking vitamin and mineral supplements as recommended by your health care provider. What happens during an annual well check? The services and screenings done by your health care provider during your annual well check will depend on your age,  overall health, lifestyle risk factors, and family history of disease. Counseling  Your health care provider may ask you questions about your: Alcohol use. Tobacco use. Drug use. Emotional well-being. Home and relationship well-being. Sexual activity. Eating habits. History of falls. Memory and ability to understand (cognition). Work and work Astronomer. Screening  You may have the following tests or measurements: Height, weight, and BMI. Blood pressure. Lipid and cholesterol levels. These may be checked every 5 years, or more frequently if you are over 64 years old. Skin check. Lung cancer screening. You may have this screening every year starting at age 59 if you have a 30-pack-year history of smoking and currently smoke or have quit within the past 15 years. Fecal occult blood test (FOBT) of the stool. You may have this test every year starting at age 42. Flexible sigmoidoscopy or colonoscopy. You may have a sigmoidoscopy every 5 years or a colonoscopy every 10 years starting at age 58. Prostate cancer screening. Recommendations will vary depending on your family history and other risks. Hepatitis C blood test. Hepatitis B blood test. Sexually transmitted disease (STD) testing. Diabetes screening. This is done by checking your blood sugar (glucose) after you have not eaten for a while (fasting). You may have this done every 1-3 years. Abdominal aortic aneurysm (AAA) screening. You may need this if you are a current or former smoker. Osteoporosis. You may be screened starting at age 42 if you are at high risk. Talk with  your health care provider about your test results, treatment options, and if necessary, the need for more tests. Vaccines  Your health care provider may recommend certain vaccines, such as: Influenza vaccine. This is recommended every year. Tetanus, diphtheria, and acellular pertussis (Tdap, Td) vaccine. You may need a Td booster every 10 years. Zoster vaccine.  You may need this after age 74. Pneumococcal 13-valent conjugate (PCV13) vaccine. One dose is recommended after age 43. Pneumococcal polysaccharide (PPSV23) vaccine. One dose is recommended after age 71. Talk to your health care provider about which screenings and vaccines you need and how often you need them. This information is not intended to replace advice given to you by your health care provider. Make sure you discuss any questions you have with your health care provider. Document Released: 10/04/2015 Document Revised: 05/27/2016 Document Reviewed: 07/09/2015 Elsevier Interactive Patient Education  2017 [employer] Prevention in the Home Falls can cause injuries. They can happen to people of all ages. There are many things you can do to make your home safe and to help prevent falls. What can I do on the outside of my home? Regularly fix the edges of walkways and driveways and fix any cracks. Remove anything that might make you trip as you walk through a door, such as a raised step or threshold. Trim any bushes or trees on the path to your home. Use bright outdoor lighting. Clear any walking paths of anything that might make someone trip, such as rocks or tools. Regularly check to see if handrails are loose or broken. Make sure that both sides of any steps have handrails. Any raised decks and porches should have guardrails on the edges. Have any leaves, snow, or ice cleared regularly. Use sand or salt on walking paths during winter. Clean up any spills in your garage right away. This includes oil or grease spills. What can I do in the bathroom? Use night lights. Install grab bars by the toilet and in the tub and shower. Do not use towel bars as grab bars. Use non-skid mats or decals in the tub or shower. If you need to sit down in the shower, use a plastic, non-slip stool. Keep the floor dry. Clean up any water that spills on the floor as soon as it happens. Remove soap  buildup in the tub or shower regularly. Attach bath mats securely with double-sided non-slip rug tape. Do not have throw rugs and other things on the floor that can make you trip. What can I do in the bedroom? Use night lights. Make sure that you have a light by your bed that is easy to reach. Do not use any sheets or blankets that are too big for your bed. They should not hang down onto the floor. Have a firm chair that has side arms. You can use this for support while you get dressed. Do not have throw rugs and other things on the floor that can make you trip. What can I do in the kitchen? Clean up any spills right away. Avoid walking on wet floors. Keep items that you use a lot in easy-to-reach places. If you need to reach something above you, use a strong step stool that has a grab bar. Keep electrical cords out of the way. Do not use floor polish or wax that makes floors slippery. If you must use wax, use non-skid floor wax. Do not have throw rugs and other things on the floor that can make you trip.  What can I do with my stairs? Do not leave any items on the stairs. Make sure that there are handrails on both sides of the stairs and use them. Fix handrails that are broken or loose. Make sure that handrails are as long as the stairways. Check any carpeting to make sure that it is firmly attached to the stairs. Fix any carpet that is loose or worn. Avoid having throw rugs at the top or bottom of the stairs. If you do have throw rugs, attach them to the floor with carpet tape. Make sure that you have a light switch at the top of the stairs and the bottom of the stairs. If you do not have them, ask someone to add them for you. What else can I do to help prevent falls? Wear shoes that: Do not have high heels. Have rubber bottoms. Are comfortable and fit you well. Are closed at the toe. Do not wear sandals. If you use a stepladder: Make sure that it is fully opened. Do not climb a closed  stepladder. Make sure that both sides of the stepladder are locked into place. Ask someone to hold it for you, if possible. Clearly mark and make sure that you can see: Any grab bars or handrails. First and last steps. Where the edge of each step is. Use tools that help you move around (mobility aids) if they are needed. These include: Canes. Walkers. Scooters. Crutches. Turn on the lights when you go into a dark area. Replace any light bulbs as soon as they burn out. Set up your furniture so you have a clear path. Avoid moving your furniture around. If any of your floors are uneven, fix them. If there are any pets around you, be aware of where they are. Review your medicines with your doctor. Some medicines can make you feel dizzy. This can increase your chance of falling. Ask your doctor what other things that you can do to help prevent falls. This information is not intended to replace advice given to you by your health care provider. Make sure you discuss any questions you have with your health care provider. Document Released: 07/04/2009 Document Revised: 02/13/2016 Document Reviewed: 10/12/2014 Elsevier Interactive Patient Education  2017 [employer].

## 2023-01-10 IMAGING — CT CT CHEST LUNG CANCER SCREENING LOW DOSE W/O CM
1 of 2 series · 15 of 33 positions shown, 19 images · non-contrast
Comparison: None.

CLINICAL DATA: 65-year-old male with 50 pack-year history of
smoking. Lung cancer screening.



[Series 4: chest 1mm/super d · axial · 0.74mm/px · z∈[-393,-67]mm · 15 of 447 slices shown, 19 images]
[im 20/447  mediastinal]
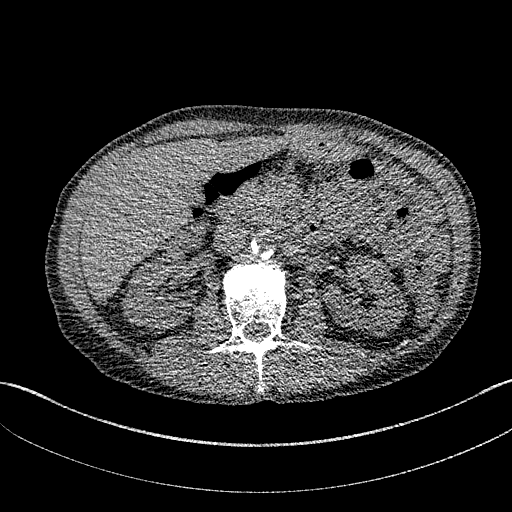
[im 20/447  lung]
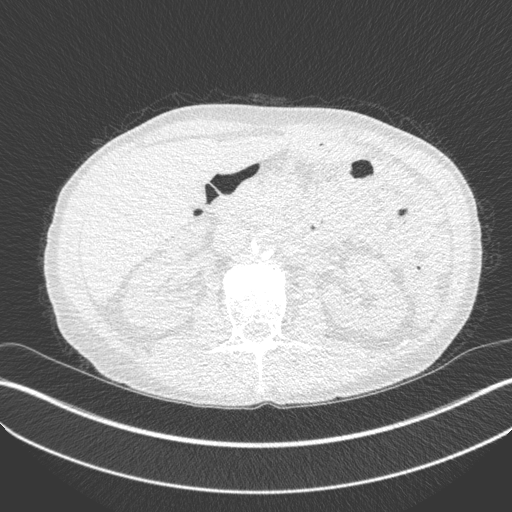
[im 59/447  lung]
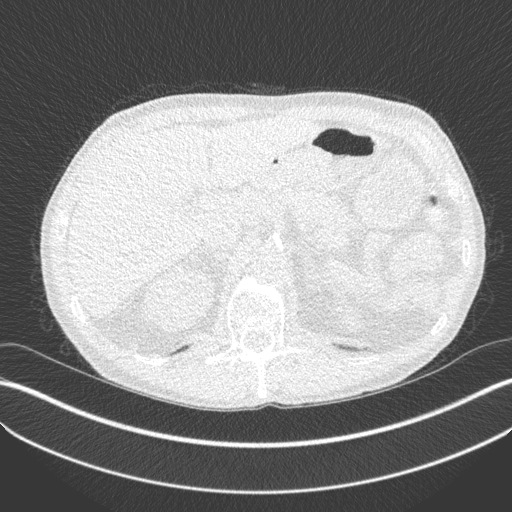
[im 97/447  lung]
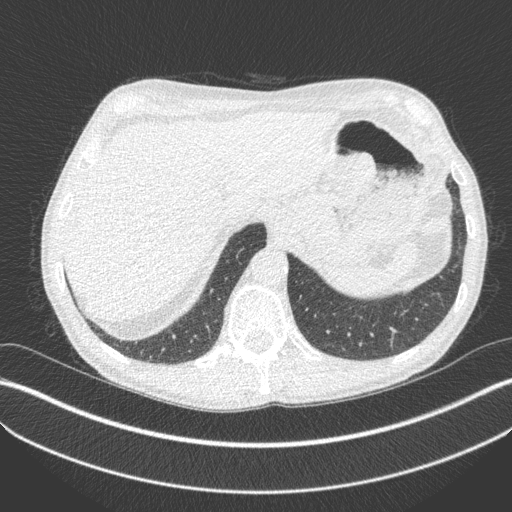
[im 117/447  lung]
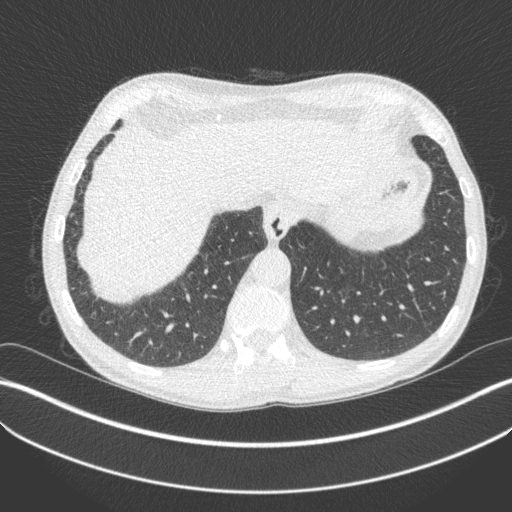
[im 136/447  mediastinal]
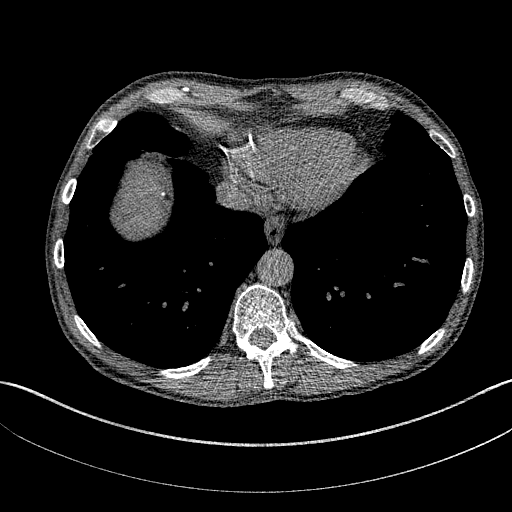
[im 136/447  lung]
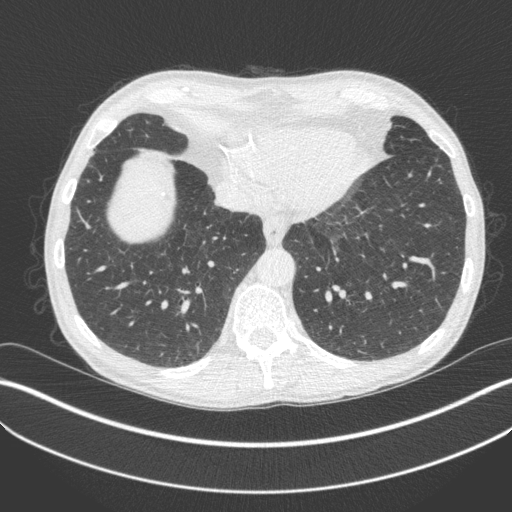
[im 175/447  lung]
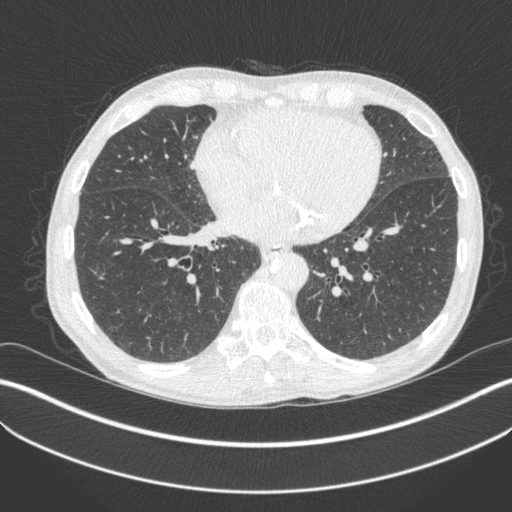
[im 212/447  lung]
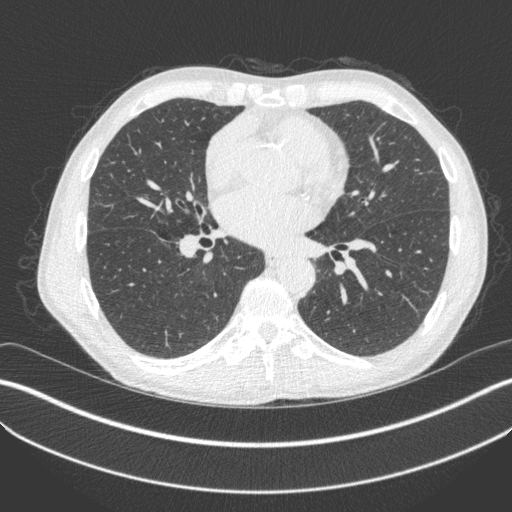
[im 224/447  lung]
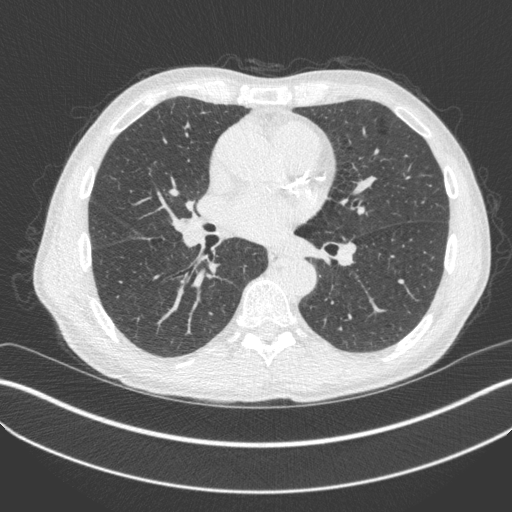
[im 233/447  mediastinal]
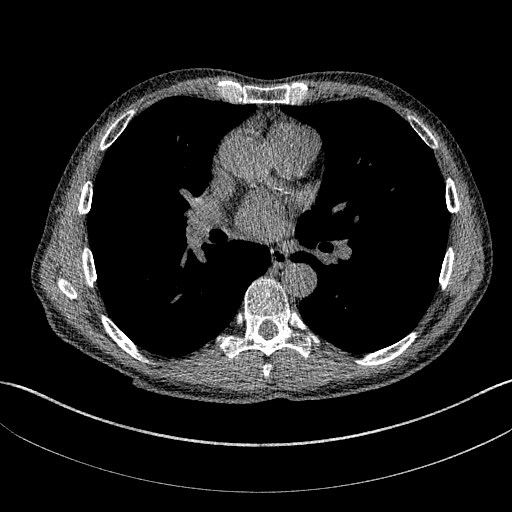
[im 233/447  lung]
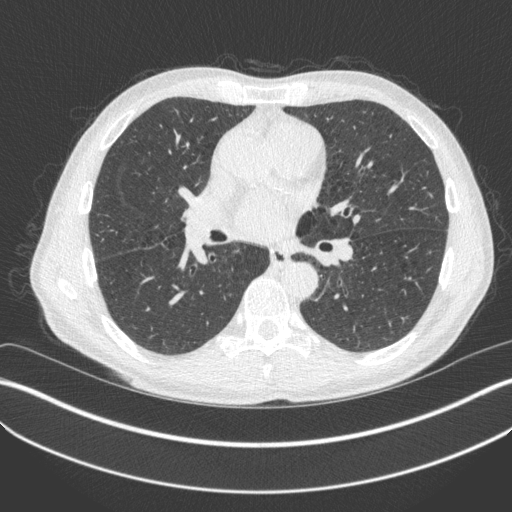
[im 272/447  lung]
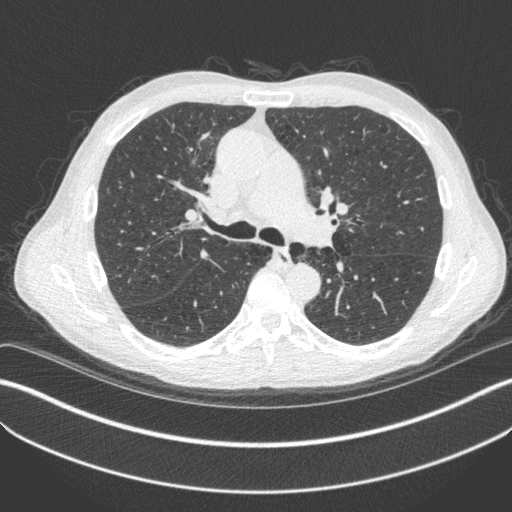
[im 311/447  lung]
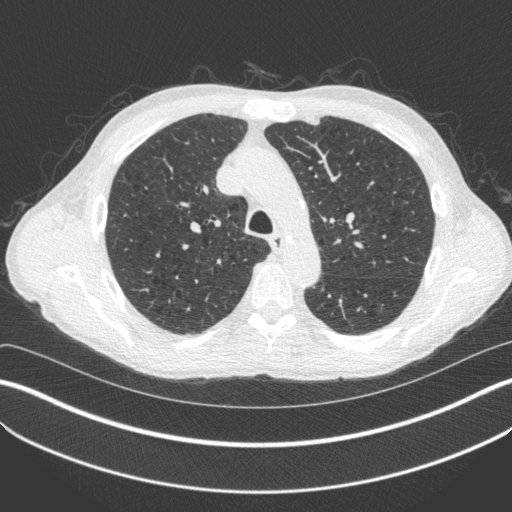
[im 330/447  lung]
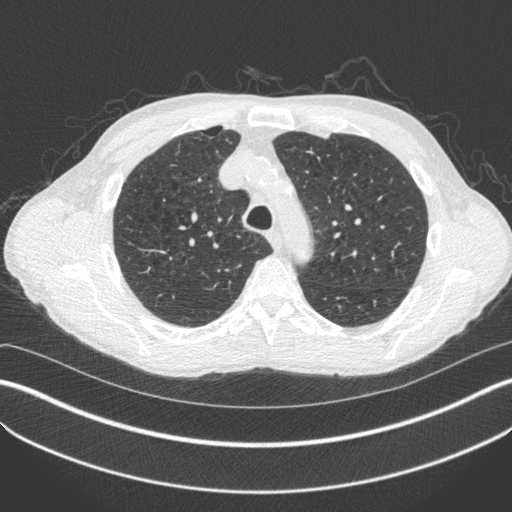
[im 350/447  mediastinal]
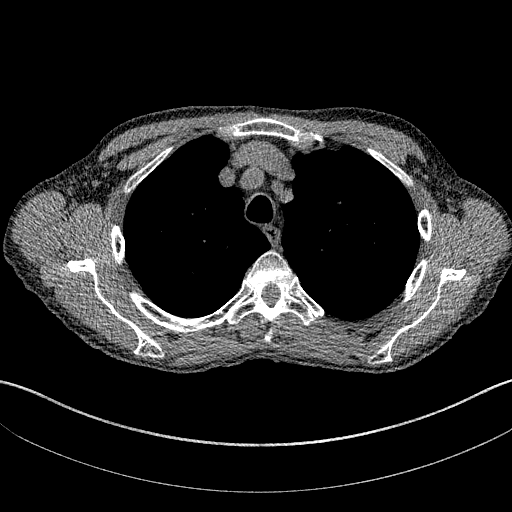
[im 350/447  lung]
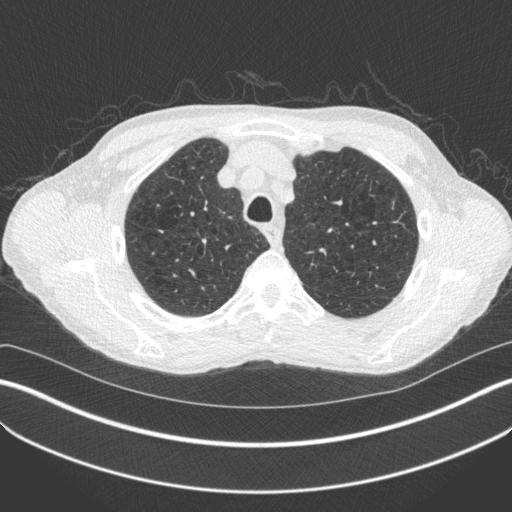
[im 388/447  lung]
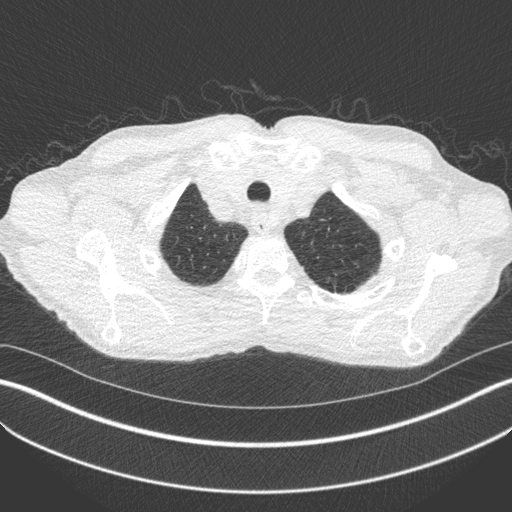
[im 427/447  lung]
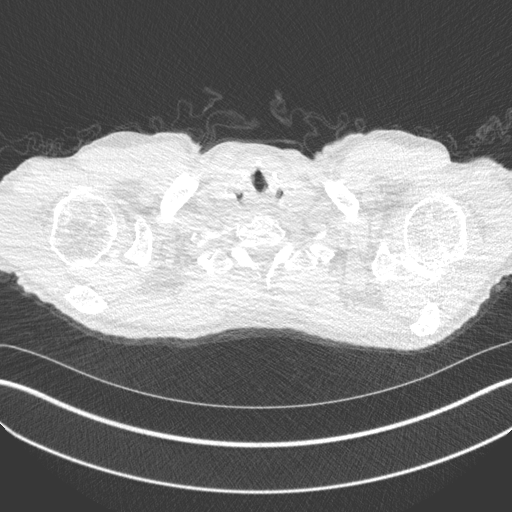

[15 of 33 positions shown; findings below may reference images not displayed]

FINDINGS: Cardiovascular: The heart size is normal. No substantial pericardial
effusion. Coronary artery calcification is evident. Mild
atherosclerotic calcification is noted in the wall of the thoracic
aorta.

Mediastinum/Nodes: No mediastinal lymphadenopathy. No evidence for
gross hilar lymphadenopathy although assessment is limited by the
lack of intravenous contrast on the current study. The esophagus has
normal imaging features. There is no axillary lymphadenopathy.

Lungs/Pleura: Centrilobular and paraseptal emphysema evident. 7.8 mm
perifissural right middle lobe nodule identified on image 203/series
3. No other suspicious pulmonary nodule or mass. No focal airspace
consolidation. No pleural effusion.

Upper Abdomen: Unremarkable.

Musculoskeletal: No worrisome lytic or sclerotic osseous
abnormality.
IMPRESSION: 1. Lung-RADS 3, probably benign findings. Short-term follow-up in 6
months is recommended with repeat low-dose chest CT without contrast
(please use the following order, "CT CHEST LCS NODULE FOLLOW-UP W/O
CM").
2. Aortic Atherosclerosis (1MKQU-HO7.7) and Emphysema (1MKQU-9V0.4).

These results will be called to the ordering clinician or
representative by the Radiologist Assistant, and communication
documented in the PACS or [REDACTED].

## 2023-04-06 ENCOUNTER — Ambulatory Visit: Payer: PPO | Admitting: Family Medicine

## 2023-04-14 ENCOUNTER — Encounter: Payer: Self-pay | Admitting: Family Medicine

## 2023-04-14 ENCOUNTER — Ambulatory Visit (INDEPENDENT_AMBULATORY_CARE_PROVIDER_SITE_OTHER): Payer: PPO | Admitting: Family Medicine

## 2023-04-14 VITALS — BP 128/66 | HR 87 | Temp 97.6°F | Resp 16 | Ht 68.0 in | Wt 166.3 lb

## 2023-04-14 DIAGNOSIS — F319 Bipolar disorder, unspecified: Secondary | ICD-10-CM

## 2023-04-14 DIAGNOSIS — I10 Essential (primary) hypertension: Secondary | ICD-10-CM | POA: Diagnosis not present

## 2023-04-14 DIAGNOSIS — F17200 Nicotine dependence, unspecified, uncomplicated: Secondary | ICD-10-CM

## 2023-04-14 DIAGNOSIS — F17210 Nicotine dependence, cigarettes, uncomplicated: Secondary | ICD-10-CM

## 2023-04-14 NOTE — Patient Instructions (Signed)
.   Please review the attached list of medications and notify my office if there are any errors.   . Please bring all of your medications to every appointment so we can make sure that our medication list is the same as yours.   

## 2023-04-19 NOTE — Progress Notes (Signed)
      Established patient visit   Patient: Eric Hatfield.   DOB: 06/03/1956   67 y.o. Male  MRN: [id number] Visit Date: 04/14/2023  Today's healthcare provider: Mila Merry, MD   Chief Complaint  Patient presents with   Hypertension   Subjective    Hypertension Pertinent negatives include no chest pain, palpitations or shortness of breath.    Here today for routine follow up hypertension, doing well on current medications without complaints. Continues on depakote, Risperdal, and escitalopram prescribed by his psychiatrist for BPAD.   Medications: Outpatient Medications Prior to Visit  Medication Sig   divalproex (DEPAKOTE) 500 MG DR tablet Take 1 tablet (500 mg total) by mouth 2 (two) times daily.   escitalopram (LEXAPRO) 10 MG tablet Take 1 tablet (10 mg total) by mouth daily after breakfast.   fluticasone (FLONASE) 50 MCG/ACT nasal spray Place 1 spray into both nostrils daily.   irbesartan (AVAPRO) 75 MG tablet Take 1 tablet (75 mg total) by mouth daily.   risperiDONE (RISPERDAL) 3 MG tablet Take 1 tablet (3 mg total) by mouth daily.   No facility-administered medications prior to visit.    Review of Systems  Constitutional:  Negative for appetite change, chills and fever.  Respiratory:  Negative for chest tightness, shortness of breath and wheezing.   Cardiovascular:  Negative for chest pain and palpitations.  Gastrointestinal:  Negative for abdominal pain, nausea and vomiting.       Objective    BP 128/66 (BP Location: Left Arm, Patient Position: Sitting, Cuff Size: Large)   Pulse 87   Temp 97.6 F (36.4 C) (Temporal)   Resp 16   Ht 5\' 8"  (1.727 m)   Wt 166 lb 4.8 oz (75.4 kg)   SpO2 96%   BMI 25.29 kg/m    Physical Exam   General: Appearance:    Well developed, well nourished male in no acute distress  Eyes:    PERRL, conjunctiva/corneas clear, EOM's intact       Lungs:     Clear to auscultation bilaterally, respirations unlabored  Heart:     Normal heart rate. Normal rhythm.  2/6 systolic murmur at right upper sternal border   MS:   All extremities are intact.    Neurologic:   Awake, alert, oriented x 3. No apparent focal neurological defect.         Assessment & Plan     1. Primary hypertension Well controlled.  Continue current medications.    2. Bipolar affective disorder, remission status unspecified (HCC) Well controlled, continue current medications and routine psychiatry follow up.   3. Smoking greater than 30 pack years Encouraged smoking cessation, declined referral for LC screening.    Return in about 4 months (around 08/15/2023).       Mila Merry, MD  [facility] [phone] (phone) [phone] (fax)  [facility]

## 2023-08-18 ENCOUNTER — Ambulatory Visit: Payer: PPO | Admitting: Family Medicine

## 2023-08-18 ENCOUNTER — Encounter: Payer: Self-pay | Admitting: Family Medicine

## 2023-08-18 VITALS — BP 120/86 | HR 107 | Resp 16 | Ht 68.0 in | Wt 172.2 lb

## 2023-08-18 DIAGNOSIS — I10 Essential (primary) hypertension: Secondary | ICD-10-CM

## 2023-08-18 DIAGNOSIS — E871 Hypo-osmolality and hyponatremia: Secondary | ICD-10-CM

## 2023-08-18 DIAGNOSIS — F319 Bipolar disorder, unspecified: Secondary | ICD-10-CM

## 2023-08-18 DIAGNOSIS — Z125 Encounter for screening for malignant neoplasm of prostate: Secondary | ICD-10-CM | POA: Diagnosis not present

## 2023-08-18 DIAGNOSIS — Z23 Encounter for immunization: Secondary | ICD-10-CM | POA: Diagnosis not present

## 2023-08-19 LAB — COMPREHENSIVE METABOLIC PANEL
ALT: 10 [IU]/L (ref 0–44)
AST: 15 [IU]/L (ref 0–40)
Albumin: 4.1 g/dL (ref 3.9–4.9)
Alkaline Phosphatase: 113 [IU]/L (ref 44–121)
BUN/Creatinine Ratio: 14 (ref 10–24)
BUN: 11 mg/dL (ref 8–27)
Bilirubin Total: 0.3 mg/dL (ref 0.0–1.2)
CO2: 23 mmol/L (ref 20–29)
Calcium: 9.6 mg/dL (ref 8.6–10.2)
Chloride: 97 mmol/L (ref 96–106)
Creatinine, Ser: 0.81 mg/dL (ref 0.76–1.27)
Globulin, Total: 2.6 g/dL (ref 1.5–4.5)
Glucose: 117 mg/dL — ABNORMAL HIGH (ref 70–99)
Potassium: 4.4 mmol/L (ref 3.5–5.2)
Sodium: 135 mmol/L (ref 134–144)
Total Protein: 6.7 g/dL (ref 6.0–8.5)
eGFR: 97 mL/min/{1.73_m2} (ref >59)

## 2023-08-19 LAB — LIPID PANEL
Chol/HDL Ratio: 3.6 {ratio} (ref 0.0–5.0)
Cholesterol, Total: 189 mg/dL (ref 100–199)
HDL: 52 mg/dL (ref >39)
LDL Chol Calc (NIH): 126 mg/dL — ABNORMAL HIGH (ref 0–99)
Triglycerides: 61 mg/dL (ref 0–149)
VLDL Cholesterol Cal: 11 mg/dL (ref 5–40)

## 2023-08-19 LAB — CBC
Hematocrit: 42.5 % (ref 37.5–51.0)
Hemoglobin: 14.8 g/dL (ref 13.0–17.7)
MCH: 33.1 pg — ABNORMAL HIGH (ref 26.6–33.0)
MCHC: 34.8 g/dL (ref 31.5–35.7)
MCV: 95 fL (ref 79–97)
Platelets: 247 10*3/uL (ref 150–450)
RBC: 4.47 x10E6/uL (ref 4.14–5.80)
RDW: 12.4 % (ref 11.6–15.4)
WBC: 6.5 10*3/uL (ref 3.4–10.8)

## 2023-08-19 LAB — TSH: TSH: 1.22 u[IU]/mL (ref 0.450–4.500)

## 2023-08-19 LAB — PSA TOTAL (REFLEX TO FREE): Prostate Specific Ag, Serum: 1.5 ng/mL (ref 0.0–4.0)

## 2023-08-23 ENCOUNTER — Telehealth: Payer: Self-pay

## 2023-08-23 ENCOUNTER — Telehealth: Payer: Self-pay | Admitting: Family Medicine

## 2023-08-23 MED ORDER — ROSUVASTATIN CALCIUM 5 MG PO TABS: 5.0000 mg | ORAL_TABLET | Freq: Every day | ORAL | 1 refills | Status: DC

## 2023-08-23 NOTE — Telephone Encounter (Signed)
Already sent to the pharmacy today in a separate refill encounter.

## 2023-08-23 NOTE — Telephone Encounter (Signed)
Medication Refill -  Most Recent Primary Care Visit:  Provider: Malva Limes  Department: [facility]  Visit Type: OFFICE VISIT  Date: 08/18/2023  Medication: rosuvastatin 5mg    Has the patient contacted their pharmacy? Yes Pharmacy said to contact provider  Is this the correct pharmacy for this prescription? Yes  This is the patient's preferred pharmacy: [facility] - [place], [place] - [address] [place] [address] Phone: [phone] Fax: [phone]   Has the prescription been filled recently? No  Is the patient out of the medication? Yes  Has the patient been seen for an appointment in the last year OR does the patient have an upcoming appointment? Yes  Can we respond through MyChart? No  Agent: Please be advised that Rx refills may take up to 3 business days. We ask that you follow-up with your pharmacy.  Medication is not on his med list.

## 2023-08-23 NOTE — Addendum Note (Signed)
Addended by: Shirley Muscat on: 08/23/2023 02:38 PM   Modules accepted: Orders

## 2023-08-23 NOTE — Telephone Encounter (Signed)
Eric Limes, MD 08/20/2023  9:48 AM EST     LDL cholesterol is too high. At 126. Needs to be under 100. Need to start rosuvastatin 5mg , one tablet daily. Please send prescription for #90 and 1 refill. Rest of labs are good. Please schedule follow up o.v. mid February to check on BP and cholesterol.   Spoke with pt's wife. Please call in cholesterol medication for pt. Follow up appt made in mid February.

## 2023-08-24 NOTE — Progress Notes (Signed)
      Established patient visit   Patient: Eric Hatfield.   DOB: Jan 15, 1956   67 y.o. Male  MRN: [id number] Visit Date: 08/18/2023  Today's healthcare provider: Mila Merry, MD   Chief Complaint  Patient presents with   Medical Management of Chronic Issues    HTN   Subjective    HPI Here for follow up hypertension and hyperlipidemia. Fells well, no complaints, taking meds consistently. Continues to follow up Dr. Marlou Porch for BPAD, doing well on current medications.   Medications: Outpatient Medications Prior to Visit  Medication Sig   divalproex (DEPAKOTE) 500 MG DR tablet Take 1 tablet (500 mg total) by mouth 2 (two) times daily.   escitalopram (LEXAPRO) 10 MG tablet Take 1 tablet (10 mg total) by mouth daily after breakfast.   fluticasone (FLONASE) 50 MCG/ACT nasal spray Place 1 spray into both nostrils daily.   irbesartan (AVAPRO) 75 MG tablet Take 1 tablet (75 mg total) by mouth daily.   risperiDONE (RISPERDAL) 3 MG tablet Take 1 tablet (3 mg total) by mouth daily.   No facility-administered medications prior to visit.    Review of Systems     Objective    BP 120/86 (BP Location: Left Arm, Patient Position: Sitting, Cuff Size: Normal)   Pulse (!) 107   Resp 16   Ht 5\' 8"  (1.727 m)   Wt 172 lb 3.2 oz (78.1 kg)   SpO2 95%   BMI 26.18 kg/m    Physical Exam   General: Appearance:    Well developed, well nourished male in no acute distress  Eyes:    PERRL, conjunctiva/corneas clear, EOM's intact       Lungs:     Clear to auscultation bilaterally, respirations unlabored  Heart:    Tachycardic. Normal rhythm.  2/6 systolic murmur   MS:   All extremities are intact.    Neurologic:   Awake, alert, oriented x 3. No apparent focal neurological defect.          Assessment & Plan     1. Primary hypertension Well controlled.  Continue current medications.   - CBC - Comprehensive metabolic panel - Lipid panel - TSH  2. Hyponatremia Check electrolytes  today.   3. Bipolar affective disorder, remission status unspecified (HCC) Stable on current meds managed by Dr. Marlou Porch  4. Prostate cancer screening  - PSA Total (Reflex To Free)  5. Need for influenza vaccination  - Flu Vaccine Trivalent High Dose (Fluad)         Mila Merry, MD  [facility] [phone] (phone) [phone] (fax)  [facility]

## 2023-09-10 ENCOUNTER — Telehealth: Payer: Self-pay | Admitting: *Deleted

## 2023-09-10 NOTE — Telephone Encounter (Signed)
FYI: Pt had lung screening CT 10/30/21 that read has a Lung Rads 3. We made several attempts to call pt and also mailed letters to have pt schedule the follow up CT.  I tried again today and spoke with patient and he stated he does not want to schedule at this time. I asked if I could call him back after the first of the year to schedule but he stated he didn't know when he would be ready to schedule. I stressed the importance of getting this scan done and advised that I would update Dr Sherrie Mustache as well.

## 2023-09-13 ENCOUNTER — Ambulatory Visit: Payer: PPO | Admitting: Family Medicine

## 2023-09-22 ENCOUNTER — Other Ambulatory Visit: Payer: Self-pay | Admitting: Family Medicine

## 2023-11-03 ENCOUNTER — Ambulatory Visit: Payer: Self-pay | Admitting: Family Medicine

## 2023-11-03 DIAGNOSIS — R911 Solitary pulmonary nodule: Secondary | ICD-10-CM

## 2023-11-03 DIAGNOSIS — R918 Other nonspecific abnormal finding of lung field: Secondary | ICD-10-CM

## 2023-11-13 ENCOUNTER — Other Ambulatory Visit: Payer: Self-pay | Admitting: Family Medicine

## 2023-11-30 ENCOUNTER — Encounter: Payer: Self-pay | Admitting: Family Medicine

## 2023-11-30 ENCOUNTER — Ambulatory Visit (INDEPENDENT_AMBULATORY_CARE_PROVIDER_SITE_OTHER): Payer: PPO | Admitting: Family Medicine

## 2023-11-30 VITALS — BP 112/81 | HR 94 | Resp 16 | Wt 166.2 lb

## 2023-11-30 DIAGNOSIS — E785 Hyperlipidemia, unspecified: Secondary | ICD-10-CM

## 2023-11-30 DIAGNOSIS — I10 Essential (primary) hypertension: Secondary | ICD-10-CM

## 2023-11-30 DIAGNOSIS — F3164 Bipolar disorder, current episode mixed, severe, with psychotic features: Secondary | ICD-10-CM | POA: Diagnosis not present

## 2023-11-30 NOTE — Progress Notes (Unsigned)
      Established patient visit   Patient: Eric Hatfield.   DOB: [birth year]   68 y.o. Male  MRN: [id number] Visit Date: 11/30/2023  Today's healthcare provider: Mila Merry, MD   Chief Complaint  Patient presents with   Medical Management of Chronic Issues     BP and cholesterol follow-up   Hypertension   Hyperlipidemia   Subjective    Hypertension Pertinent negatives include no chest pain, palpitations or shortness of breath.  Hyperlipidemia Pertinent negatives include no chest pain or shortness of breath.   Follow up htn and lipids since starting rosuvastatin 5mg  3 mos ago. Is tolerating well with no perceived side effects.  Lab Results  Component Value Date   CHOL 189 08/18/2023   HDL 52 08/18/2023   LDLCALC 126 (H) 08/18/2023   TRIG 61 08/18/2023   CHOLHDL 3.6 08/18/2023   Lab Results  Component Value Date   NA 135 08/18/2023   K 4.4 08/18/2023   CREATININE 0.81 08/18/2023   EGFR 97 08/18/2023   GLUCOSE 117 (H) 08/18/2023   The 10-year ASCVD risk score (Arnett DK, et al., 2019) is: 16.1%    Medications: Outpatient Medications Prior to Visit  Medication Sig   divalproex (DEPAKOTE) 500 MG DR tablet Take 1 tablet (500 mg total) by mouth 2 (two) times daily.   escitalopram (LEXAPRO) 10 MG tablet Take 1 tablet (10 mg total) by mouth daily after breakfast.   irbesartan (AVAPRO) 75 MG tablet TAKE 1 TABLET BY MOUTH DAILY.   risperiDONE (RISPERDAL) 3 MG tablet Take 1 tablet (3 mg total) by mouth daily.   rosuvastatin (CRESTOR) 5 MG tablet TAKE ONE TABLET BY MOUTH ONCE DAILY   fluticasone (FLONASE) 50 MCG/ACT nasal spray Place 1 spray into both nostrils daily. (Patient not taking: Reported on 11/30/2023)   No facility-administered medications prior to visit.    Review of Systems  Constitutional:  Negative for appetite change, chills and fever.  Respiratory:  Negative for chest tightness, shortness of breath and wheezing.   Cardiovascular:  Negative for chest  pain and palpitations.  Gastrointestinal:  Negative for abdominal pain, nausea and vomiting.   {Insert previous labs (optional):23779} {See past labs  Heme  Chem  Endocrine  Serology  Results Review (optional):1}   Objective    BP 112/81 (BP Location: Left Arm, Patient Position: Sitting, Cuff Size: Normal)   Pulse 94   Resp 16   Wt 166 lb 3.2 oz (75.4 kg)   SpO2 97%   BMI 25.27 kg/m  {Insert last BP/Wt (optional):23777}{See vitals history (optional):1}  Physical Exam  General appearance: Well developed, well nourished male, cooperative and in no acute distress Head: Normocephalic, without obvious abnormality, atraumatic Respiratory: Respirations even and unlabored, normal respiratory rate Extremities: All extremities are intact.  Skin: Skin color, texture, turgor normal. No rashes seen  Psych: Appropriate mood and affect. Neurologic: Mental status: Alert, oriented to person, place, and time, thought content appropriate.   Assessment & Plan     1. Hyperlipidemia, unspecified hyperlipidemia type (Primary) Tolerating initiation of rosuvastatin well with no adverse effects.   - Lipid panel - Apolipoprotein B - Lipoprotein A (LPA) - Comprehensive metabolic panel - Direct LDL  2. Primary hypertension Well controlled.  Continue current medications.           Mila Merry, MD  [facility] [phone] (phone) [phone] (fax)  [facility]

## 2023-12-02 LAB — COMPREHENSIVE METABOLIC PANEL
ALT: 8 IU/L (ref 0–44)
AST: 11 IU/L (ref 0–40)
Albumin: 3.8 g/dL — ABNORMAL LOW (ref 3.9–4.9)
Alkaline Phosphatase: 114 IU/L (ref 44–121)
BUN/Creatinine Ratio: 11 (ref 10–24)
BUN: 10 mg/dL (ref 8–27)
Bilirubin Total: 0.3 mg/dL (ref 0.0–1.2)
CO2: 21 mmol/L (ref 20–29)
Calcium: 9.4 mg/dL (ref 8.6–10.2)
Chloride: 95 mmol/L — ABNORMAL LOW (ref 96–106)
Creatinine, Ser: 0.9 mg/dL (ref 0.76–1.27)
Globulin, Total: 2.3 g/dL (ref 1.5–4.5)
Glucose: 156 mg/dL — ABNORMAL HIGH (ref 70–99)
Potassium: 4.4 mmol/L (ref 3.5–5.2)
Sodium: 132 mmol/L — ABNORMAL LOW (ref 134–144)
Total Protein: 6.1 g/dL (ref 6.0–8.5)
eGFR: 94 mL/min/{1.73_m2} (ref >59)

## 2023-12-02 LAB — LIPID PANEL
Chol/HDL Ratio: 2.9 ratio (ref 0.0–5.0)
Cholesterol, Total: 127 mg/dL (ref 100–199)
HDL: 44 mg/dL (ref >39)
LDL Chol Calc (NIH): 71 mg/dL (ref 0–99)
Triglycerides: 55 mg/dL (ref 0–149)
VLDL Cholesterol Cal: 12 mg/dL (ref 5–40)

## 2023-12-02 LAB — LIPOPROTEIN A (LPA): Lipoprotein (a): 10.3 nmol/L (ref <75.0)

## 2023-12-02 LAB — APOLIPOPROTEIN B: Apolipoprotein B: 77 mg/dL (ref <90)

## 2023-12-02 LAB — LDL CHOLESTEROL, DIRECT: LDL Direct: 73 mg/dL (ref 0–99)

## 2024-01-04 ENCOUNTER — Encounter: Payer: Self-pay | Admitting: *Deleted

## 2024-01-05 ENCOUNTER — Ambulatory Visit: Payer: Self-pay

## 2024-01-05 DIAGNOSIS — Z0000 Encounter for general adult medical examination without abnormal findings: Secondary | ICD-10-CM | POA: Diagnosis not present

## 2024-01-05 NOTE — Patient Instructions (Addendum)
 Mr. Hamad , Thank you for taking time to come for your Medicare Wellness Visit. I appreciate your ongoing commitment to your health goals. Please review the following plan we discussed and let me know if I can assist you in the future.   Referrals/Orders/Follow-Ups/Clinician Recommendations: NONE  This is a list of the screening recommended for you and due dates:  Health Maintenance  Topic Date Due   Pneumonia Vaccine (1 of 2 - PCV) Never done   Colon Cancer Screening  Never done   Zoster (Shingles) Vaccine (1 of 2) Never done   Screening for Lung Cancer  10/30/2022   COVID-19 Vaccine (1 - 2024-25 season) Never done   Flu Shot  04/21/2024   Medicare Annual Wellness Visit  01/04/2025   DTaP/Tdap/Td vaccine (3 - Td or Tdap) 02/20/2027   Hepatitis C Screening  Completed   HPV Vaccine  Aged Out   Meningitis B Vaccine  Aged Out    Advanced directives: (ACP Link)Information on Advanced Care Planning can be found at   [employer] Advance Health Care Directives Advance Health Care Directives. [email or web address]   Next Medicare Annual Wellness Visit scheduled for next year: Yes  01/10/25 @ 10:50 AM BY PHONE

## 2024-01-05 NOTE — Progress Notes (Signed)
 Subjective:   Eric Hatfield. is a 68 y.o. who presents for a Medicare Wellness preventive visit.  Visit Complete: Virtual I connected with  Eric Hatfield. on 01/05/24 by a audio enabled telemedicine application and verified that I am speaking with the correct person using two identifiers.  Patient Location: Home  Provider Location: Office/Clinic  I discussed the limitations of evaluation and management by telemedicine. The patient expressed understanding and agreed to proceed.  Vital Signs: Because this visit was a virtual/telehealth visit, some criteria may be missing or patient reported. Any vitals not documented were not able to be obtained and vitals that have been documented are patient reported.  VideoDeclined- This patient declined Librarian, academic. Therefore the visit was completed with audio only.  Persons Participating in Visit: Patient.  AWV Questionnaire: No: Patient Medicare AWV questionnaire was not completed prior to this visit.  Cardiac Risk Factors include: advanced age (>67men, >60 women);hypertension;male gender;sedentary lifestyle;smoking/ tobacco exposure     Objective:    There were no vitals filed for this visit. There is no height or weight on file to calculate BMI.     01/05/2024   10:58 AM 01/04/2023   11:03 AM 05/02/2022   11:02 AM 04/24/2022    5:45 AM 04/23/2022    1:36 PM 04/03/2022    6:04 PM 05/17/2021    3:33 PM  Advanced Directives  Does Patient Have a Medical Advance Directive? No No No  No No No  Would patient like information on creating a medical advance directive? No - Patient declined  No - Patient declined  No - Patient declined No - Patient declined      Information is confidential and restricted. Go to Review Flowsheets to unlock data.    Current Medications (verified) Outpatient Encounter Medications as of 01/05/2024  Medication Sig   divalproex (DEPAKOTE) 500 MG DR tablet Take 1 tablet (500 mg  total) by mouth 2 (two) times daily.   escitalopram (LEXAPRO) 10 MG tablet Take 1 tablet (10 mg total) by mouth daily after breakfast.   irbesartan (AVAPRO) 75 MG tablet TAKE 1 TABLET BY MOUTH DAILY.   risperiDONE (RISPERDAL) 3 MG tablet Take 1 tablet (3 mg total) by mouth daily.   rosuvastatin (CRESTOR) 5 MG tablet TAKE ONE TABLET BY MOUTH ONCE DAILY   fluticasone (FLONASE) 50 MCG/ACT nasal spray Place 1 spray into both nostrils daily. (Patient not taking: Reported on 01/05/2024)   No facility-administered encounter medications on file as of 01/05/2024.    Allergies (verified) Patient has no known allergies.   History: Past Medical History:  Diagnosis Date   Bipolar disorder (HCC)    Hypertension    Past Surgical History:  Procedure Laterality Date   Impacted tooth     Family History  Problem Relation Age of Onset   Hypertension Mother    Hyperlipidemia Father    Hypertension Brother    Mental illness Paternal Grandmother    Social History   Socioeconomic History   Marital status: Married    Spouse name: Eric Hatfield   Number of children: Not on file   Years of education: Not on file   Highest education level: Not on file  Occupational History   Not on file  Tobacco Use   Smoking status: Every Day    Current packs/day: 0.75    Average packs/day: 0.8 packs/day for 48.6 years (36.5 ttl pk-yrs)    Types: Cigarettes    Start date:  05/31/1975   Smokeless tobacco: Never   Tobacco comments:    started smoking as a teenager average 1 ppd  Vaping Use   Vaping status: Never Used  Substance and Sexual Activity   Alcohol use: Not Currently    Comment: quit drking in his 27s.    Drug use: Not Currently   Sexual activity: Yes    Partners: Female    Birth control/protection: None  Other Topics Concern   Not on file  Social History Narrative   Not on file   Social Drivers of Health   Financial Resource Strain: Low Risk  (01/05/2024)   Overall Financial Resource  Strain (CARDIA)    Difficulty of Paying Living Expenses: Not very hard  Food Insecurity: No Food Insecurity (01/05/2024)   Hunger Vital Sign    Worried About Running Out of Food in the Last Year: Never true    Ran Out of Food in the Last Year: Never true  Transportation Needs: No Transportation Needs (01/05/2024)   PRAPARE - Administrator, Civil Service (Medical): No    Lack of Transportation (Non-Medical): No  Physical Activity: Insufficiently Active (01/05/2024)   Exercise Vital Sign    Days of Exercise per Week: 2 days    Minutes of Exercise per Session: 20 min  Stress: No Stress Concern Present (01/05/2024)   [employer] of Occupational Health - Occupational Stress Questionnaire    Feeling of Stress : Not at all  Social Connections: Moderately Isolated (01/05/2024)   Social Connection and Isolation Panel [NHANES]    Frequency of Communication with Friends and Family: Twice a week    Frequency of Social Gatherings with Friends and Family: Once a week    Attends Religious Services: Never    Database administrator or Organizations: No    Attends Engineer, structural: Never    Marital Status: Married    Tobacco Counseling Ready to quit: Not Answered Counseling given: Not Answered Tobacco comments: started smoking as a teenager average 1 ppd    Clinical Intake:  Pre-visit preparation completed: Yes  Pain : No/denies pain     BMI - recorded: 25.2 Nutritional Status: BMI 25 -29 Overweight Nutritional Risks: None Diabetes: No  Lab Results  Component Value Date   HGBA1C 5.7 (H) 04/04/2022   HGBA1C 5.8 12/24/2010     How often do you need to have someone help you when you read instructions, pamphlets, or other written materials from your doctor or pharmacy?: 1 - Never  Interpreter Needed?: No  Information entered by :: Dellie Fergusson, LPN   Activities of Daily Living    01/05/2024   10:59 AM 04/14/2023   11:06 AM  In your present state of  health, do you have any difficulty performing the following activities:  Hearing? 0 0  Vision? 0 0  Difficulty concentrating or making decisions? 0 0  Walking or climbing stairs? 0 1  Dressing or bathing? 0 0  Doing errands, shopping? 0 1  Preparing Food and eating ? N   Using the Toilet? N   In the past six months, have you accidently leaked urine? N   Do you have problems with loss of bowel control? N   Managing your Medications? N   Managing your Finances? N   Housekeeping or managing your Housekeeping? N     Patient Care Team: Lamon Pillow, MD as PCP - General (Family Medicine) Vergil Glasser, MD as Consulting Physician (Psychiatry) Babette Lesches  Ramon Dredge, DO as Consulting Physician (Psychiatry) Pa, [facility]  Indicate any recent Medical Services you may have received from other than [facility] providers in the past year (date may be approximate).     Assessment:   This is a routine wellness examination for Bryant.  Hearing/Vision screen Hearing Screening - Comments:: NO AIDS Vision Screening - Comments:: [facility]   Goals Addressed             This Visit's Progress    DIET - EAT MORE FRUITS AND VEGETABLES         Depression Screen     01/05/2024   10:57 AM 11/30/2023   10:59 AM 08/18/2023    9:52 AM 04/14/2023   11:06 AM 01/04/2023   10:57 AM 08/04/2022   11:25 AM 09/19/2021    4:06 PM  PHQ 2/9 Scores  PHQ - 2 Score 0 4 4 4 4 4 2   PHQ- 9 Score 0 13 6 8 5 8 6     Fall Risk     01/05/2024   10:59 AM 11/30/2023   10:56 AM 08/18/2023    9:51 AM 04/14/2023   11:06 AM 01/04/2023   10:56 AM  Fall Risk   Falls in the past year? 0 0 0 0 0  Number falls in past yr: 0 0 0 0 0  Injury with Fall? 0 0 0  0  Risk for fall due to : No Fall Risks No Fall Risks No Fall Risks No Fall Risks No Fall Risks  Follow up Falls prevention discussed;Falls evaluation completed   Falls evaluation completed Education provided;Falls prevention discussed     MEDICARE RISK AT HOME:  Medicare Risk at Home Any stairs in or around the home?: No If so, are there any without handrails?: No Home free of loose throw rugs in walkways, pet beds, electrical cords, etc?: Yes Adequate lighting in your home to reduce risk of falls?: Yes Life alert?: No Use of a cane, walker or w/c?: No Grab bars in the bathroom?: No Shower chair or bench in shower?: No Elevated toilet seat or a handicapped toilet?: No  TIMED UP AND GO:  Was the test performed?  No  Cognitive Function: 6CIT completed        01/05/2024   11:01 AM 01/04/2023   11:05 AM  6CIT Screen  What Year? 0 points 0 points  What month? 0 points 0 points  What time? 0 points 0 points  Count back from 20 0 points 0 points  Months in reverse 0 points 0 points  Repeat phrase 0 points 0 points  Total Score 0 points 0 points    Immunizations Immunization History  Administered Date(s) Administered   Fluad Quad(high Dose 65+) 06/28/2020, 09/28/2022   Fluad Trivalent(High Dose 65+) 08/18/2023   Influenza,inj,Quad PF,6+ Mos 05/20/2018, 06/23/2019   Td 02/19/2017   Tdap 12/21/2006    Screening Tests Health Maintenance  Topic Date Due   Pneumonia Vaccine 58+ Years old (1 of 2 - PCV) Never done   Colonoscopy  Never done   Zoster Vaccines- Shingrix (1 of 2) Never done   Lung Cancer Screening  10/30/2022   COVID-19 Vaccine (1 - 2024-25 season) Never done   INFLUENZA VACCINE  04/21/2024   Medicare Annual Wellness (AWV)  01/04/2025   DTaP/Tdap/Td (3 - Td or Tdap) 02/20/2027   Hepatitis C Screening  Completed   HPV VACCINES  Aged Out   Meningococcal B Vaccine  Aged Out  Health Maintenance  Health Maintenance Due  Topic Date Due   Pneumonia Vaccine 74+ Years old (1 of 2 - PCV) Never done   Colonoscopy  Never done   Zoster Vaccines- Shingrix (1 of 2) Never done   Lung Cancer Screening  10/30/2022   COVID-19 Vaccine (1 - 2024-25 season) Never done   Health Maintenance Items  Addressed: DECLINES COVID & SHINGLES SHOTS; NEEDS A PNA SHOT, DECLINES COLONOSCOPY, DECLINES LUNG CA SCREENING  Additional Screening:  Vision Screening: Recommended annual ophthalmology exams for early detection of glaucoma and other disorders of the eye.  Dental Screening: Recommended annual dental exams for proper oral hygiene  Community Resource Referral / Chronic Care Management: CRR required this visit?  No   CCM required this visit?  No     Plan:     I have personally reviewed and noted the following in the patient's chart:   Medical and social history Use of alcohol, tobacco or illicit drugs  Current medications and supplements including opioid prescriptions. Patient is not currently taking opioid prescriptions. Functional ability and status Nutritional status Physical activity Advanced directives List of other physicians Hospitalizations, surgeries, and ER visits in previous 12 months Vitals Screenings to include cognitive, depression, and falls Referrals and appointments  In addition, I have reviewed and discussed with patient certain preventive protocols, quality metrics, and best practice recommendations. A written personalized care plan for preventive services as well as general preventive health recommendations were provided to patient.     Pinky Bright, LPN   1/61/0960   After Visit Summary: (MyChart) Due to this being a telephonic visit, the after visit summary with patients personalized plan was offered to patient via MyChart   Notes: Nothing significant to report at this time.

## 2024-01-14 ENCOUNTER — Telehealth: Payer: Self-pay | Admitting: Family Medicine

## 2024-01-14 NOTE — Telephone Encounter (Signed)
 Called patient back and got no answer and voicemail was full so unable to leave message about contacting billing for his concerns at [phone]

## 2024-05-15 ENCOUNTER — Ambulatory Visit: Payer: Self-pay

## 2024-05-15 NOTE — Telephone Encounter (Signed)
 FYI Only or Action Required?: Action required by provider: requesting medication.  Patient was last seen in primary care on 11/30/2023 by Gasper Nancyann BRAVO, MD.  Called Nurse Triage reporting Gastroesophageal Reflux.  Symptoms began several weeks ago.  Interventions attempted: Nothing.  Symptoms are: unchanged.  Triage Disposition: See PCP When Office is Open (Within 3 Days)  Patient/caregiver understands and will follow disposition?: No, wishes to speak with PCP    Reason for Disposition  Nausea lasts > 1 week  Answer Assessment - Initial Assessment Questions 1. NAUSEA SEVERITY: How bad is the nausea? (e.g., mild, moderate, severe; dehydration, weight loss)     moderate 2. ONSET: When did the nausea begin?     X 2 to 3 months 3. VOMITING: Any vomiting? If Yes, ask: How many times today?     yes 4. RECURRENT SYMPTOM: Have you had nausea before? If Yes, ask: When was the last time? What happened that time?     no 5. CAUSE: What do you think is causing the nausea?     reflux 6. PREGNANCY: Is there any chance you are pregnant? (e.g., unprotected intercourse, missed birth control pill, broken condom)     Na   Pt stated was taking OTC medication to help with reflux : pt's wife states he feels fullness, nausea/vomiting.  Would like to request rx for reflux.  Protocols used: Nausea-A-AH

## 2024-05-16 NOTE — Telephone Encounter (Signed)
 Copied from CRM #[id number]. Topic: Clinical - Medication Question >> May 16, 2024 11:39 AM Berwyn MATSU wrote: Reason for CRM: patients wife called in requesting an update on the prescription for acid reflux. Per patients wife it is urgent and needs [facility].   May you please assist.

## 2024-05-18 ENCOUNTER — Telehealth: Payer: Self-pay

## 2024-05-18 NOTE — Telephone Encounter (Signed)
 Called patient's wife informed her that the patient has never been evaluated or treated for acid reflux.  She stated that they already had an appointment at the end of September and that they had gotten the medication OTC, stated she thought it would be okay to wait til then.

## 2024-05-18 NOTE — Telephone Encounter (Signed)
 This is not on his med list or history    Copied from CRM [id number]. Topic: Clinical - Medication Question >> May 17, 2024  4:30 PM Lauren C wrote: Reason for CRM: Wife Eric Hatfield (on dpr) calling in to request that something be sent in for Eric Hatfield's acid reflux. She said he prefers to take Omeprazole and would like this called in for him.

## 2024-05-18 NOTE — Telephone Encounter (Signed)
 Needs to be seen. We haven't evaluated or treated him for reflux before.

## 2024-05-23 MED ORDER — ESOMEPRAZOLE MAGNESIUM 40 MG PO CPDR: 40.0000 mg | DELAYED_RELEASE_CAPSULE | Freq: Every day | ORAL | 0 refills | Status: DC

## 2024-05-23 NOTE — Addendum Note (Signed)
 Addended by: GASPER NANCYANN BRAVO on: 05/23/2024 04:05 PM   Modules accepted: Orders

## 2024-05-24 ENCOUNTER — Other Ambulatory Visit: Payer: Self-pay

## 2024-05-24 DIAGNOSIS — K219 Gastro-esophageal reflux disease without esophagitis: Secondary | ICD-10-CM

## 2024-05-24 MED ORDER — ESOMEPRAZOLE MAGNESIUM 40 MG PO CPDR: 40.0000 mg | DELAYED_RELEASE_CAPSULE | Freq: Every day | ORAL | 0 refills | Status: DC

## 2024-06-16 ENCOUNTER — Telehealth: Payer: Self-pay

## 2024-06-16 MED ORDER — RABEPRAZOLE SODIUM 20 MG PO TBEC: 20.0000 mg | DELAYED_RELEASE_TABLET | Freq: Every day | ORAL | 0 refills | Status: DC

## 2024-06-16 NOTE — Addendum Note (Signed)
 Addended by: GASPER NANCYANN BRAVO on: 06/16/2024 02:13 PM   Modules accepted: Orders

## 2024-06-16 NOTE — Telephone Encounter (Signed)
 Copied from CRM #[id number]. Topic: Clinical - Medication Question >> Jun 16, 2024 11:17 AM Eric Hatfield wrote: Reason for CRM: The patient's wife would like to speak with a member of clinical staff about options for a smaller form of their prescription for esomeprazole  (Eric Hatfield ) 40 MG capsule [[id number]]   Please contact further when possible

## 2024-06-19 ENCOUNTER — Encounter: Payer: Self-pay | Admitting: Family Medicine

## 2024-06-19 ENCOUNTER — Ambulatory Visit (INDEPENDENT_AMBULATORY_CARE_PROVIDER_SITE_OTHER): Admitting: Family Medicine

## 2024-06-19 VITALS — BP 107/78 | HR 115 | Resp 16 | Ht 68.0 in | Wt 148.8 lb

## 2024-06-19 DIAGNOSIS — R1310 Dysphagia, unspecified: Secondary | ICD-10-CM

## 2024-06-19 DIAGNOSIS — Z23 Encounter for immunization: Secondary | ICD-10-CM

## 2024-06-19 MED ORDER — RABEPRAZOLE SODIUM 20 MG PO TBEC: 20.0000 mg | DELAYED_RELEASE_TABLET | Freq: Two times a day (BID) | ORAL | Status: DC

## 2024-06-19 NOTE — Progress Notes (Signed)
      Established patient visit   Patient: Eric Hatfield.   DOB: 30-Nov-1955   68 y.o. Male  MRN: [id number] Visit Date: 06/19/2024  Today's healthcare provider: Nancyann Perry, MD   Chief Complaint  Patient presents with   Medical Management of Chronic Issues   Gastroesophageal Reflux    Pt states not doing good with his acid reflux and not being able to swallow.    Subjective    Discussed the use of AI scribe software for clinical note transcription with the patient, who gave verbal consent to proceed.  History of Present Illness   [name]. is a 68 year old male who presents with dysphagia and gastroesophageal reflux disease (GERD).  He has been experiencing a sensation of food and medication getting stuck in his throat daily for the past month. There is no burning sensation when swallowing or eating. He has been taking Aciphex  daily for his reflux, but the symptoms persist.       Medications: Outpatient Medications Prior to Visit  Medication Sig   divalproex  (DEPAKOTE ) 500 MG DR tablet Take 1 tablet (500 mg total) by mouth 2 (two) times daily.   escitalopram  (LEXAPRO ) 10 MG tablet Take 1 tablet (10 mg total) by mouth daily after breakfast.   fluticasone  (FLONASE ) 50 MCG/ACT nasal spray Place 1 spray into both nostrils daily.   irbesartan  (AVAPRO ) 75 MG tablet TAKE 1 TABLET BY MOUTH DAILY.   risperiDONE  (RISPERDAL ) 3 MG tablet Take 1 tablet (3 mg total) by mouth daily.   rosuvastatin  (CRESTOR ) 5 MG tablet TAKE ONE TABLET BY MOUTH ONCE DAILY   [DISCONTINUED] RABEprazole  (ACIPHEX ) 20 MG tablet Take 1 tablet (20 mg total) by mouth daily.   No facility-administered medications prior to visit.   Review of Systems  Constitutional:  Negative for appetite change, chills and fever.  Respiratory:  Negative for chest tightness, shortness of breath and wheezing.   Cardiovascular:  Negative for chest pain and palpitations.  Gastrointestinal:  Negative for abdominal  pain, nausea and vomiting.       Objective    BP 107/78   Pulse (!) 115   Resp 16   Ht 5' 8 (1.727 m)   Wt 148 lb 12.8 oz (67.5 kg)   SpO2 98%   BMI 22.62 kg/m   Physical Exam   General appearance: Well developed, well nourished male, cooperative and in no acute distress Head: Normocephalic, without obvious abnormality, atraumatic Respiratory: Respirations even and unlabored, normal respiratory rate Extremities: All extremities are intact.  Skin: Skin color, texture, turgor normal. No rashes seen  Psych: Appropriate mood and affect. Neurologic: Mental status: Alert, oriented to person, place, and time, thought content appropriate.    Assessment & Plan        Dysphagia with associated weight loss and difficulty swallowing Depakote  extended-release tablets Dysphagia likely due to esophageal stricture causing obstruction. Extended-release Depakote  tablets should not be crushed or cut. - Refer to gastroenterologist at [facility] for evaluation and possible esophageal dilation. - Continue current medications as prescribed.  Gastroesophageal reflux disease (GERD) Chronic GERD with recent exacerbation. Potential esophageal stricture secondary to chronic inflammation from GERD. - Continue Aciphex  daily, consider increasing to twice daily if symptoms persist, pending insurance coverage.    No follow-ups on file.     Nancyann Perry, MD  [facility] [phone] (phone) [phone] (fax)  [facility]

## 2024-06-19 NOTE — Patient Instructions (Signed)
 SABRA  Please review the attached list of medications and notify my office if there are any errors.   . Please bring all of your medications to every appointment so we can make sure that our medication list is the same as yours.

## 2024-06-22 ENCOUNTER — Telehealth: Payer: Self-pay

## 2024-06-22 NOTE — Telephone Encounter (Signed)
 The patient's wife left a voicemail requesting a call regarding the referral. I attempted to contact the patient, but the call went to voicemail and I was unable to leave a message.

## 2024-07-09 NOTE — Progress Notes (Deleted)
 07/09/2024 [name] [id number] Apr 20, 1956  Gastroenterology Office Note    Referring Provider: Gasper Nancyann BRAVO, MD Primary Care Physician:  Gasper Nancyann BRAVO, MD  Primary GI Provider: Jinny Carmine, MD    Chief Complaint   No chief complaint on file.    History of Present Illness   Eric Hatfield. is a 68 y.o. male with PMHX of *** , presenting today at the request of Fisher, Nancyann BRAVO, MD due to GERD and dysphagia.    Patient seen by PCP on 06/19/2024 with complaints of food and medication getting stuck in his throat. Taking Aciphex  daily for reflux but symptoms persists.   Weight loss?? Colonoscopy??  Past Medical History:  Diagnosis Date   Bipolar disorder (HCC)    Hypertension     Past Surgical History:  Procedure Laterality Date   Impacted tooth      Current Outpatient Medications  Medication Sig Dispense Refill   divalproex  (DEPAKOTE ) 500 MG DR tablet Take 1 tablet (500 mg total) by mouth 2 (two) times daily. 60 tablet 3   escitalopram  (LEXAPRO ) 10 MG tablet Take 1 tablet (10 mg total) by mouth daily after breakfast. 30 tablet 3   fluticasone  (FLONASE ) 50 MCG/ACT nasal spray Place 1 spray into both nostrils daily. 16 g 0   irbesartan  (AVAPRO ) 75 MG tablet TAKE 1 TABLET BY MOUTH DAILY. 90 tablet 4   RABEprazole  (ACIPHEX ) 20 MG tablet Take 1 tablet (20 mg total) by mouth 2 (two) times daily.     risperiDONE  (RISPERDAL ) 3 MG tablet Take 1 tablet (3 mg total) by mouth daily. 30 tablet 3   rosuvastatin  (CRESTOR ) 5 MG tablet TAKE ONE TABLET BY MOUTH ONCE DAILY 90 tablet 1   No current facility-administered medications for this visit.    Allergies as of 07/10/2024   (No Known Allergies)    Family History  Problem Relation Age of Onset   Hypertension Mother    Hyperlipidemia Father    Hypertension Brother    Mental illness Paternal Grandmother     Social History   Socioeconomic History   Marital status: Married    Spouse name: Eric Hatfield   Number of children: Not on file   Years of education: Not on file   Highest education level: Not on file  Occupational History   Not on file  Tobacco Use   Smoking status: Every Day    Current packs/day: 0.75    Average packs/day: 0.8 packs/day for 49.1 years (36.8 ttl pk-yrs)    Types: Cigarettes    Start date: 05/31/1975   Smokeless tobacco: Never   Tobacco comments:    started smoking as a teenager average 1 ppd  Vaping Use   Vaping status: Never Used  Substance and Sexual Activity   Alcohol use: Not Currently    Comment: quit drking in his 64s.    Drug use: Not Currently   Sexual activity: Yes    Partners: Female    Birth control/protection: None  Other Topics Concern   Not on file  Social History Narrative   Not on file   Social Drivers of Health   Financial Resource Strain: Low Risk  (01/05/2024)   Overall Financial Resource Strain (CARDIA)    Difficulty of Paying Living Expenses: Not very hard  Food Insecurity: No Food Insecurity (01/05/2024)   Hunger Vital Sign    Worried About Running Out of Food in the Last Year: Never true  Ran Out of Food in the Last Year: Never true  Transportation Needs: No Transportation Needs (01/05/2024)   PRAPARE - Administrator, Civil Service (Medical): No    Lack of Transportation (Non-Medical): No  Physical Activity: Insufficiently Active (01/05/2024)   Exercise Vital Sign    Days of Exercise per Week: 2 days    Minutes of Exercise per Session: 20 min  Stress: No Stress Concern Present (01/05/2024)   [employer] of Occupational Health - Occupational Stress Questionnaire    Feeling of Stress : Not at all  Social Connections: Moderately Isolated (01/05/2024)   Social Connection and Isolation Panel    Frequency of Communication with Friends and Family: Twice a week    Frequency of Social Gatherings with Friends and Family: Once a week    Attends Religious Services: Never    Database administrator or  Organizations: No    Attends Banker Meetings: Never    Marital Status: Married  Catering manager Violence: Not At Risk (01/05/2024)   Humiliation, Afraid, Rape, and Kick questionnaire    Fear of Current or Ex-Partner: No    Emotionally Abused: No    Physically Abused: No    Sexually Abused: No     RELEVANT GI HISTORY, IMAGING AND LABS: CBC    Component Value Date/Time   WBC 6.5 08/18/2023 1035   WBC 6.7 05/02/2022 1102   RBC 4.47 08/18/2023 1035   RBC 4.30 05/02/2022 1102   HGB 14.8 08/18/2023 1035   HCT 42.5 08/18/2023 1035   PLT 247 08/18/2023 1035   MCV 95 08/18/2023 1035   MCH 33.1 (H) 08/18/2023 1035   MCH 33.3 05/02/2022 1102   MCHC 34.8 08/18/2023 1035   MCHC 33.6 05/02/2022 1102   RDW 12.4 08/18/2023 1035   LYMPHSABS 1.1 03/29/2020 0730   MONOABS 0.5 04/27/2015 0744   EOSABS 0.1 03/29/2020 0730   BASOSABS 0.1 03/29/2020 0730   Recent Labs    08/18/23 1035  HGB 14.8    CMP     Component Value Date/Time   NA 132 (L) 11/30/2023 1112   K 4.4 11/30/2023 1112   CL 95 (L) 11/30/2023 1112   CO2 21 11/30/2023 1112   GLUCOSE 156 (H) 11/30/2023 1112   GLUCOSE 112 (H) 05/02/2022 1102   BUN 10 11/30/2023 1112   CREATININE 0.90 11/30/2023 1112   CALCIUM  9.4 11/30/2023 1112   PROT 6.1 11/30/2023 1112   ALBUMIN 3.8 (L) 11/30/2023 1112   AST 11 11/30/2023 1112   ALT 8 11/30/2023 1112   ALKPHOS 114 11/30/2023 1112   BILITOT 0.3 11/30/2023 1112   GFRNONAA >60 05/02/2022 1102   GFRAA 106 07/19/2020 1136      Latest Ref Rng & Units 11/30/2023   11:12 AM 08/18/2023   10:35 AM 05/02/2022   11:02 AM  Hepatic Function  Total Protein 6.0 - 8.5 g/dL 6.1  6.7  7.3   Albumin 3.9 - 4.9 g/dL 3.8  4.1  3.9   AST 0 - 40 IU/L 11  15  18    ALT 0 - 44 IU/L 8  10  12    Alk Phosphatase 44 - 121 IU/L 114  113  82   Total Bilirubin 0.0 - 1.2 mg/dL 0.3  0.3  0.5       Review of Systems   All systems reviewed and negative except where noted in HPI.     Physical Exam  There were no vitals taken for this  visit. No LMP for male patient. General:   Alert and oriented. Pleasant and cooperative. Well-nourished and well-developed.  Head:  Normocephalic and atraumatic. Eyes:  Without icterus Ears:  Normal auditory acuity. Neck:  Supple; no masses or thyromegaly. Lungs:  Respirations even and unlabored.  Clear throughout to auscultation.   No wheezes, crackles, or rhonchi. No acute distress. Heart:  Regular rate and rhythm; no murmurs, clicks, rubs, or gallops. Abdomen:  Normal bowel sounds.  No bruits.  Soft, non-tender and non-distended without masses, hepatosplenomegaly or hernias noted.  No guarding or rebound tenderness.  ***Negative Carnett sign.   Rectal:  Deferred.***  Msk:  Symmetrical without gross deformities. Normal posture. Extremities:  Without edema. Neurologic:  Alert and  oriented x4;  grossly normal neurologically. Skin:  Intact without significant lesions or rashes. Psych:  Alert and cooperative. Normal mood and affect.   Assessment & Plan   Eric A Markus Casten. is a 68 y.o. male presenting today with    I discussed the assessment and treatment plan with the patient. The patient was provided an opportunity to ask questions and all were answered. The patient agreed with the plan and demonstrated an understanding of the instructions.   The patient was advised to call back or seek an in-person evaluation if the symptoms worsen or if the condition fails to improve as anticipated.  Eric Bohr, DNP, AGNP-C [facility]

## 2024-07-10 ENCOUNTER — Ambulatory Visit: Admitting: Family Medicine

## 2024-07-21 ENCOUNTER — Ambulatory Visit: Payer: Self-pay

## 2024-07-21 ENCOUNTER — Telehealth: Payer: Self-pay

## 2024-07-21 DIAGNOSIS — R1314 Dysphagia, pharyngoesophageal phase: Secondary | ICD-10-CM

## 2024-07-21 NOTE — Telephone Encounter (Signed)
 FYI Only or Action Required?: Action required by provider: clinical question for provider and update on patient condition.  Patient was last seen in primary care on 06/19/2024 by Gasper Nancyann BRAVO, MD.  Called Nurse Triage reporting Gastroesophageal Reflux.  Symptoms began about a month ago.  Symptoms are: gradually worsening.  Triage Disposition: Call PCP Now  Patient/caregiver understands and will follow disposition?: Yes      Copied from CRM #[id number]. Topic: Clinical - Red Word Triage >> Jul 21, 2024  2:01 PM Selinda RAMAN wrote: Red Word that prompted transfer to Nurse Triage: The spouse of the patient called stating her husband is really struggling. She says he cannot hold food down and spits up after ever meal. She says he has a lot of phlegm and throws up food too. He was prescribed RABEprazole  (ACIPHEX ) 20 MG tablet but stopped taking it after a few days stating it is not helping. I will transfer her to E2C2 NT      Reason for Disposition  [1] Caller has URGENT medicine question about med that primary care doctor (or NP/PA) or specialist prescribed AND [2] triager unable to answer question  Answer Assessment - Initial Assessment Questions Patient's wife states that the patient has stopped taking the medication stating it wasn't helping. She states the bottle shows to take it 1 time a day but the medication list shows 2 times daily, and she would like clarification for how often he should take it. Patient states he will start taking the medication again and has declined an appointment at this time.      1. NAME of MEDICINE: What medicine(s) are you calling about?     RABEprazole  (ACIPHEX ) 20 MG tablet  2. QUESTION: What is your question? (e.g., double dose of medicine, side effect)     Patient stopped taking it stating it wasn't working, wife would like to know if he should continue to take it 3. PRESCRIBER: Who prescribed the medicine? Reason: if prescribed by specialist,  call should be referred to that group.     Dr. Gasper  4. SYMPTOMS: Do you have any symptoms? If Yes, ask: What symptoms are you having?  How bad are the symptoms (e.g., mild, moderate, severe)     Vomiting  Protocols used: Medication Question Call-A-AH

## 2024-07-21 NOTE — Telephone Encounter (Signed)
 Copied from CRM #[id number]. Topic: Clinical - Medical Advice >> Jul 21, 2024  1:44 PM Delon DASEN wrote: Reason for CRM: Wife calling about patient's acid reducer, not working, patient needs something else [phone]

## 2024-07-24 NOTE — Telephone Encounter (Signed)
 Called patient's wife. NA call cannot be completed at this time.

## 2024-07-24 NOTE — Addendum Note (Signed)
 Addended by: GASPER GOLAS E on: 07/24/2024 12:59 PM   Modules accepted: Orders

## 2024-07-24 NOTE — Telephone Encounter (Signed)
 He needs barium swallow test done, have placed order.

## 2024-07-25 NOTE — Telephone Encounter (Addendum)
 Test scheduled 07/28/2024  9:00 AM at [facility]. Patient's wife advised.

## 2024-07-25 NOTE — Telephone Encounter (Signed)
 Please see other telephone encounter.

## 2024-07-25 NOTE — Telephone Encounter (Signed)
 He has an appointment in Dec at [facility] He is saying he does not want to go for swallowing test. His wife asked that someone in office call and explain to him why it needs to be done.   Called and NA, but when I spoke with patient's wife she was told is necessary to identify the cause of a swallowing disorder and to treat.

## 2024-07-25 NOTE — Telephone Encounter (Signed)
Called patients wife and advised

## 2024-07-28 ENCOUNTER — Telehealth: Payer: Self-pay | Admitting: Speech Pathology

## 2024-07-28 ENCOUNTER — Other Ambulatory Visit

## 2024-07-28 ENCOUNTER — Other Ambulatory Visit: Payer: Self-pay | Admitting: Family Medicine

## 2024-07-28 ENCOUNTER — Inpatient Hospital Stay: Admission: RE | Admit: 2024-07-28 | Source: Ambulatory Visit

## 2024-07-28 DIAGNOSIS — R1310 Dysphagia, unspecified: Secondary | ICD-10-CM

## 2024-07-28 NOTE — Telephone Encounter (Signed)
 Pt with order for Modified Barium Swallow Study today at 11am. In reviewing pt's chart, his PCP states He needs barium swallow test done, have placed order (07/24/2024).   This clinical research associate sent secure chat to pt's PCP, as well as called his office and spoke with him. Education provided on differences between a Modified Barium and a Barium Swallow. PCP states a barium swallow was desired. Information provided on order for barium swallow with plan to change appt from a Modified Barium to a barium swallow for assessment of esophageal function. Exact order for a barium swallow was also sent via secure chat to PCP for help in placing order.   Eric Hatfield, M.S., CCC-SLP, Tree Surgeon Certified Brain Injury Specialist [facility]  [facility] Office [phone] Ascom [phone] Fax [phone]

## 2024-07-31 ENCOUNTER — Other Ambulatory Visit: Payer: Self-pay | Admitting: Family Medicine

## 2024-08-03 ENCOUNTER — Telehealth: Payer: Self-pay | Admitting: Family Medicine

## 2024-08-03 ENCOUNTER — Other Ambulatory Visit: Payer: Self-pay | Admitting: Family Medicine

## 2024-08-03 NOTE — Telephone Encounter (Signed)
 Copied from CRM [id number]. Topic: Appointments - Scheduling Inquiry for Clinic >> Aug 02, 2024 11:22 AM Winona R wrote: Pt and wife called to confirm if the pt has any appointments coming up. Pt is not scheduled wirh DR. Fisher but would like to schedule however they are not sure if it should be 4/ 5/ or 6 month follow up. Please contact the pt to enlighten him. Previous appointments are a mixed of time frames.

## 2024-08-03 NOTE — Telephone Encounter (Signed)
 Need to schedule follow up appointment first or second week of March

## 2024-08-07 ENCOUNTER — Telehealth: Payer: Self-pay | Admitting: Family Medicine

## 2024-08-07 NOTE — Telephone Encounter (Signed)
 Received call from Dr. Chipper that patient fell at home and is having trouble getting up and around. Patient needs to be evaluated and have his sodium levels checked. He either needs to come into office or go to ER.  I'm out of office until Wednesday but there are appts available with Burnard and Dr. Franchot. You can also use one of my SameDay slots on Wednesday if he doesn't get evaluated before then.

## 2024-08-08 ENCOUNTER — Ambulatory Visit: Payer: Self-pay

## 2024-08-08 NOTE — Telephone Encounter (Signed)
 FYI Only or Action Required?: FYI only for provider: appointment scheduled on 08/09/2024 at 9:40 AM.  Patient was last seen in primary care on 06/19/2024 by Eric Nancyann BRAVO, MD.  Called Nurse Triage reporting Constipation.  Symptoms began several weeks ago.  Interventions attempted: Rest, hydration, or home remedies.  Symptoms are: unchanged.  Triage Disposition: See Physician Within 24 Hours  Patient/caregiver understands and will follow disposition?: Yes  Copied from CRM #[id number]. Topic: Clinical - Red Word Triage >> Aug 08, 2024  2:43 PM Mia F wrote: Red Word that prompted transfer to Nurse Triage: Not having bowel movements as he should, he wakes up and says ouch but not sure whats hurting. When he eat he spits up. She says he isnt able to eat much. Has been going on for a while but seems to be getting worse. Reason for Disposition  Last bowel movement (BM) > 4 days ago  Answer Assessment - Initial Assessment Questions Patient's wife called to report patient having periods of constipation. Reports patient did have a bowel movement yesterday but wife states patient can go several days without a bowel movement. Endorses acid reflux and little appetite. Wife states patient is on medication that is making him very fatigued. Patient is scheduled to see another provider tomorrow at 9:40 AM.   1. STOOL PATTERN OR FREQUENCY: How often do you have a bowel movement (BM)?  (Normal range: 3 times a day to every 3 days)  When was your last BM?       Wife thinks patient had a bowel movement yesterday but states patient can go several days without having one.  2. STRAINING: Do you have to strain to have a BM?      no 3. ONSET: When did the constipation begin?     About a month ago 4. RECTAL PAIN: Does your rectum hurt when the stool comes out? If Yes, ask: Do you have hemorrhoids? How bad is the pain?  (Scale 1-10; or mild, moderate, severe)     Patient doesn't say if he is having  pain 5. BM COMPOSITION: Are the stools hard?      no 6. BLOOD ON STOOLS: Has there been any blood on the toilet tissue or on the surface of the BM? If Yes, ask: When was the last time?     Small amount 7. CHRONIC CONSTIPATION: Is this a new problem for you?  If No, ask: How long have you had this problem? (days, weeks, months)      yes 8. CHANGES IN DIET OR HYDRATION: Have there been any recent changes in your diet? How much fluids are you drinking on a daily basis?  How much have you had to drink today?     Yes-not eating as much as normal.  9. MEDICINES: Have you been taking any new medicines? Are you taking any narcotic pain medicines? (e.g., Dilaudid, morphine, Percocet, Vicodin)     No new medications 10. LAXATIVES: Have you been using any stool softeners, laxatives, or enemas?  If Yes, ask What are you using, how often, and when was the last time?       no 11. ACTIVITY:  How much walking do you do every day?  Has your activity level decreased in the past week?        no 12. CAUSE: What do you think is causing the constipation?        unsure 13. MEDICAL HISTORY: Do you have a history of hemorrhoids, rectal  fissures, rectal surgery, or rectal abscess?         no 14. OTHER SYMPTOMS: Do you have any other symptoms? (e.g., abdomen pain, bloating, fever, vomiting)       Acid reflux, no appetite  Protocols used: Constipation-A-AH

## 2024-08-09 ENCOUNTER — Ambulatory Visit: Admitting: Family Medicine

## 2024-08-09 NOTE — Telephone Encounter (Signed)
 Called [facility]mir number mb is full unable to lvm. Called wife number line kept ringing.

## 2024-08-09 NOTE — Telephone Encounter (Signed)
Pt cancelled appt for today

## 2024-08-09 NOTE — Telephone Encounter (Signed)
 FYI Only or Action Required?: FYI only for provider: appointment scheduled on 08/09/2024 at 9:40 AM.     Appointment was for today with Eric Hatfield patient canceled.

## 2024-08-11 ENCOUNTER — Emergency Department

## 2024-08-11 ENCOUNTER — Inpatient Hospital Stay
Admission: EM | Admit: 2024-08-11 | Discharge: 2024-08-21 | DRG: 871 | Disposition: E | Attending: Internal Medicine | Admitting: Internal Medicine

## 2024-08-11 DIAGNOSIS — F79 Unspecified intellectual disabilities: Secondary | ICD-10-CM | POA: Diagnosis present

## 2024-08-11 DIAGNOSIS — J9602 Acute respiratory failure with hypercapnia: Secondary | ICD-10-CM | POA: Diagnosis present

## 2024-08-11 DIAGNOSIS — J189 Pneumonia, unspecified organism: Secondary | ICD-10-CM | POA: Diagnosis present

## 2024-08-11 DIAGNOSIS — E8809 Other disorders of plasma-protein metabolism, not elsewhere classified: Secondary | ICD-10-CM | POA: Diagnosis present

## 2024-08-11 DIAGNOSIS — E872 Acidosis, unspecified: Secondary | ICD-10-CM | POA: Diagnosis present

## 2024-08-11 DIAGNOSIS — R1310 Dysphagia, unspecified: Secondary | ICD-10-CM | POA: Diagnosis present

## 2024-08-11 DIAGNOSIS — F319 Bipolar disorder, unspecified: Secondary | ICD-10-CM

## 2024-08-11 DIAGNOSIS — Z66 Do not resuscitate: Secondary | ICD-10-CM | POA: Diagnosis present

## 2024-08-11 DIAGNOSIS — R578 Other shock: Secondary | ICD-10-CM | POA: Diagnosis present

## 2024-08-11 DIAGNOSIS — I10 Essential (primary) hypertension: Secondary | ICD-10-CM | POA: Diagnosis present

## 2024-08-11 DIAGNOSIS — R739 Hyperglycemia, unspecified: Secondary | ICD-10-CM | POA: Diagnosis present

## 2024-08-11 DIAGNOSIS — E876 Hypokalemia: Secondary | ICD-10-CM | POA: Diagnosis present

## 2024-08-11 DIAGNOSIS — Z9181 History of falling: Secondary | ICD-10-CM

## 2024-08-11 DIAGNOSIS — D696 Thrombocytopenia, unspecified: Secondary | ICD-10-CM | POA: Diagnosis present

## 2024-08-11 DIAGNOSIS — W44F3XA Food entering into or through a natural orifice, initial encounter: Secondary | ICD-10-CM | POA: Diagnosis present

## 2024-08-11 DIAGNOSIS — J9601 Acute respiratory failure with hypoxia: Secondary | ICD-10-CM | POA: Diagnosis present

## 2024-08-11 DIAGNOSIS — M4854XA Collapsed vertebra, not elsewhere classified, thoracic region, initial encounter for fracture: Secondary | ICD-10-CM | POA: Diagnosis present

## 2024-08-11 DIAGNOSIS — Z9889 Other specified postprocedural states: Secondary | ICD-10-CM

## 2024-08-11 DIAGNOSIS — R579 Shock, unspecified: Secondary | ICD-10-CM

## 2024-08-11 DIAGNOSIS — D649 Anemia, unspecified: Secondary | ICD-10-CM | POA: Diagnosis present

## 2024-08-11 DIAGNOSIS — Z8249 Family history of ischemic heart disease and other diseases of the circulatory system: Secondary | ICD-10-CM

## 2024-08-11 DIAGNOSIS — J811 Chronic pulmonary edema: Secondary | ICD-10-CM | POA: Diagnosis present

## 2024-08-11 DIAGNOSIS — Z79899 Other long term (current) drug therapy: Secondary | ICD-10-CM

## 2024-08-11 DIAGNOSIS — J90 Pleural effusion, not elsewhere classified: Secondary | ICD-10-CM | POA: Diagnosis present

## 2024-08-11 DIAGNOSIS — R918 Other nonspecific abnormal finding of lung field: Secondary | ICD-10-CM | POA: Diagnosis present

## 2024-08-11 DIAGNOSIS — Z6822 Body mass index (BMI) 22.0-22.9, adult: Secondary | ICD-10-CM

## 2024-08-11 DIAGNOSIS — R296 Repeated falls: Secondary | ICD-10-CM | POA: Diagnosis present

## 2024-08-11 DIAGNOSIS — N179 Acute kidney failure, unspecified: Secondary | ICD-10-CM | POA: Diagnosis not present

## 2024-08-11 DIAGNOSIS — G928 Other toxic encephalopathy: Secondary | ICD-10-CM | POA: Diagnosis present

## 2024-08-11 DIAGNOSIS — E43 Unspecified severe protein-calorie malnutrition: Secondary | ICD-10-CM | POA: Diagnosis present

## 2024-08-11 DIAGNOSIS — F17210 Nicotine dependence, cigarettes, uncomplicated: Secondary | ICD-10-CM | POA: Diagnosis present

## 2024-08-11 DIAGNOSIS — K219 Gastro-esophageal reflux disease without esophagitis: Secondary | ICD-10-CM | POA: Diagnosis present

## 2024-08-11 DIAGNOSIS — T17928A Food in respiratory tract, part unspecified causing other injury, initial encounter: Secondary | ICD-10-CM | POA: Diagnosis present

## 2024-08-11 DIAGNOSIS — K224 Dyskinesia of esophagus: Secondary | ICD-10-CM | POA: Diagnosis present

## 2024-08-11 DIAGNOSIS — J439 Emphysema, unspecified: Secondary | ICD-10-CM | POA: Diagnosis present

## 2024-08-11 DIAGNOSIS — R6521 Severe sepsis with septic shock: Secondary | ICD-10-CM | POA: Diagnosis present

## 2024-08-11 DIAGNOSIS — Z515 Encounter for palliative care: Secondary | ICD-10-CM

## 2024-08-11 DIAGNOSIS — A419 Sepsis, unspecified organism: Principal | ICD-10-CM | POA: Diagnosis present

## 2024-08-11 DIAGNOSIS — I350 Nonrheumatic aortic (valve) stenosis: Secondary | ICD-10-CM | POA: Diagnosis present

## 2024-08-11 DIAGNOSIS — J440 Chronic obstructive pulmonary disease with acute lower respiratory infection: Secondary | ICD-10-CM | POA: Diagnosis present

## 2024-08-11 DIAGNOSIS — I2489 Other forms of acute ischemic heart disease: Secondary | ICD-10-CM | POA: Diagnosis present

## 2024-08-11 DIAGNOSIS — I4891 Unspecified atrial fibrillation: Secondary | ICD-10-CM | POA: Diagnosis present

## 2024-08-11 DIAGNOSIS — E871 Hypo-osmolality and hyponatremia: Secondary | ICD-10-CM | POA: Diagnosis present

## 2024-08-11 LAB — LIPASE, BLOOD: Lipase: 16 U/L (ref 11–51)

## 2024-08-11 LAB — RESP PANEL BY RT-PCR (RSV, FLU A&B, COVID)  RVPGX2
Influenza A by PCR: NEGATIVE
Influenza B by PCR: NEGATIVE
Resp Syncytial Virus by PCR: NEGATIVE
SARS Coronavirus 2 by RT PCR: NEGATIVE

## 2024-08-11 LAB — CBC WITH DIFFERENTIAL/PLATELET
Abs Immature Granulocytes: 0.47 K/uL — ABNORMAL HIGH (ref 0.00–0.07)
Basophils Absolute: 0.1 K/uL (ref 0.0–0.1)
Basophils Relative: 0 %
Eosinophils Absolute: 0 K/uL (ref 0.0–0.5)
Eosinophils Relative: 0 %
HCT: 28 % — ABNORMAL LOW (ref 39.0–52.0)
Hemoglobin: 10.1 g/dL — ABNORMAL LOW (ref 13.0–17.0)
Immature Granulocytes: 3 %
Lymphocytes Relative: 4 %
Lymphs Abs: 0.6 K/uL — ABNORMAL LOW (ref 0.7–4.0)
MCH: 33.3 pg (ref 26.0–34.0)
MCHC: 36.1 g/dL — ABNORMAL HIGH (ref 30.0–36.0)
MCV: 92.4 fL (ref 80.0–100.0)
Monocytes Absolute: 1 K/uL (ref 0.1–1.0)
Monocytes Relative: 6 %
Neutro Abs: 14.6 K/uL — ABNORMAL HIGH (ref 1.7–7.7)
Neutrophils Relative %: 87 %
Platelets: 114 K/uL — ABNORMAL LOW (ref 150–400)
RBC: 3.03 MIL/uL — ABNORMAL LOW (ref 4.22–5.81)
RDW: 15.8 % — ABNORMAL HIGH (ref 11.5–15.5)
WBC: 16.8 K/uL — ABNORMAL HIGH (ref 4.0–10.5)
nRBC: 0 % (ref 0.0–0.2)

## 2024-08-11 LAB — COMPREHENSIVE METABOLIC PANEL WITH GFR
ALT: 7 U/L (ref 0–44)
AST: 24 U/L (ref 15–41)
Albumin: 2.7 g/dL — ABNORMAL LOW (ref 3.5–5.0)
Alkaline Phosphatase: 73 U/L (ref 38–126)
Anion gap: 16 — ABNORMAL HIGH (ref 5–15)
BUN: 59 mg/dL — ABNORMAL HIGH (ref 8–23)
CO2: 27 mmol/L (ref 22–32)
Calcium: 8.9 mg/dL (ref 8.9–10.3)
Chloride: 87 mmol/L — ABNORMAL LOW (ref 98–111)
Creatinine, Ser: 1.66 mg/dL — ABNORMAL HIGH (ref 0.61–1.24)
GFR, Estimated: 45 mL/min — ABNORMAL LOW (ref >60)
Glucose, Bld: 107 mg/dL — ABNORMAL HIGH (ref 70–99)
Potassium: 2.9 mmol/L — ABNORMAL LOW (ref 3.5–5.1)
Sodium: 131 mmol/L — ABNORMAL LOW (ref 135–145)
Total Bilirubin: 0.9 mg/dL (ref 0.0–1.2)
Total Protein: 6.4 g/dL — ABNORMAL LOW (ref 6.5–8.1)

## 2024-08-11 LAB — TROPONIN T, HIGH SENSITIVITY
Troponin T High Sensitivity: 44 ng/L — ABNORMAL HIGH (ref 0–19)
Troponin T High Sensitivity: 39 ng/L — ABNORMAL HIGH (ref 0–19)

## 2024-08-11 LAB — GLUCOSE, CAPILLARY: Glucose-Capillary: 112 mg/dL — ABNORMAL HIGH (ref 70–99)

## 2024-08-11 LAB — VALPROIC ACID LEVEL: Valproic Acid Lvl: 85 ug/mL (ref 50–100)

## 2024-08-11 LAB — LACTIC ACID, PLASMA
Lactic Acid, Venous: 2.5 mmol/L (ref 0.5–1.9)
Lactic Acid, Venous: 1.5 mmol/L (ref 0.5–1.9)

## 2024-08-11 LAB — PHOSPHORUS: Phosphorus: 3.6 mg/dL (ref 2.5–4.6)

## 2024-08-11 LAB — MAGNESIUM: Magnesium: 2.3 mg/dL (ref 1.7–2.4)

## 2024-08-11 LAB — MRSA NEXT GEN BY PCR, NASAL: MRSA by PCR Next Gen: NOT DETECTED

## 2024-08-11 LAB — PROCALCITONIN: Procalcitonin: 1.1 ng/mL

## 2024-08-11 MED ORDER — DOCUSATE SODIUM 100 MG PO CAPS
100.0000 mg | ORAL_CAPSULE | Freq: Two times a day (BID) | ORAL | Status: DC | PRN
Start: 2024-08-11 — End: 2024-08-16

## 2024-08-11 MED ORDER — POLYETHYLENE GLYCOL 3350 17 G PO PACK: 17.0000 g | PACK | Freq: Every day | ORAL | Status: DC | PRN

## 2024-08-11 MED ORDER — PANTOPRAZOLE SODIUM 40 MG PO TBEC
40.0000 mg | DELAYED_RELEASE_TABLET | Freq: Every day | ORAL | Status: DC
  Administered 2024-08-11: 40 mg via ORAL
  Filled 2024-08-11: qty 1

## 2024-08-11 MED ORDER — SODIUM CHLORIDE 0.9 % IV SOLN: 2.0000 g | INTRAVENOUS | Status: DC

## 2024-08-11 MED ORDER — LACTATED RINGERS IV BOLUS
1000.0000 mL | Freq: Once | INTRAVENOUS | Status: AC
  Administered 2024-08-11: 1000 mL via INTRAVENOUS

## 2024-08-11 MED ORDER — POTASSIUM CHLORIDE 10 MEQ/100ML IV SOLN
10.0000 meq | INTRAVENOUS | Status: AC
Start: 2024-08-11 — End: 2024-08-11
  Administered 2024-08-11 (×2): 10 meq via INTRAVENOUS
  Filled 2024-08-11: qty 100

## 2024-08-11 MED ORDER — ESCITALOPRAM OXALATE 10 MG PO TABS
5.0000 mg | ORAL_TABLET | Freq: Every day | ORAL | Status: DC
  Administered 2024-08-13: 5 mg via ORAL
  Filled 2024-08-11 (×3): qty 0.5

## 2024-08-11 MED ORDER — POTASSIUM CHLORIDE 10 MEQ/100ML IV SOLN
10.0000 meq | INTRAVENOUS | Status: AC
Start: 2024-08-11 — End: 2024-08-11
  Administered 2024-08-11 (×2): 10 meq via INTRAVENOUS
  Filled 2024-08-11 (×3): qty 100

## 2024-08-11 MED ORDER — CHLORHEXIDINE GLUCONATE CLOTH 2 % EX PADS
6.0000 | MEDICATED_PAD | Freq: Every day | CUTANEOUS | Status: DC
  Administered 2024-08-11 – 2024-08-16 (×6): 6 via TOPICAL
  Filled 2024-08-11: qty 6

## 2024-08-11 MED ORDER — HYDROCORTISONE SOD SUC (PF) 100 MG IJ SOLR
100.0000 mg | Freq: Two times a day (BID) | INTRAMUSCULAR | Status: DC
  Administered 2024-08-11 – 2024-08-16 (×10): 100 mg via INTRAVENOUS
  Filled 2024-08-11 (×12): qty 2

## 2024-08-11 MED ORDER — HEPARIN SODIUM (PORCINE) 5000 UNIT/ML IJ SOLN
5000.0000 [IU] | Freq: Three times a day (TID) | INTRAMUSCULAR | Status: DC
  Administered 2024-08-11 – 2024-08-13 (×5): 5000 [IU] via SUBCUTANEOUS
  Filled 2024-08-11 (×6): qty 1

## 2024-08-11 MED ORDER — RISPERIDONE 1 MG PO TABS
1.0000 mg | ORAL_TABLET | Freq: Every day | ORAL | Status: DC
  Administered 2024-08-11 – 2024-08-15 (×4): 1 mg via ORAL
  Filled 2024-08-11 (×5): qty 1

## 2024-08-11 MED ORDER — SODIUM CHLORIDE 0.9 % IV SOLN
500.0000 mg | Freq: Once | INTRAVENOUS | Status: AC
  Administered 2024-08-11: 500 mg via INTRAVENOUS
  Filled 2024-08-11: qty 5

## 2024-08-11 MED ORDER — SODIUM CHLORIDE 0.9 % IV SOLN
500.0000 mg | INTRAVENOUS | Status: AC
  Administered 2024-08-12 – 2024-08-13 (×2): 500 mg via INTRAVENOUS
  Filled 2024-08-11 (×2): qty 5

## 2024-08-11 MED ORDER — SODIUM CHLORIDE 0.9 % IV SOLN
3.0000 g | Freq: Four times a day (QID) | INTRAVENOUS | Status: DC
  Administered 2024-08-11 – 2024-08-12 (×4): 3 g via INTRAVENOUS
  Filled 2024-08-11 (×5): qty 8

## 2024-08-11 MED ORDER — LACTATED RINGERS IV BOLUS
500.0000 mL | Freq: Once | INTRAVENOUS | Status: AC
  Administered 2024-08-11: 500 mL via INTRAVENOUS

## 2024-08-11 MED ORDER — SODIUM CHLORIDE 0.9 % IV SOLN
2.0000 g | Freq: Once | INTRAVENOUS | Status: AC
  Administered 2024-08-11: 2 g via INTRAVENOUS
  Filled 2024-08-11: qty 20

## 2024-08-11 MED ORDER — ORAL CARE MOUTH RINSE: 15.0000 mL | OROMUCOSAL | Status: DC | PRN

## 2024-08-11 MED ORDER — NOREPINEPHRINE 4 MG/250ML-% IV SOLN
0.0000 ug/min | INTRAVENOUS | Status: DC
  Administered 2024-08-11: 2 ug/min via INTRAVENOUS
  Administered 2024-08-12: 8 ug/min via INTRAVENOUS
  Filled 2024-08-11 (×2): qty 250

## 2024-08-11 NOTE — ED Provider Notes (Signed)
 [facility] Provider Note    Event Date/Time   First MD Initiated Contact with Patient 08/11/24 1119     (approximate)   History   Abnormal Lab and Hypotension   HPI  Eric A Benji Poynter. is a 68 y.o. male who presents to the ED for evaluation of Abnormal Lab and Hypotension   I reviewed an ED visit from 2 months ago.  Seen for dysphagia and GERD.  Patient presents due to progressive generalized weakness over the past week and abnormal labs as an outpatient with patient psychiatrist earlier this week.  Patient and his wife report a chronic degree of reflux and frequent emesis alongside dysphagia.  He has been more weak this past 1 week.  History is limited by patient's mental status and wife with intellectual disability  Additional history over the phone with patient's psychiatrist who is familiar with this patient and has been urging him to come to the hospital all week   Physical Exam   Triage Vital Signs: ED Triage Vitals  Encounter Vitals Group     BP 08/11/24 1122 (!) 60/48     Girls Systolic BP Percentile --      Girls Diastolic BP Percentile --      Boys Systolic BP Percentile --      Boys Diastolic BP Percentile --      Pulse Rate 08/11/24 1122 (!) 136     Resp 08/11/24 1122 (!) 28     Temp 08/11/24 1122 97.7 F (36.5 C)     Temp Source 08/11/24 1122 Oral     SpO2 08/11/24 1117 (!) 88 %     Weight --      Height --      Head Circumference --      Peak Flow --      Pain Score --      Pain Loc --      Pain Education --      Exclude from Growth Chart --     Most recent vital signs: Vitals:   08/11/24 1405 08/11/24 1410  BP: (!) 79/53 (!) 79/54  Pulse: 75 65  Resp: (!) 26 19  Temp:    SpO2: 94% 92%    General: Awake, no distress.  Unwell appearing without distress CV:  Good peripheral perfusion.  Tachycardic Resp:  Tachypnea without distress, right basilar crackles and decreased breath sounds Abd:  No distention.  Soft  and nontender MSK:  No deformity noted.  Neuro:  No focal deficits appreciated. Other:     ED Results / Procedures / Treatments   Labs (all labs ordered are listed, but only abnormal results are displayed) Labs Reviewed  COMPREHENSIVE METABOLIC PANEL WITH GFR - Abnormal; Notable for the following components:      Result Value   Sodium 131 (*)    Potassium 2.9 (*)    Chloride 87 (*)    Glucose, Bld 107 (*)    BUN 59 (*)    Creatinine, Ser 1.66 (*)    Total Protein 6.4 (*)    Albumin  2.7 (*)    GFR, Estimated 45 (*)    Anion gap 16 (*)    All other components within normal limits  CBC WITH DIFFERENTIAL/PLATELET - Abnormal; Notable for the following components:   WBC 16.8 (*)    RBC 3.03 (*)    Hemoglobin 10.1 (*)    HCT 28.0 (*)    MCHC 36.1 (*)    RDW 15.8 (*)  Platelets 114 (*)    Neutro Abs 14.6 (*)    Lymphs Abs 0.6 (*)    Abs Immature Granulocytes 0.47 (*)    All other components within normal limits  LACTIC ACID, PLASMA - Abnormal; Notable for the following components:   Lactic Acid, Venous 2.5 (*)    All other components within normal limits  TROPONIN T, HIGH SENSITIVITY - Abnormal; Notable for the following components:   Troponin T High Sensitivity 44 (*)    All other components within normal limits  RESP PANEL BY RT-PCR (RSV, FLU A&B, COVID)  RVPGX2  CULTURE, BLOOD (ROUTINE X 2)  CULTURE, BLOOD (ROUTINE X 2)  URINE CULTURE  MRSA NEXT GEN BY PCR, NASAL  MRSA NEXT GEN BY PCR, NASAL  EXPECTORATED SPUTUM ASSESSMENT W GRAM STAIN, RFLX TO RESP C  VALPROIC ACID  LEVEL  MAGNESIUM   PHOSPHORUS  LIPASE, BLOOD  PROCALCITONIN  URINALYSIS, ROUTINE W REFLEX MICROSCOPIC  HIV ANTIBODY (ROUTINE TESTING W REFLEX)  STREP PNEUMONIAE URINARY ANTIGEN  LEGIONELLA PNEUMOPHILA SEROGP 1 UR AG  TROPONIN T, HIGH SENSITIVITY    EKG Narrow complex rhythm, appears to be rapid A-fib with a rate of 139 bpm, no STEMI, nonspecific ST changes are present  RADIOLOGY 1 view CXR  interpreted by me with large right basilar infiltrate  Official radiology report(s): DG Chest Portable 1 View Result Date: 08/11/2024 EXAM: 1 VIEW(S) XRAY OF THE CHEST 08/11/2024 11:42:00 AM COMPARISON: 04/03/2022 CLINICAL HISTORY: 68 year old male. Tachypneic and hypotensive. Poor historian. FINDINGS: LUNGS AND PLEURA: Dense airspace opacity in right mid and lower lung zone. Probable superimposed Right pleural effusion. No pneumothorax. HEART AND MEDIASTINUM: No acute abnormality of the cardiac and mediastinal silhouettes. BONES AND SOFT TISSUES: No acute osseous abnormality. IMPRESSION: 1. Extensive patchy and confluent nonspecific opacification of the right lower lobe. Main differential considerations are pneumonia and/or tumor, with probable associated pleural effusion. Electronically signed by: Helayne Hurst MD 08/11/2024 12:11 PM EST RP Workstation: [id number]    PROCEDURES and INTERVENTIONS:  .Critical Care  Performed by: Claudene Rover, MD Authorized by: Claudene Rover, MD   Critical care provider statement:    Critical care time (minutes):  75   Critical care time was exclusive of:  Separately billable procedures and treating other patients   Critical care was necessary to treat or prevent imminent or life-threatening deterioration of the following conditions:  Sepsis, circulatory failure and respiratory failure   Critical care was time spent personally by me on the following activities:  Development of treatment plan with patient or surrogate, discussions with consultants, evaluation of patient's response to treatment, examination of patient, ordering and review of laboratory studies, ordering and review of radiographic studies, ordering and performing treatments and interventions, pulse oximetry, re-evaluation of patient's condition and review of old charts .1-3 Lead EKG Interpretation  Performed by: Claudene Rover, MD Authorized by: Claudene Rover, MD     Interpretation: normal     ECG  rate:  70   ECG rate assessment: normal     Rhythm: sinus rhythm     Ectopy: none     Conduction: normal     Medications  norepinephrine  (LEVOPHED ) 4mg  in (0.016 mg/mL) premix infusion (2 mcg/min Intravenous New Bag/Given 08/11/24 1416)  Chlorhexidine  Gluconate Cloth 2 % PADS 6 each (has no administration in time range)  docusate sodium  (COLACE) capsule 100 mg (has no administration in time range)  polyethylene glycol (MIRALAX  / GLYCOLAX ) packet 17 g (has no administration in time range)  hydrocortisone  sodium succinate  (SOLU-CORTEF )  100 MG injection 100 mg (has no administration in time range)  risperiDONE  (RISPERDAL ) tablet 1 mg (has no administration in time range)  pantoprazole  (PROTONIX ) EC tablet 40 mg (has no administration in time range)  cefTRIAXone  (ROCEPHIN ) 2 g in sodium chloride  0.9 % 100 mL IVPB (has no administration in time range)  azithromycin  (ZITHROMAX ) 500 mg in sodium chloride  0.9 % 250 mL IVPB (has no administration in time range)  lactated ringers  bolus 1,000 mL (0 mLs Intravenous Stopped 08/11/24 1210)  cefTRIAXone  (ROCEPHIN ) 2 g in sodium chloride  0.9 % 100 mL IVPB (0 g Intravenous Stopped 08/11/24 1229)  azithromycin  (ZITHROMAX ) 500 mg in sodium chloride  0.9 % 250 mL IVPB (0 mg Intravenous Stopped 08/11/24 1330)  lactated ringers  bolus 1,000 mL (0 mLs Intravenous Stopped 08/11/24 1239)  lactated ringers  bolus 1,000 mL (0 mLs Intravenous Stopped 08/11/24 1334)     IMPRESSION / MDM / ASSESSMENT AND PLAN / ED COURSE  I reviewed the triage vital signs and the nursing notes.  Differential diagnosis includes, but is not limited to, ACS, PTX, PNA, muscle strain/spasm, PE, dissection, anxiety, pleural effusion  {Patient presents with symptoms of an acute illness or injury that is potentially life-threatening.  Patient presents with generalized weakness and signs of septic shock from commune acquired pneumonia, likely aspiration pneumonia, requiring ICU  admission.  Presents quite tachycardic that seems to be atrial fibrillation that self resolves and he normalizes to in NSR.  Tachypneic and hypoxic requiring nasal cannula.  Soft blood pressures that improved with IV fluids but cannot maintain maps over 65 and so initiate peripheral vasopressors.  Lactic acidosis, elevated procalcitonin, leukocytosis.  Mild elevation of troponin likely secondary to his respiratory status and hemodynamics.  He has no chest pain.  Patient consult with ICU who admits.  2 mics per minute on Levophed  through 18-gauge in the left [facility]  Clinical Course as of 08/11/24 1447  Fri Aug 11, 2024  1135 I discuss with Dr. Chipper, familiar with the patient and treating him for 3yrs. She secure chat messages me, requesting phone call. Has been trying to get him to the hospital all week because of weakness, dehydration, weight loss. Can depakote  off list all together, can continue risperdal  [DS]  1303 reassessed [DS]  1348 Reassessed.  Despite 3 L of IV fluids he remains with soft pressures.  His respiratory status seems to be worsening somewhat where he is lowering his pulse oximetry despite continued supplemental O2.  I am concerned additional IV fluids may be deleterious to his respiratory status and we will transition to peripheral vasopressors and I will consult with ICU with concerns for septic shock [DS]  1420 I consult with ICU who request CT scan without contrast to evaluate for empyema, they will come see for admission now [DS]    Clinical Course User Index [DS] Claudene Rover, MD     FINAL CLINICAL IMPRESSION(S) / ED DIAGNOSES   Final diagnoses:  Septic shock (HCC)  Community acquired pneumonia of right lower lobe of lung     Rx / DC Orders   ED Discharge Orders     None        Note:  This document was prepared using Dragon voice recognition software and may include unintentional dictation errors.   Claudene Rover, MD 08/11/24 [phone]

## 2024-08-11 NOTE — H&P (Signed)
 NAME:  Eric Bradly., MRN:  [id number], DOB:  09/05/1956, LOS: 0 ADMISSION DATE:  08/11/2024  History of Present Illness:  Case of 68 year old male patient with a past medical history of bipolar disorder, smoking history and recent history of dysphagia and GERD and choking on food presenting to [facility] on 11/21 due to generalized weakness hypotension and shortness of breath.  He was found to be in shock likely secondary to community-acquired pneumonia requiring ICU admission for norepinephrine .  Per wife who is at bedside who also has intellectual disability, Eric Hatfield has been having weakness and lethargy for the past week.  She emphasizes that he has been choking on food and having GERD for the past month.  She does report some weight loss but could not tell me how much.  Per chart review he had a fall about 4 days ago and cannot get around the house and was recommended to go to the emergency department.  In the ED, initially on room air however post fluid resuscitation for hypotension he was placed on 3 L nasal cannula.  Labs with elevated white count at 16.8 with left shift 87% neutrophils.  Creatinine of 1.66 mg/dL from a baseline of 0.9.  Hypokalemia with potassium of 2.9.  Lactic acidosis with a lactate of 2.5.  Chest x-Navin with extensive patchy opacification of the right lower lobe.  Bedside ultrasound of the right hemithorax without any signs of empyema but did show extensive consolidation.  CT chest without contrast with extensive right lower lobe and right middle lobe consolidations.  Small pleural effusion.  Thickened esophagus.  And masslike consolidation in the right middle lobe.  Of note he is a smoker and was enrolled in the lung cancer screening program and had a CT chest in 2023 with a 7.8 mm right middle lobe nodule.  Pertinent  Medical History  As above  Significant Hospital Events: Including procedures, antibiotic start and stop dates in addition to other pertinent  events   08/11/2024 -admitted to the ICU for Nor epi support.   Objective    Blood pressure (!) 79/54, pulse 65, temperature 98.3 F (36.8 C), resp. rate 19, height 5' 8 (1.727 m), weight 62.7 kg, SpO2 92%.        Intake/Output Summary (Last 24 hours) at 08/11/2024 1445 Last data filed at 08/11/2024 1334 Gross per 24 hour  Intake 3350 ml  Output --  Net 3350 ml   Filed Weights   08/11/24 1128  Weight: 62.7 kg    Examination: General: Chronically ill not in respiratory distress HENT: Supple neck, reactive pupils Lungs: Rhonchorous and diminished air entry over the right hemithorax Cardiovascular: Normal S1, normal S2, regular rate and rhythm Abdomen: Soft nontender nondistended positive bowel sounds Extremities: Warm and well-perfused no edema  Labs and imaging were reviewed and.  Assessment and Plan  Case of 68 year old male patient with a past medical history of bipolar disorder, smoking history and recent history of dysphagia and GERD and choking on food presenting to [facility] on 11/21 due to generalized weakness hypotension and shortness of breath.  He was found to be in shock likely secondary to community-acquired pneumonia requiring ICU admission for norepinephrine .  # Shock likely distributive in the setting of # Community-acquired pneumonia versus postobstructive pneumonia\ # Dysphagia, suspicion for either lung cancer or esophageal cancer.  History of smoking CT chest lung cancer screening in 2023 with a 7.8 mm right middle lobe nodule that was not followed up. # AKI likely  prerenal azotemia with a creatinine of 1.6 mg/dL from a baseline of 0.9 mg/dL. # Bipolar disorder  Neuro: Continue with Risperidone  1mg  at bedtime.  Reportedly his psychiatrist called and advised to DC his Depakote .  Continue with home escitalopram  5 mg p.o. daily. CVS: Nor epi for MAP greater than 65.  Received already 2 L but his respiratory status worsened.  Therefore started on  norepinephrine . ID: [email or web address]  to cover for postobstructive pneumonia.  Azithromycin  for atypicals.  Urine strep and Legionella MRSA swab and sputum culture and Gram stain. Renal: Received 2 L in the ED.  Trend creatinine daily.  Replete potassium. Heme: Heparin  5000 subcu for DVT prophylaxis. Endo: POC 140-180 GI: Pantoprazole  40 mg p.o. daily as a continuation from home  Critical care time: 70 minutes    Darrin Barn, MD [facility] 08/11/2024 3:05 PM

## 2024-08-11 NOTE — Sepsis Progress Note (Signed)
 Code Sepsis protocol being monitored by eLink. Has had blood cultures, antibiotics and 3L LR. Initial Lactic acid 2.5. Will secure chat MD for repeat lactic acid order

## 2024-08-11 NOTE — Progress Notes (Addendum)
 Almarie, NP notified that during bedside report, patient's HR went bradycardic down into 30s. Sustaining in 50s-60s. No new orders.

## 2024-08-11 NOTE — Progress Notes (Signed)
 PHARMACY CONSULT NOTE - FOLLOW UP  Pharmacy Consult for Electrolyte Monitoring and Replacement   Recent Labs: Potassium (mmol/L)  Date Value  08/11/2024 2.9 (L)   Magnesium  (mg/dL)  Date Value  88/78/7974 2.3   Calcium  (mg/dL)  Date Value  88/78/7974 8.9   Albumin  (g/dL)  Date Value  88/78/7974 2.7 (L)  11/30/2023 3.8 (L)   Phosphorus (mg/dL)  Date Value  88/78/7974 3.6   Sodium (mmol/L)  Date Value  08/11/2024 131 (L)  11/30/2023 132 (L)     Assessment: 68 y.o. male w/ PMH of bipolar d/o, HTN, GERD, dysphagia, h/o intestinal disaccharide deficiency  who presents to the ED for evaluation of an abnormal lab and hypotension. Pharmacy is asked to follow and replace electrolytes while in CCU   Goal of Therapy:  Electrolytes WNL  Plan:  ---10 mEq IV KCl x 2 per MD: will add an additional 2 runs of 10 mEq ---recheck electrolytes in am  Adriana JONETTA Bolster ,PharmD Clinical Pharmacist 08/11/2024 2:56 PM

## 2024-08-11 NOTE — Progress Notes (Signed)
 Dr. Malka notified pt converted back into Afib up to 130s. See new orders for 500 cc LR bolus.

## 2024-08-11 NOTE — ED Triage Notes (Signed)
 Pt BIB BLS EMS for hypotension (67/42 on arrival) and abnormal lab. Per pt wife and doctor his Valproic acid  level was toxic. Wife reports 20 lbs weight loss and recent change in medications.

## 2024-08-12 ENCOUNTER — Inpatient Hospital Stay

## 2024-08-12 ENCOUNTER — Inpatient Hospital Stay (HOSPITAL_COMMUNITY)
Admit: 2024-08-12 | Discharge: 2024-08-12 | Disposition: A | Discharge status: Home / Self Care | Attending: Pulmonary Disease | Admitting: Pulmonary Disease

## 2024-08-12 DIAGNOSIS — R7989 Other specified abnormal findings of blood chemistry: Secondary | ICD-10-CM

## 2024-08-12 DIAGNOSIS — J810 Acute pulmonary edema: Secondary | ICD-10-CM

## 2024-08-12 DIAGNOSIS — J90 Pleural effusion, not elsewhere classified: Secondary | ICD-10-CM

## 2024-08-12 DIAGNOSIS — E876 Hypokalemia: Secondary | ICD-10-CM

## 2024-08-12 DIAGNOSIS — E8721 Acute metabolic acidosis: Secondary | ICD-10-CM

## 2024-08-12 DIAGNOSIS — J189 Pneumonia, unspecified organism: Secondary | ICD-10-CM | POA: Diagnosis not present

## 2024-08-12 DIAGNOSIS — R578 Other shock: Secondary | ICD-10-CM

## 2024-08-12 DIAGNOSIS — G929 Unspecified toxic encephalopathy: Secondary | ICD-10-CM

## 2024-08-12 DIAGNOSIS — I4891 Unspecified atrial fibrillation: Secondary | ICD-10-CM

## 2024-08-12 DIAGNOSIS — R6521 Severe sepsis with septic shock: Secondary | ICD-10-CM | POA: Diagnosis not present

## 2024-08-12 DIAGNOSIS — A419 Sepsis, unspecified organism: Secondary | ICD-10-CM | POA: Diagnosis not present

## 2024-08-12 LAB — BLOOD GAS, ARTERIAL
Acid-Base Excess: 5 mmol/L — ABNORMAL HIGH (ref 0.0–2.0)
Bicarbonate: 29.1 mmol/L — ABNORMAL HIGH (ref 20.0–28.0)
FIO2: 0.6 %
O2 Saturation: 99.6 %
Patient temperature: 37
pCO2 arterial: 40 mmHg (ref 32–48)
pH, Arterial: 7.47 — ABNORMAL HIGH (ref 7.35–7.45)
pO2, Arterial: 97 mmHg (ref 83–108)

## 2024-08-12 LAB — URINALYSIS, ROUTINE W REFLEX MICROSCOPIC
Bacteria, UA: NONE SEEN
Bilirubin Urine: NEGATIVE
Glucose, UA: 150 mg/dL — AB
Ketones, ur: NEGATIVE mg/dL
Leukocytes,Ua: NEGATIVE
Nitrite: NEGATIVE
Protein, ur: 100 mg/dL — AB
Specific Gravity, Urine: 1.017 (ref 1.005–1.030)
Squamous Epithelial / HPF: 0 /HPF (ref 0–5)
pH: 5 (ref 5.0–8.0)

## 2024-08-12 LAB — MAGNESIUM: Magnesium: 2.2 mg/dL (ref 1.7–2.4)

## 2024-08-12 LAB — STREP PNEUMONIAE URINARY ANTIGEN: Strep Pneumo Urinary Antigen: NEGATIVE

## 2024-08-12 LAB — RENAL FUNCTION PANEL
Albumin: 2.1 g/dL — ABNORMAL LOW (ref 3.5–5.0)
Anion gap: 13 (ref 5–15)
BUN: 49 mg/dL — ABNORMAL HIGH (ref 8–23)
CO2: 27 mmol/L (ref 22–32)
Calcium: 8.3 mg/dL — ABNORMAL LOW (ref 8.9–10.3)
Chloride: 90 mmol/L — ABNORMAL LOW (ref 98–111)
Creatinine, Ser: 1.27 mg/dL — ABNORMAL HIGH (ref 0.61–1.24)
GFR, Estimated: >60 mL/min (ref >60)
Glucose, Bld: 155 mg/dL — ABNORMAL HIGH (ref 70–99)
Phosphorus: 3.8 mg/dL (ref 2.5–4.6)
Potassium: 3 mmol/L — ABNORMAL LOW (ref 3.5–5.1)
Sodium: 130 mmol/L — ABNORMAL LOW (ref 135–145)

## 2024-08-12 LAB — ECHOCARDIOGRAM COMPLETE
AR max vel: 1.12 cm2
AV Area VTI: 1.32 cm2
AV Area mean vel: 1.12 cm2
AV Mean grad: 14 mmHg
AV Peak grad: 25.8 mmHg
Ao pk vel: 2.54 m/s
Area-P 1/2: 1.64 cm2
Height: 68 in
MV VTI: 1.08 cm2
S' Lateral: 2.9 cm
Weight: 2328.06 [oz_av]

## 2024-08-12 LAB — CBC
HCT: 25 % — ABNORMAL LOW (ref 39.0–52.0)
Hemoglobin: 8.9 g/dL — ABNORMAL LOW (ref 13.0–17.0)
MCH: 32.7 pg (ref 26.0–34.0)
MCHC: 35.6 g/dL (ref 30.0–36.0)
MCV: 91.9 fL (ref 80.0–100.0)
Platelets: 86 K/uL — ABNORMAL LOW (ref 150–400)
RBC: 2.72 MIL/uL — ABNORMAL LOW (ref 4.22–5.81)
RDW: 15.9 % — ABNORMAL HIGH (ref 11.5–15.5)
WBC: 17.8 K/uL — ABNORMAL HIGH (ref 4.0–10.5)
nRBC: 0 % (ref 0.0–0.2)

## 2024-08-12 LAB — LACTIC ACID, PLASMA: Lactic Acid, Venous: 0.9 mmol/L (ref 0.5–1.9)

## 2024-08-12 LAB — POTASSIUM
Potassium: 3.3 mmol/L — ABNORMAL LOW (ref 3.5–5.1)
Potassium: 3.4 mmol/L — ABNORMAL LOW (ref 3.5–5.1)

## 2024-08-12 LAB — HIV ANTIBODY (ROUTINE TESTING W REFLEX): HIV Screen 4th Generation wRfx: NONREACTIVE

## 2024-08-12 MED ORDER — PIPERACILLIN-TAZOBACTAM 3.375 G IVPB
3.3750 g | Freq: Three times a day (TID) | INTRAVENOUS | Status: DC
  Administered 2024-08-12 – 2024-08-16 (×12): 3.375 g via INTRAVENOUS
  Filled 2024-08-12 (×12): qty 50

## 2024-08-12 MED ORDER — POTASSIUM CHLORIDE 10 MEQ/100ML IV SOLN
10.0000 meq | INTRAVENOUS | Status: AC
  Administered 2024-08-12 (×4): 10 meq via INTRAVENOUS
  Filled 2024-08-12 (×4): qty 100

## 2024-08-12 MED ORDER — IOHEXOL 350 MG/ML SOLN
75.0000 mL | Freq: Once | INTRAVENOUS | Status: AC | PRN
  Administered 2024-08-12: 75 mL via INTRAVENOUS

## 2024-08-12 MED ORDER — ALBUMIN HUMAN 25 % IV SOLN
12.5000 g | Freq: Four times a day (QID) | INTRAVENOUS | Status: AC
  Administered 2024-08-12 (×2): 12.5 g via INTRAVENOUS
  Filled 2024-08-12 (×2): qty 50

## 2024-08-12 MED ORDER — NOREPINEPHRINE 4 MG/250ML-% IV SOLN
0.0000 ug/min | INTRAVENOUS | Status: DC
  Administered 2024-08-12: 10 ug/min via INTRAVENOUS
  Administered 2024-08-12: 5 ug/min via INTRAVENOUS
  Administered 2024-08-13: 4 ug/min via INTRAVENOUS
  Filled 2024-08-12 (×3): qty 250

## 2024-08-12 MED ORDER — PANTOPRAZOLE SODIUM 40 MG IV SOLR
40.0000 mg | INTRAVENOUS | Status: DC
  Administered 2024-08-12 – 2024-08-16 (×5): 40 mg via INTRAVENOUS
  Filled 2024-08-12 (×5): qty 10

## 2024-08-12 MED ORDER — SODIUM CHLORIDE 0.9 % IV SOLN
250.0000 mL | INTRAVENOUS | Status: AC
  Administered 2024-08-12: 250 mL via INTRAVENOUS

## 2024-08-12 MED ORDER — POTASSIUM CHLORIDE CRYS ER 20 MEQ PO TBCR: 20.0000 meq | EXTENDED_RELEASE_TABLET | Freq: Once | ORAL | Status: DC

## 2024-08-12 NOTE — Plan of Care (Signed)
  Problem: Clinical Measurements: Goal: Ability to maintain clinical measurements within normal limits will improve Outcome: Not Progressing Goal: Will remain free from infection Outcome: Not Progressing Goal: Diagnostic test results will improve Outcome: Not Progressing Goal: Respiratory complications will improve Outcome: Not Progressing Goal: Cardiovascular complication will be avoided Outcome: Not Progressing   Problem: Activity: Goal: Risk for activity intolerance will decrease Outcome: Not Progressing   Problem: Nutrition: Goal: Adequate nutrition will be maintained Outcome: Not Progressing   Problem: Coping: Goal: Level of anxiety will decrease Outcome: Not Progressing

## 2024-08-12 NOTE — Progress Notes (Signed)
 PHARMACY CONSULT NOTE  Pharmacy Consult for Electrolyte Monitoring and Replacement   Recent Labs: Potassium (mmol/L)  Date Value  08/12/2024 3.0 (L)   Magnesium  (mg/dL)  Date Value  88/77/7974 2.2   Calcium  (mg/dL)  Date Value  88/77/7974 8.3 (L)   Albumin  (g/dL)  Date Value  88/77/7974 2.1 (L)  11/30/2023 3.8 (L)   Phosphorus (mg/dL)  Date Value  88/77/7974 3.8   Sodium (mmol/L)  Date Value  08/12/2024 130 (L)  11/30/2023 132 (L)   Assessment: 68 y.o. male w/ PMH of bipolar d/o, HTN, GERD, dysphagia, h/o intestinal disaccharide deficiency  who presents to the ED for evaluation of an abnormal lab and hypotension. Pharmacy is asked to follow and replace electrolytes while in CCU   Goal of Therapy:  Electrolytes WNL  Plan:  --Na 130, Cl 90. Continue to monitor and will hopefully improved with potassium chloride  replacement --K 3, Kcl 10 mEq IV q1h x 4 ordered per PCCM team --Re-check electrolytes tomorrow AM  Marolyn KATHEE Mare 08/12/2024 6:45 AM

## 2024-08-12 NOTE — Progress Notes (Signed)
 A&O oriented patient admitted to [facility] with sepsis secondary to CAP. Patient denies shortness of breath or pain.  2041 - BP  81/59 (68) Levo increased to 7 mcg/min  2145 - Potassium replacements completed  2345 - BP 82/60 (67) Levo  increased to 8 mcg/min  2300-Elizabeth NP notified that patient is now in A-fib as high as 130 but not sustained.  2358-Patients O2 dropping 83 % on 6 L bubble high flow, increased to 7 L with little improvement, now at 87%  0002- NRB placed on patient and sats improved >92%  0003- RT notified patients placed on NRB; if no improvement, RT will place on HHFNC.   0050 - Patient not voided since arrival to ICU; bladder scan completed, shows 758 in bladder 600 ml amber urine obtained, all samples sent to lab per order.   0148 - Patient SB, HR 50's  0400 - Elizabeth, NP at bedside. BP 92/60 (70) Levo decreased to 7 mcg/min  0508 GLENWOOD Norris, NP notified of potassium 3.0  0645 - Patient not able to void; Bladder scan shows 488. In/out cath drained 400 ml of amber urine

## 2024-08-12 NOTE — Progress Notes (Signed)
  Echocardiogram 2D Echocardiogram has been performed.  Eric Hatfield 08/12/2024, 2:42 PM

## 2024-08-12 NOTE — Plan of Care (Signed)
  Problem: Education: Goal: Knowledge of General Education information will improve Description: Including pain rating scale, medication(s)/side effects and non-pharmacologic comfort measures Outcome: Progressing   Problem: Clinical Measurements: Goal: Ability to maintain clinical measurements within normal limits will improve Outcome: Progressing Goal: Cardiovascular complication will be avoided Outcome: Progressing   Problem: Activity: Goal: Risk for activity intolerance will decrease Outcome: Progressing   Problem: Nutrition: Goal: Adequate nutrition will be maintained Outcome: Progressing   Problem: Coping: Goal: Level of anxiety will decrease Outcome: Progressing   Problem: Pain Managment: Goal: General experience of comfort will improve and/or be controlled Outcome: Progressing   Problem: Safety: Goal: Ability to remain free from injury will improve Outcome: Progressing   Problem: Clinical Measurements: Goal: Will remain free from infection Outcome: Not Progressing Goal: Diagnostic test results will improve Outcome: Not Progressing Goal: Respiratory complications will improve Outcome: Not Progressing   Problem: Elimination: Goal: Will not experience complications related to bowel motility Outcome: Not Progressing Goal: Will not experience complications related to urinary retention Outcome: Not Progressing

## 2024-08-12 NOTE — Progress Notes (Addendum)
 NAME:  Eric Feldpausch., MRN:  [id number], DOB:  [birth year], LOS: 1 ADMISSION DATE:  08/11/2024, CONSULTATION DATE:  08/11/24 REFERRING MD:  Ester Sharps, MD CHIEF COMPLAINT:  Hypotension; abnormal labs   History of Present Illness:  Case of 68 year old male patient with a past medical history of bipolar disorder, smoking history and recent history of dysphagia and GERD and choking on food presenting to [facility] on 11/21 due to generalized weakness hypotension and shortness of breath.  He was found to be in shock likely secondary to community-acquired pneumonia requiring ICU admission for norepinephrine .   Per wife who is at bedside who also has intellectual disability, Eric Hatfield has been having weakness and lethargy for the past week.  She emphasizes that he has been choking on food and having GERD for the past month.  She does report some weight loss but could not tell me how much.   Per chart review he had a fall about 4 days ago and cannot get around the house and was recommended to go to the emergency department.   In the ED, initially on room air however post fluid resuscitation for hypotension he was placed on 3 L nasal cannula.   Labs with elevated white count at 16.8 with left shift 87% neutrophils.  Creatinine of 1.66 mg/dL from a baseline of 0.9.  Hypokalemia with potassium of 2.9.  Lactic acidosis with a lactate of 2.5.   Chest x-Eryn with extensive patchy opacification of the right lower lobe.   Bedside ultrasound of the right hemithorax without any signs of empyema but did show extensive consolidation.   CT chest without contrast with extensive right lower lobe and right middle lobe consolidations.  Small pleural effusion.  Thickened esophagus.  And masslike consolidation in the right middle lobe.   Of note he is a smoker and was enrolled in the lung cancer screening program and had a CT chest in 2023 with a 7.8 mm right middle lobe nodule.  Pertinent Medical History   Hypertension Bipolar Disorder  Significant Hospital Events: Including procedures, antibiotic start and stop dates in addition to other pertinent events   11/21: Admitted to the ICU for levophed  support 11/22: Remains critically ill requiring HHFNC and now bipap for hypoxia. Increasing pressor requirements.   Interim History / Subjective:  See above listed under Significant Hospital Events.  Objective    Blood pressure (!) 94/56, pulse 96, temperature (!) 96.8 F (36 C), temperature source Axillary, resp. rate (!) 30, height 5' 8 (1.727 m), weight 66 kg, SpO2 90%.    FiO2 (%):  [6 %-96 %] 96 %   Intake/Output Summary (Last 24 hours) at 08/12/2024 0840 Last data filed at 08/12/2024 0700 Gross per 24 hour  Intake 5157.24 ml  Output 1000 ml  Net 4157.24 ml   Filed Weights   08/11/24 1128 08/11/24 1548 08/12/24 0500  Weight: 62.7 kg 61.2 kg 66 kg    Examination: General: critically ill male, lying in bed in NAD HENT: atraumatic, normocephalic, supple, no jvd Lungs: diminished on right with mild coarse breath sounds, clear to auscultation on left Cardiovascular: RRR, S1 S2, no m/r/g Abdomen: soft, non-tender, non-distended Extremities: warm, dry, intact, radial pulses 2+, distal pulses 1+, no edema Neuro: alert and oriented x 4 but intermittently falls asleep, follows commands, no focal neuro deficits, PERRL GU: deferred, foley in place draining yellow urine  Resolved problem list   Assessment and Plan   #Toxic Metabolic Encephalopathy #Bipolar Disorder - Avoid sedating  medications as able - Continue home meds: Risperidone  1mg  daily and Lexapro  5mg  daily - Encourage appropriate sleep/wake cycle - Family support at bedside when able  #Septic Shock secondary to CAP vs. Post-Obstructive Pneumonia #AGMA iso Septic Shock with Lactic Acidosis ~ Resolved #Elevated Troponin suspected d/t Demand Ischemia #Atrial Fibrillation - Continuous cardiac monitoring - Vasopressors  to maintain MAP goal >65 - Increasing pressor requirements; currently at 10mcg (was 6cmg this am) - Continue stress dose steroids - EKG as indicated - ECHO pending - Trend lactic; now normalized - Troponin peaked at 44  #Acute Hypoxic Respiratory Failure s/t CAP #Bilateral laying Pleural Effusions #Pulmonary Edema #Suspected Pulmonary Embolism ~ Ruled Out via CTA 11/22 #Suspected Right Empyema 11/22 Chest CTA PE: Confulent areas of heterogeneous hypoenhancement in the right lung are more consistent with multifocal tumor than consolidation, up to 9.8 cm each. Underlying Emphysema. Bilateral layering pleural effusions have mildly progressed since yesterday, with increased dependent and lower lobe atelectasis. No evidence of pulmonary embolism. Indeterminant T6 and T10 compression fractures are stable. - Supplemental O2 to maintain sat >92% - Currently on HHFNC; will switch to Bipap  - Wean FiO2 and PEEP as able - Low threshold for intubation - Intermittent CXR and ABG - CTA Chest PE: negative for PE - Repeat Chest CT tomorrow am, if worsening will place chest tube - Consider biopsy next week for lung nodules  #AKI s/t Distributive Shock #Hypokalemia #Hyponatremia - Strict I/O - Trend Bmp and monitor renal function - Avoid nephrotoxins as able - Ensure renal perfusion - ICU electrolyte replacement protocol - Pharmacy to assist with replacement - Will receive K replacement - In and out cath x 2, unable to void; will place foley  #Esophageal Dysmotility #Dysphagia suspected d/t Achalasia #GERD #Hypoalbuminemia Hx: GERD, dysphagia, intestinal disaccharide deficiency - PPI: protonix  - Diet: NPO - Constipation protocol prn - Will give Albumin  12.5g BID x 2 doses  #Acute Anemia #Thrombocytopenia #DVT Prophylaxis - Trend CBC and monitor H/H and platelets - Hgb 8.9 and platelets 86 - Transfuse if Hgb < 7 - Monitor for s/sx of bleeding - DVT ppx: SQ  heparin   #Hyperglycemia - ICU hypo/hyperglycemia protocol - CBG Q4h - SSI - Range 140-180  #Community Acquired Pneumonia vs Post-Obstructive Pneumonia - Trend WBC and monitor fever curve - Continue Azithromycin  and Zosyn  - Narrow ABX pending culture and sensitivities - Strep pneumo urinary antigen negative - Legionella Urinary Antigen pending - Procalcitonin 1.1 - Respiratory PCR (flu/covid/rsv): negative  Labs   CBC: Recent Labs  Lab 08/11/24 1129 08/12/24 0341  WBC 16.8* 17.8*  NEUTROABS 14.6*  --   HGB 10.1* 8.9*  HCT 28.0* 25.0*  MCV 92.4 91.9  PLT 114* 86*    Basic Metabolic Panel: Recent Labs  Lab 08/11/24 1129 08/12/24 0341  NA 131* 130*  K 2.9* 3.0*  CL 87* 90*  CO2 27 27  GLUCOSE 107* 155*  BUN 59* 49*  CREATININE 1.66* 1.27*  CALCIUM  8.9 8.3*  MG 2.3 2.2  PHOS 3.6 3.8   GFR: Estimated Creatinine Clearance: 52.7 mL/min (A) (by C-G formula based on SCr of 1.27 mg/dL (H)). Recent Labs  Lab 08/11/24 1129 08/11/24 1205 08/11/24 1330 08/11/24 1653 08/12/24 0341  PROCALCITON  --   --  1.10  --   --   WBC 16.8*  --   --   --  17.8*  LATICACIDVEN  --  2.5*  --  1.5  --     Liver Function Tests: Recent Labs  Lab 08/11/24 1129 08/12/24 0341  AST 24  --   ALT 7  --   ALKPHOS 73  --   BILITOT 0.9  --   PROT 6.4*  --   ALBUMIN  2.7* 2.1*   Recent Labs  Lab 08/11/24 1129  LIPASE 16   No results for input(s): AMMONIA in the last 168 hours.  ABG No results found for: PHART, PCO2ART, PO2ART, HCO3, TCO2, ACIDBASEDEF, O2SAT   Coagulation Profile: No results for input(s): INR, PROTIME in the last 168 hours.  Cardiac Enzymes: No results for input(s): CKTOTAL, CKMB, CKMBINDEX, TROPONINI in the last 168 hours.  HbA1C: Hgb A1c MFr Bld  Date/Time Value Ref Range Status  04/04/2022 04:40 AM 5.7 (H) 4.8 - 5.6 % Final    Comment:    (NOTE) Pre diabetes:          5.7%-6.4%  Diabetes:              >6.4%  Glycemic  control for   <7.0% adults with diabetes   12/24/2010 12:00 AM 5.8 4.0 - 6.0 % Final    CBG: Recent Labs  Lab 08/11/24 1544  GLUCAP 112*    Review of Systems:   Endorses shortness of breath. All other systems negative.   Past Medical History:  He,  has a past medical history of Bipolar disorder (HCC) and Hypertension.   Surgical History:   Past Surgical History:  Procedure Laterality Date   Impacted tooth       Social History:   reports that he has been smoking cigarettes. He started smoking about 49 years ago. He has a 36.9 pack-year smoking history. He has never used smokeless tobacco. He reports that he does not currently use alcohol. He reports that he does not currently use drugs.   Family History:  His family history includes Hyperlipidemia in his father; Hypertension in his brother and mother; Mental illness in his paternal grandmother.   Allergies No Known Allergies   Home Medications  Prior to Admission medications   Medication Sig Start Date End Date Taking? Authorizing Provider  divalproex  (DEPAKOTE ) 250 MG DR tablet Take 250-500 mg by mouth 2 (two) times daily. Take 250 mg (one tablet) by mouth every morning and 500 mg (two tablets) at bedtime 07/05/24  Yes [provider]  escitalopram  (LEXAPRO ) 5 MG tablet Take 5 mg by mouth daily. 07/03/24  Yes [provider]  irbesartan  (AVAPRO ) 75 MG tablet TAKE 1 TABLET BY MOUTH DAILY. 09/25/23  Yes Gasper Nancyann BRAVO, MD  RABEprazole  (ACIPHEX ) 20 MG tablet Take 1 tablet (20 mg total) by mouth daily. 07/31/24  Yes Gasper Nancyann BRAVO, MD  risperiDONE  (RISPERDAL ) 1 MG tablet Take 1 mg by mouth at bedtime. 07/03/24  Yes [provider]  rosuvastatin  (CRESTOR ) 5 MG tablet TAKE ONE TABLET BY MOUTH ONCE DAILY 08/03/24  Yes Gasper Nancyann BRAVO, MD     Critical care time: 65 minutes     Phoenyx Melka, PA-C Pulmonary/Critical Care PCCM Team Contact Info: [phone]

## 2024-08-13 ENCOUNTER — Inpatient Hospital Stay

## 2024-08-13 DIAGNOSIS — G929 Unspecified toxic encephalopathy: Secondary | ICD-10-CM | POA: Diagnosis not present

## 2024-08-13 DIAGNOSIS — R6521 Severe sepsis with septic shock: Secondary | ICD-10-CM | POA: Diagnosis not present

## 2024-08-13 DIAGNOSIS — J189 Pneumonia, unspecified organism: Secondary | ICD-10-CM | POA: Diagnosis not present

## 2024-08-13 DIAGNOSIS — A419 Sepsis, unspecified organism: Secondary | ICD-10-CM | POA: Diagnosis not present

## 2024-08-13 DIAGNOSIS — J9601 Acute respiratory failure with hypoxia: Secondary | ICD-10-CM

## 2024-08-13 LAB — CBC WITH DIFFERENTIAL/PLATELET
Abs Immature Granulocytes: 0.3 K/uL — ABNORMAL HIGH (ref 0.00–0.07)
Basophils Absolute: 0 K/uL (ref 0.0–0.1)
Basophils Relative: 0 %
Eosinophils Absolute: 0 K/uL (ref 0.0–0.5)
Eosinophils Relative: 0 %
HCT: 23.3 % — ABNORMAL LOW (ref 39.0–52.0)
Hemoglobin: 8.1 g/dL — ABNORMAL LOW (ref 13.0–17.0)
Immature Granulocytes: 2 %
Lymphocytes Relative: 4 %
Lymphs Abs: 0.5 K/uL — ABNORMAL LOW (ref 0.7–4.0)
MCH: 32.7 pg (ref 26.0–34.0)
MCHC: 34.8 g/dL (ref 30.0–36.0)
MCV: 94 fL (ref 80.0–100.0)
Monocytes Absolute: 0.6 K/uL (ref 0.1–1.0)
Monocytes Relative: 4 %
Neutro Abs: 11.6 K/uL — ABNORMAL HIGH (ref 1.7–7.7)
Neutrophils Relative %: 90 %
Platelets: 61 K/uL — ABNORMAL LOW (ref 150–400)
RBC: 2.48 MIL/uL — ABNORMAL LOW (ref 4.22–5.81)
RDW: 16.3 % — ABNORMAL HIGH (ref 11.5–15.5)
WBC: 12.9 K/uL — ABNORMAL HIGH (ref 4.0–10.5)
nRBC: 0 % (ref 0.0–0.2)

## 2024-08-13 LAB — BLOOD GAS, ARTERIAL
Acid-Base Excess: 2.6 mmol/L — ABNORMAL HIGH (ref 0.0–2.0)
Acid-Base Excess: 2.9 mmol/L — ABNORMAL HIGH (ref 0.0–2.0)
Acid-Base Excess: 3.9 mmol/L — ABNORMAL HIGH (ref 0.0–2.0)
Bicarbonate: 27.2 mmol/L (ref 20.0–28.0)
Bicarbonate: 27.1 mmol/L (ref 20.0–28.0)
Bicarbonate: 31.3 mmol/L — ABNORMAL HIGH (ref 20.0–28.0)
Expiratory PAP: 8 cmH2O
FIO2: 95 %
FIO2: 100 %
FIO2: 100 %
Inspiratory PAP: 16 cmH2O
MECHVT: 420 mL
Mechanical Rate: 16
O2 Content: 55 L/min
O2 Saturation: 96 %
O2 Saturation: 87.6 %
O2 Saturation: 96 %
PEEP: 5 cmH2O
Patient temperature: 37
Patient temperature: 37
Patient temperature: 37
pCO2 arterial: 41 mmHg (ref 32–48)
pCO2 arterial: 39 mmHg (ref 32–48)
pCO2 arterial: 58 mmHg — ABNORMAL HIGH (ref 32–48)
pH, Arterial: 7.43 (ref 7.35–7.45)
pH, Arterial: 7.45 (ref 7.35–7.45)
pH, Arterial: 7.34 — ABNORMAL LOW (ref 7.35–7.45)
pO2, Arterial: 68 mmHg — ABNORMAL LOW (ref 83–108)
pO2, Arterial: 56 mmHg — ABNORMAL LOW (ref 83–108)
pO2, Arterial: 71 mmHg — ABNORMAL LOW (ref 83–108)

## 2024-08-13 LAB — BASIC METABOLIC PANEL WITH GFR
Anion gap: 10 (ref 5–15)
BUN: 46 mg/dL — ABNORMAL HIGH (ref 8–23)
CO2: 26 mmol/L (ref 22–32)
Calcium: 8.6 mg/dL — ABNORMAL LOW (ref 8.9–10.3)
Chloride: 91 mmol/L — ABNORMAL LOW (ref 98–111)
Creatinine, Ser: 1.16 mg/dL (ref 0.61–1.24)
GFR, Estimated: >60 mL/min (ref >60)
Glucose, Bld: 129 mg/dL — ABNORMAL HIGH (ref 70–99)
Potassium: 3.5 mmol/L (ref 3.5–5.1)
Sodium: 128 mmol/L — ABNORMAL LOW (ref 135–145)

## 2024-08-13 LAB — HEPATIC FUNCTION PANEL
ALT: 5 U/L (ref 0–44)
AST: 17 U/L (ref 15–41)
Albumin: 2.5 g/dL — ABNORMAL LOW (ref 3.5–5.0)
Alkaline Phosphatase: 63 U/L (ref 38–126)
Bilirubin, Direct: 0.4 mg/dL — ABNORMAL HIGH (ref 0.0–0.2)
Indirect Bilirubin: 0.2 mg/dL — ABNORMAL LOW (ref 0.3–0.9)
Total Bilirubin: 0.6 mg/dL (ref 0.0–1.2)
Total Protein: 5.5 g/dL — ABNORMAL LOW (ref 6.5–8.1)

## 2024-08-13 LAB — URINE CULTURE: Culture: NO GROWTH

## 2024-08-13 LAB — MAGNESIUM: Magnesium: 2.3 mg/dL (ref 1.7–2.4)

## 2024-08-13 LAB — PROTIME-INR
INR: 1.1 (ref 0.8–1.2)
Prothrombin Time: 15.3 s — ABNORMAL HIGH (ref 11.4–15.2)

## 2024-08-13 LAB — PHOSPHORUS: Phosphorus: 2.9 mg/dL (ref 2.5–4.6)

## 2024-08-13 LAB — APTT: aPTT: 34 s (ref 24–36)

## 2024-08-13 MED ORDER — ETOMIDATE 2 MG/ML IV SOLN
INTRAVENOUS | Status: AC
  Filled 2024-08-13: qty 10

## 2024-08-13 MED ORDER — DOPAMINE-DEXTROSE 3.2-5 MG/ML-% IV SOLN
0.0000 ug/kg/min | INTRAVENOUS | Status: DC
  Administered 2024-08-13: 6 ug/kg/min via INTRAVENOUS

## 2024-08-13 MED ORDER — ROCURONIUM BROMIDE 10 MG/ML (PF) SYRINGE
PREFILLED_SYRINGE | INTRAVENOUS | Status: AC
Start: 2024-08-13 — End: 2024-08-13
  Filled 2024-08-13: qty 10

## 2024-08-13 MED ORDER — NOREPINEPHRINE 16 MG/250ML-% IV SOLN
0.0000 ug/min | INTRAVENOUS | Status: DC
  Administered 2024-08-13: 30 ug/min via INTRAVENOUS
  Administered 2024-08-14: 24 ug/min via INTRAVENOUS
  Administered 2024-08-15: 11 ug/min via INTRAVENOUS
  Administered 2024-08-16: 16 ug/min via INTRAVENOUS
  Filled 2024-08-13 (×4): qty 250

## 2024-08-13 MED ORDER — POTASSIUM CHLORIDE 10 MEQ/100ML IV SOLN
10.0000 meq | INTRAVENOUS | Status: DC
  Filled 2024-08-13 (×2): qty 100

## 2024-08-13 MED ORDER — POLYETHYLENE GLYCOL 3350 17 G PO PACK
17.0000 g | PACK | Freq: Every day | ORAL | Status: DC
  Administered 2024-08-14 – 2024-08-16 (×3): 17 g
  Filled 2024-08-13 (×3): qty 1

## 2024-08-13 MED ORDER — AMIODARONE HCL IN DEXTROSE 360-4.14 MG/200ML-% IV SOLN: 30.0000 mg/h | INTRAVENOUS | Status: DC

## 2024-08-13 MED ORDER — POTASSIUM PHOSPHATES 15 MMOLE/5ML IV SOLN
15.0000 mmol | Freq: Once | INTRAVENOUS | Status: DC
  Filled 2024-08-13: qty 5

## 2024-08-13 MED ORDER — FUROSEMIDE 10 MG/ML IJ SOLN
40.0000 mg | Freq: Once | INTRAMUSCULAR | Status: AC
  Administered 2024-08-13: 40 mg via INTRAVENOUS
  Filled 2024-08-13: qty 4

## 2024-08-13 MED ORDER — LACTATED RINGERS IV BOLUS
1000.0000 mL | Freq: Once | INTRAVENOUS | Status: AC
  Administered 2024-08-13: 1000 mL via INTRAVENOUS

## 2024-08-13 MED ORDER — AMIODARONE IV BOLUS ONLY 150 MG/100ML: 150.0000 mg | Freq: Once | INTRAVENOUS | Status: AC

## 2024-08-13 MED ORDER — MIDAZOLAM HCL 2 MG/2ML IJ SOLN
INTRAMUSCULAR | Status: AC
  Administered 2024-08-13: 2 mg
  Filled 2024-08-13: qty 4

## 2024-08-13 MED ORDER — DOPAMINE-DEXTROSE 3.2-5 MG/ML-% IV SOLN
INTRAVENOUS | Status: AC
Start: 2024-08-13 — End: 2024-08-13
  Filled 2024-08-13: qty 250

## 2024-08-13 MED ORDER — MIDAZOLAM HCL (PF) 2 MG/2ML IJ SOLN: 2.0000 mg | Freq: Once | INTRAMUSCULAR | Status: AC

## 2024-08-13 MED ORDER — FUROSEMIDE 10 MG/ML IJ SOLN
20.0000 mg | Freq: Once | INTRAMUSCULAR | Status: AC
  Administered 2024-08-13: 20 mg via INTRAVENOUS
  Filled 2024-08-13: qty 2

## 2024-08-13 MED ORDER — DOCUSATE SODIUM 50 MG/5ML PO LIQD
100.0000 mg | Freq: Two times a day (BID) | ORAL | Status: DC
  Administered 2024-08-14 – 2024-08-16 (×4): 100 mg
  Filled 2024-08-13 (×4): qty 10

## 2024-08-13 MED ORDER — AMIODARONE IV BOLUS ONLY 150 MG/100ML
INTRAVENOUS | Status: AC
Start: 2024-08-13 — End: 2024-08-13
  Filled 2024-08-13: qty 100

## 2024-08-13 MED ORDER — LIDOCAINE HCL 1 % IJ SOLN
INTRAMUSCULAR | Status: AC
  Filled 2024-08-13: qty 10

## 2024-08-13 MED ORDER — FENTANYL CITRATE (PF) 50 MCG/ML IJ SOSY: 50.0000 ug | PREFILLED_SYRINGE | INTRAMUSCULAR | Status: DC | PRN

## 2024-08-13 MED ORDER — POTASSIUM PHOSPHATES 15 MMOLE/5ML IV SOLN
15.0000 mmol | Freq: Once | INTRAVENOUS | Status: AC
  Administered 2024-08-13: 15 mmol via INTRAVENOUS
  Filled 2024-08-13: qty 5

## 2024-08-13 MED ORDER — ROCURONIUM BROMIDE 10 MG/ML (PF) SYRINGE
100.0000 mg | PREFILLED_SYRINGE | Freq: Once | INTRAVENOUS | Status: AC
  Administered 2024-08-13: 100 mg via INTRAVENOUS

## 2024-08-13 MED ORDER — ORAL CARE MOUTH RINSE
15.0000 mL | OROMUCOSAL | Status: DC
  Administered 2024-08-13 – 2024-08-16 (×34): 15 mL via OROMUCOSAL

## 2024-08-13 MED ORDER — AMIODARONE IV BOLUS ONLY 150 MG/100ML
150.0000 mg | Freq: Once | INTRAVENOUS | Status: AC
  Administered 2024-08-13: 150 mg via INTRAVENOUS

## 2024-08-13 MED ORDER — HEPARIN (PORCINE) 25000 UT/250ML-% IV SOLN
1100.0000 [IU]/h | INTRAVENOUS | Status: DC
  Administered 2024-08-13: 850 [IU]/h via INTRAVENOUS
  Administered 2024-08-14 – 2024-08-16 (×2): 900 [IU]/h via INTRAVENOUS
  Filled 2024-08-13 (×3): qty 250

## 2024-08-13 MED ORDER — ORAL CARE MOUTH RINSE: 15.0000 mL | OROMUCOSAL | Status: DC | PRN

## 2024-08-13 MED ORDER — SODIUM CHLORIDE 0.9 % IV SOLN: INTRAVENOUS | Status: AC | PRN

## 2024-08-13 MED ORDER — AMIODARONE IV BOLUS ONLY 150 MG/100ML
INTRAVENOUS | Status: AC
  Administered 2024-08-13: 150 mg via INTRAVENOUS
  Filled 2024-08-13: qty 100

## 2024-08-13 MED ORDER — ETOMIDATE 2 MG/ML IV SOLN
20.0000 mg | Freq: Once | INTRAVENOUS | Status: AC
  Administered 2024-08-13: 20 mg via INTRAVENOUS

## 2024-08-13 MED ORDER — FENTANYL CITRATE (PF) 50 MCG/ML IJ SOSY
50.0000 ug | PREFILLED_SYRINGE | INTRAMUSCULAR | Status: DC | PRN
  Filled 2024-08-13: qty 1

## 2024-08-13 MED ORDER — MIDAZOLAM HCL (PF) 2 MG/2ML IJ SOLN
2.0000 mg | Freq: Once | INTRAMUSCULAR | Status: AC
  Administered 2024-08-13: 2 mg via INTRAVENOUS

## 2024-08-13 MED ORDER — PROPOFOL 1000 MG/100ML IV EMUL
0.0000 ug/kg/min | INTRAVENOUS | Status: DC
  Administered 2024-08-13: 10 ug/kg/min via INTRAVENOUS
  Filled 2024-08-13: qty 100

## 2024-08-13 MED ORDER — AMIODARONE HCL IN DEXTROSE 360-4.14 MG/200ML-% IV SOLN
60.0000 mg/h | INTRAVENOUS | Status: AC
  Administered 2024-08-13: 60 mg/h via INTRAVENOUS
  Filled 2024-08-13: qty 200

## 2024-08-13 NOTE — Procedures (Signed)
 Arterial Catheter Insertion Procedure Note  Eric Hatfield  [id number]  19-Mar-1956  Date:08/13/24  Time:4:11 PM    Provider Performing: Robet Kim    Procedure: Insertion of Arterial Line (63379) with US  guidance (23062)   Indication(s) Blood pressure monitoring and/or need for frequent ABGs  Consent Risks of the procedure as well as the alternatives and risks of each were explained to the patient and/or caregiver.  Consent for the procedure was obtained and is signed in the bedside chart  Anesthesia None   Time Out Verified patient identification, verified procedure, site/side was marked, verified correct patient position, special equipment/implants available, medications/allergies/relevant history reviewed, required imaging and test results available.   Sterile Technique Maximal sterile technique including full sterile barrier drape, hand hygiene, sterile gown, sterile gloves, mask, hair covering, sterile ultrasound probe cover (if used).   Procedure Description Area of catheter insertion was cleaned with chlorhexidine  and draped in sterile fashion. With real-time ultrasound guidance an arterial catheter was placed into the right radial artery.  Appropriate arterial tracings confirmed on monitor.     Complications/Tolerance None; patient tolerated the procedure well.   EBL Minimal   Specimen(s) None  Robet Kim, PA-C Pulmonary/Critical Care PCCM Team Contact Info: [phone]

## 2024-08-13 NOTE — Procedures (Signed)
 Central Venous Catheter Insertion Procedure Note  Hermenegildo Clausen  [id number]  1955-12-16  Date:08/13/24  Time:4:09 PM   Provider Performing:Adora Yeh   Procedure: Insertion of Non-tunneled Central Venous Catheter(36556) with US  guidance (23062)   Indication(s) Medication administration and Difficult access  Consent Risks of the procedure as well as the alternatives and risks of each were explained to the patient and/or caregiver.  Consent for the procedure was obtained and is signed in the bedside chart  Anesthesia Topical only with 1% lidocaine    Timeout Verified patient identification, verified procedure, site/side was marked, verified correct patient position, special equipment/implants available, medications/allergies/relevant history reviewed, required imaging and test results available.  Sterile Technique Maximal sterile technique including full sterile barrier drape, hand hygiene, sterile gown, sterile gloves, mask, hair covering, sterile ultrasound probe cover (if used).  Procedure Description Area of catheter insertion was cleaned with chlorhexidine  and draped in sterile fashion.  With real-time ultrasound guidance a central venous catheter was placed into the right internal jugular vein. Nonpulsatile blood flow and easy flushing noted in all ports.  The catheter was sutured in place and sterile dressing applied.  Complications/Tolerance None; patient tolerated the procedure well. Chest X-Westly is ordered to verify placement for internal jugular or subclavian cannulation.   Chest x-Ishaaq is not ordered for femoral cannulation.  EBL Minimal  Specimen(s) None   Robet Kim, PA-C Pulmonary/Critical Care PCCM Team Contact Info: [phone]

## 2024-08-13 NOTE — Progress Notes (Signed)
 PHARMACY - ANTICOAGULATION CONSULT NOTE  Pharmacy Consult for Heparin  Infusion Indication: atrial fibrillation- no bolus, low goal given low PLTs  No Known Allergies  Patient Measurements: Height: 5' 8 (172.7 cm) Weight: 65.1 kg (143 lb 8.3 oz) IBW/kg (Calculated) : 68.4 HEPARIN  DW (KG): 61.2  Vital Signs: Temp: 96.4 F (35.8 C) (11/23 1448) Temp Source: Axillary (11/23 1448) BP: 124/79 (11/23 1545) Pulse Rate: 73 (11/23 1630)  Labs: Recent Labs    08/11/24 1129 08/12/24 0341 08/13/24 0445 08/13/24 1104  HGB 10.1* 8.9* 8.1*  --   HCT 28.0* 25.0* 23.3*  --   PLT 114* 86* 61*  --   APTT  --   --   --  34  LABPROT  --   --   --  15.3*  INR  --   --   --  1.1  CREATININE 1.66* 1.27* 1.16  --     Estimated Creatinine Clearance: 56.9 mL/min (by C-G formula based on SCr of 1.16 mg/dL).   Medical History: Past Medical History:  Diagnosis Date   Bipolar disorder (HCC)    Hypertension     Medications:  SQH for DVT ppx- last dose 11/23 @ 0511  Assessment: Patient is a 68 year old male with a past medical history of bipolar disorder, smoking history and recent history of dysphagia and GERD and choking on food who presented with septic shock likely 2/2 pneumonia vs post-obstructive pneumonia. Pharmacy has been consulted to initiate patient on a heparin  infusion for Afib.   Baseline INR 1.1 and aPTT 34. Hgb 8.1. PLT 61  Goal of Therapy:  Heparin  level 03-0.5 units/ml Monitor platelets by anticoagulation protocol: Yes   Plan:  Per discussion with PA, will do no bolus, low goal given low PLTs Start heparin  infusion at a rate of 850 units/hr Check heparin  level in 6 hours Monitor CBC daily - watch PLTS and signs/symptoms of bleeding closely   Lum VEAR Mania, PharmD, BCPS 08/13/2024,5:01 PM

## 2024-08-13 NOTE — Progress Notes (Signed)
 Early afternoon patient became severely hypoxic barely saturating 89% on BiPAP at FiO2 of 100%.  Appears to be that he has advanced stage lung cancer and this acute worsening in his respiratory status is secondary to third spacing leading to pulmonary edema in the setting of fluid resuscitation.  I discussed with all his family including his mom and his dad that the next steps would be intubation to protect his airway and try to oxygenate him better however I am not sure what we can achieve and if he can reach a point where we cannot extubate.  Collectively the family are in agreement to escalate care to intubation.  We proceeded with intubation and removed a thick mucous plug at the vocal cord.  Intubation was without any complications.  We then proceeded with bronchoscopy and BAL of the right upper lobe.  This was sent for cytology and micro bronchoscopy did not show any extensive purulent secretions.  Chest x-Davonta showed good placement of ET tube and central line.  It did show some vascular congestion specifically worsening on the left side.  With differential including lymphangitic spread but this is too rapid and therefore raising concern for pulmonary edema.  We ordered another dose of IV Lasix  40 mg x 1. Can consider a spot dose lasix  tonight as well.   Critical Care time: 30 minutes.   Darrin Barn, MD [place] [facility] 08/13/2024 4:59 PM

## 2024-08-13 NOTE — Plan of Care (Signed)
  Problem: Education: Goal: Knowledge of General Education information will improve Description: Including pain rating scale, medication(s)/side effects and non-pharmacologic comfort measures Outcome: Progressing   Problem: Clinical Measurements: Goal: Ability to maintain clinical measurements within normal limits will improve Outcome: Progressing   Problem: Coping: Goal: Level of anxiety will decrease Outcome: Progressing   Problem: Pain Managment: Goal: General experience of comfort will improve and/or be controlled Outcome: Progressing   Problem: Safety: Goal: Ability to remain free from injury will improve Outcome: Progressing   Problem: Activity: Goal: Risk for activity intolerance will decrease Outcome: Not Progressing   Problem: Nutrition: Goal: Adequate nutrition will be maintained Outcome: Not Progressing

## 2024-08-13 NOTE — Care Management Important Message (Signed)
 Important Message  Patient Details  Name: Eric Hatfield. MRN: [id number] Date of Birth: May 08, 1956   Important Message Given:  Yes - Medicare IM     Rojelio SHAUNNA Rattler 08/13/2024, 5:29 PM

## 2024-08-13 NOTE — Consult Note (Signed)
 Consultation Note Date: 08/13/2024   Patient Name: Eric Hatfield.  DOB: 1956-02-16  MRN: [id number]  Age / Sex: 68 y.o., male  PCP: Gasper Nancyann BRAVO, MD Referring Physician: Malka Domino, MD  Reason for Consultation: Establishing goals of care   HPI/Brief Hospital Course: 68 y.o. male  with past medical history of bipolar disorder and current cigarette smoker admitted from home on 08/11/2024 with generalized weakness.  Reportedly from family Eric Hatfield experiencing symptoms of GERD and dysphagia for several days.  Eric Hatfield developed shortness of breath and weakness which led to ED evaluation.  Found to be in shock likely secondary to community-acquired pneumonia, admitted to ICU for vasopressor support  CT chest obtained revealing extensive right lower lobe and right middle lobe consolidations, small pleural effusion, thickened esophagus and masslike consolidation in the right middle lobe  Palliative medicine was consulted for assisting with goals of care conversations.  Subjective:  Extensive chart review has been completed prior to meeting patient including labs, vital signs, imaging, progress notes, orders, and available advanced directive documents from current and previous encounters.  Visited with Eric Hatfield at his bedside.  He is awake, alert, has recently been placed on BiPAP due to significant hypoxia.  He is able to follow commands and communicate somewhat through BiPAP mask although this is challenging.  Wife and stepdaughter at bedside during time of visit.  Introduced myself as a publishing rights manager as a member of the palliative care team. Explained palliative medicine is specialized medical care for people living with serious illness. It focuses on providing relief from the symptoms and stress of a serious illness. The goal is to improve quality of life for both the patient and the family.   Assessed symptoms.  [name] denies acute pain or discomfort, reports improvement in his breathing since being placed on BiPAP.  Spoke with Eric Hatfield and family regarding brief overview of hospitalization and most recent medical updates.  Discussed concern related to Eric Hatfield's significant hypoxia earlier today requiring maximum support on BiPAP.  During our conversation it is apparent that Eric Hatfield struggles with full understanding of complex medical decisions.  It is noted from CCM team notes that Eric Hatfield likely has cognitive impairment.  Stepdaughter at bedside attempting to explain medical situations to Mrs. Mainville.  Ms. Mode becomes easily flustered and irritated when discussing goals of care.  We discussed patient's current illness and what it means in the larger context of patient's on-going co-morbidities. Natural disease trajectory and expectations at EOL were discussed.   Due to the severity of his hypoxia we discussed in the event of further decline next step after BiPAP is endotracheal intubation for mechanical ventilation.  We discussed this process in great detail.  Unfortunately Eric Hatfield at this time unable to fully verbalize his wishes and desires.  Eric Hatfield requested that I call and speak to Eric Hatfield's mother Eric Hatfield.  Spoke with Eric Hatfield on the phone and again reviewed hospitalization as well as most recent medical updates.  Discussed concern related to significant hypoxia and maximum support needed from BiPAP.  We again discussed in the event of further decline need for endotracheal intubation with mechanical ventilation.  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  Eric Hatfield is clear that she, Eric Hatfield father are accepting of all treatments and interventions to preserve life.  Eric Hatfield is clear family would like for Eric Hatfield to remain  full code/full scope.  After conversation with  Eric Hatfield, explained conversation had with Eric Hatfield to Eric Hatfield.  Eric Hatfield in agreement with full code/full scope and is accepting of any and all interventions to prolong life.  Ms. Covalt repeatedly refers to dosing of Depakote  that she feels led to this event.  Eric Hatfield shares that she feels with the current dose of Depakote  it has caused [name] to be less active and spends the majority of time in bed.  Upon further investigation, Ms. Haymer shares this current dose of Depakote  has been in place for the last 3 years and during that time he has been less active--does not seem to be an acute change in his functional status.  During our conversations Eric Hatfield is able to share he does not have any biological children.  In the event he is unable to verbalize his own wishes and desires he wishes to appoint his wife Ms. Mckeon as runner, broadcasting/film/video--- significant concern related to Ms. Sandner's ability to fully understand complex medical decisions--would advise to also involve Mr. Delahoussaye's mother and father.  I discussed importance of continued conversations with family/support persons and all members of their medical team regarding overall plan of care and treatment options ensuring decisions are in alignment with patients goals of care.  All questions/concerns addressed. Emotional support provided to patient/family/support persons. PMT will continue to follow and support patient as needed.  Objective: Primary Diagnoses: Present on Admission:  Community acquired pneumonia   Physical Exam Constitutional:      General: He is not in acute distress.    Appearance: He is ill-appearing.  HENT:     Head: Normocephalic.     Mouth/Throat:     Mouth: Mucous membranes are dry.  Pulmonary:     Effort: Pulmonary effort is normal. No respiratory distress.  Abdominal:     General: There is no distension.     Palpations: Abdomen is soft.     Tenderness: There is  no abdominal tenderness.  Skin:    General: Skin is warm and dry.     Findings: Bruising present.  Neurological:     Mental Status: He is alert and oriented to person, place, and time.     Motor: Weakness present.  Psychiatric:        Mood and Affect: Mood normal.        Behavior: Behavior normal.        Thought Content: Thought content normal.     Vital Signs: BP 124/79   Pulse (!) 101   Temp (!) 96.4 F (35.8 C) (Axillary)   Resp 16   Ht 5' 8 (1.727 m)   Wt 65.1 kg   SpO2 (!) 88%   BMI 21.82 kg/m  Pain Scale: CPOT   Pain Score: 0-No pain  IO: Intake/output summary:  Intake/Output Summary (Last 24 hours) at 08/13/2024 1822 Last data filed at 08/13/2024 1745 Gross per 24 hour  Intake 2698.56 ml  Output 1615 ml  Net 1083.56 ml    LBM: Last BM Date :  (PTA) Baseline Weight: Weight: 62.7 kg Most recent weight: Weight: 65.1 kg       Assessment and Plan  SUMMARY OF RECOMMENDATIONS   Full code/full scope PMT to continue to follow for ongoing needs and support  Palliative Prophylaxis:   Bowel Regimen, Delirium Protocol and Frequent Pain Assessment  Discussed With: CCM team and nursing staff   Thank you for this consult and allowing Palliative Medicine to participate  in the care of Martine a Borboa Jr. Palliative medicine will continue to follow and assist as needed.   Visit includes: Detailed review of medical records (labs, imaging, vital signs), medically appropriate exam (mental status, respiratory, cardiac, skin), discussed with treatment team, counseling and educating patient, family and staff, documenting clinical information, medication management and coordination of care.   Signed by: Waddell Lesches, DNP, AGNP-C Palliative Medicine    Please contact Palliative Medicine Team phone at [phone] for questions and concerns.  For individual provider: See Tracey

## 2024-08-13 NOTE — Progress Notes (Signed)
 PHARMACY CONSULT NOTE  Pharmacy Consult for Electrolyte Monitoring and Replacement   Recent Labs: Potassium (mmol/L)  Date Value  08/13/2024 3.5   Magnesium  (mg/dL)  Date Value  88/76/7974 2.3   Calcium  (mg/dL)  Date Value  88/76/7974 8.6 (L)   Albumin  (g/dL)  Date Value  88/77/7974 2.1 (L)  11/30/2023 3.8 (L)   Phosphorus (mg/dL)  Date Value  88/76/7974 2.9   Sodium (mmol/L)  Date Value  08/13/2024 128 (L)  11/30/2023 132 (L)   Assessment: 68 y.o. male w/ PMH of bipolar d/o, HTN, GERD, dysphagia, h/o intestinal disaccharide deficiency  who presents to the ED for evaluation of an abnormal lab and hypotension. Pharmacy is asked to follow and replace electrolytes while in CCU   Goal of Therapy:  Electrolytes WNL  Plan:  --Na 128, Cl 91. Continue to monitor --Give potassium phosphate  15 mmol IV x 1. Target phos ~ 4 with respiratory failure --Re-check electrolytes tomorrow AM  Eric Hatfield 08/13/2024 7:00 AM

## 2024-08-13 NOTE — Progress Notes (Signed)
 NAME:  Eric Holaway., MRN:  [id number], DOB:  1956/08/22, LOS: 2 ADMISSION DATE:  08/11/2024, CONSULTATION DATE:  08/11/24 REFERRING MD:  Ester Sharps, MD CHIEF COMPLAINT:  Hypotension; abnormal labs   History of Present Illness:  Case of 68 year old male patient with a past medical history of bipolar disorder, smoking history and recent history of dysphagia and GERD and choking on food presenting to [facility] on 11/21 due to generalized weakness hypotension and shortness of breath.  He was found to be in shock likely secondary to community-acquired pneumonia requiring ICU admission for norepinephrine .   Per wife who is at bedside who also has intellectual disability, Eric Hatfield has been having weakness and lethargy for the past week.  She emphasizes that he has been choking on food and having GERD for the past month.  She does report some weight loss but could not tell me how much.   Per chart review he had a fall about 4 days ago and cannot get around the house and was recommended to go to the emergency department.   In the ED, initially on room air however post fluid resuscitation for hypotension he was placed on 3 L nasal cannula.   Labs with elevated white count at 16.8 with left shift 87% neutrophils.  Creatinine of 1.66 mg/dL from a baseline of 0.9.  Hypokalemia with potassium of 2.9.  Lactic acidosis with a lactate of 2.5.   Chest x-Aki with extensive patchy opacification of the right lower lobe.   Bedside ultrasound of the right hemithorax without any signs of empyema but did show extensive consolidation.   CT chest without contrast with extensive right lower lobe and right middle lobe consolidations.  Small pleural effusion.  Thickened esophagus.  And masslike consolidation in the right middle lobe.   Of note he is a smoker and was enrolled in the lung cancer screening program and had a CT chest in 2023 with a 7.8 mm right middle lobe nodule.  Pertinent Medical History   Hypertension Bipolar Disorder  Significant Hospital Events: Including procedures, antibiotic start and stop dates in addition to other pertinent events   11/21: Admitted to the ICU for levophed  support 11/22: Remains critically ill requiring HHFNC and now bipap for hypoxia. Increasing pressor requirements.  11/23: Remains critically ill with increased O2 requirements overnight on Bipap. Levophed  at . Intubated at bedside and arterial line placed.  Interim History / Subjective:  See above listed under Significant Hospital Events.  Objective    Blood pressure 105/62, pulse (!) 53, temperature 97.9 F (36.6 C), temperature source Oral, resp. rate 19, height 5' 8 (1.727 m), weight 65.1 kg, SpO2 (!) 89%.    FiO2 (%):  [60 %-98 %] 98 %   Intake/Output Summary (Last 24 hours) at 08/13/2024 0720 Last data filed at 08/13/2024 0545 Gross per 24 hour  Intake 1275.75 ml  Output 940 ml  Net 335.75 ml   Filed Weights   08/11/24 1548 08/12/24 0500 08/13/24 0500  Weight: 61.2 kg 66 kg 65.1 kg    Examination: General: critically ill male, mechanically intubated and sedated HENT: atraumatic, normocephalic, supple, no jvd Lungs: mildly diminished bilaterally with rhonchi and wheezing Cardiovascular: RRR, S1 S2, no m/r/g Abdomen: soft, non-tender, non-distended Extremities: warm, dry, intact, radial pulses 2+, distal pulses 1+, no edema, right radial arterial line in place Neuro: intubated and sedated, no focal neuro deficits, PERRL GU: deferred, foley in place draining yellow urine  Resolved problem list   Assessment and  Plan   #Toxic Metabolic Encephalopathy #Bipolar Disorder #Sedation in the setting of Mechanical Ventilation - PAD protocol - Maintain RASS goal of of -1 - Propofol  gtt for sedation and prn fentanyl   - Daily WUA - Correct metabolic derangements - Continue home meds: Risperidone  1mg  daily and Lexapro  5mg  daily - Encourage appropriate sleep/wake cycle -  Family support at bedside when able  #Septic Shock secondary to CAP vs. Post-Obstructive Pneumonia #AGMA iso Septic Shock with Lactic Acidosis ~ Resolved #Elevated Troponin suspected d/t Demand Ischemia ~ Resolved #Atrial Fibrillation  11/22 ECHO: EF 65-70%, mild LVH, mild mitral valve stenosis, mild-moderate aortic stenosis with mild dilatation of aortic root (40mm); IVC dilated with <50% variability suggesting RAP of . - Continuous cardiac monitoring - Vasopressors to maintain MAP goal >65 - Remains on levo at 2mcg - Intermittent use of dopamine ; not currently requiring - CVC placed in right IJ - Continue stress dose steroids - EKG as indicated - Trend lactic; now normalized - Troponin peaked at 44 - Amiodarone  ordered for A Fib  #Acute Hypoxic Respiratory Failure s/t CAP requiring Mechanical Ventilation #Bilateral Pleural Effusions #Pulmonary Edema #Suspected Right Empyema #Right Lung Mass 11/22 Chest CTA PE: Confulent areas of heterogeneous hypoenhancement in the right lung are more consistent with multifocal tumor than consolidation, up to 9.8 cm each. Underlying Emphysema. Bilateral layering pleural effusions have mildly progressed since yesterday, with increased dependent and lower lobe atelectasis. No evidence of pulmonary embolism. Indeterminant T6 and T10 compression fractures are stable. - Full vent support - FiO2 and PEEP to maintain O2 sat of >92% - Maintain plateau pressures less than 30 cm H2O - Ensure pulmonary hygiene - VAP - Duonebs prn - Intermittent CXR and ABG - Will consider chest tube placement - Consider biopsy next week for lung nodules - Bronchoscopy performed today, sending off cultures  #AKI s/t Distributive Shock #Hypokalemia #Hyponatremia - Strict I/O - Trend Bmp and monitor renal function; Creatinine wnl - Avoid nephrotoxins as able - Ensure renal perfusion - ICU electrolyte replacement protocol - Pharmacy to assist with  replacement  #Esophageal Dysmotility #Dysphagia suspected d/t Achalasia #GERD #Hypoalbuminemia Hx: GERD, dysphagia, intestinal disaccharide deficiency - PPI: protonix  - Diet: NPO - Constipation protocol prn - Previously received Albumin  12.5g BID x 2 doses  #Acute Anemia #Thrombocytopenia #DVT Prophylaxis - Trend CBC and monitor H/H and platelets - Hgb 8.1 and platelets 61 - Transfuse if Hgb < 7 - Monitor for s/sx of bleeding - DVT ppx: SQ heparin   #Hyperglycemia - ICU hypo/hyperglycemia protocol - CBG Q4h - SSI - Range 140-180  #Community Acquired Pneumonia vs Post-Obstructive Pneumonia - Trend WBC and monitor fever curve - Continue Azithromycin  and Zosyn  - Narrow ABX pending culture and sensitivities - Strep pneumo urinary antigen negative - Procalcitonin 1.1 - Respiratory PCR (flu/covid/rsv): negative - Blood cultures x 2: no growth to date  Labs   CBC: Recent Labs  Lab 08/11/24 1129 08/12/24 0341 08/13/24 0445  WBC 16.8* 17.8* 12.9*  NEUTROABS 14.6*  --  11.6*  HGB 10.1* 8.9* 8.1*  HCT 28.0* 25.0* 23.3*  MCV 92.4 91.9 94.0  PLT 114* 86* 61*    Basic Metabolic Panel: Recent Labs  Lab 08/11/24 1129 08/12/24 0341 08/12/24 1559 08/12/24 1732 08/13/24 0445  NA 131* 130*  --   --  128*  K 2.9* 3.0* 3.3* 3.4* 3.5  CL 87* 90*  --   --  91*  CO2 27 27  --   --  26  GLUCOSE 107* 155*  --   --  129*  BUN 59* 49*  --   --  46*  CREATININE 1.66* 1.27*  --   --  1.16  CALCIUM  8.9 8.3*  --   --  8.6*  MG 2.3 2.2  --   --  2.3  PHOS 3.6 3.8  --   --  2.9   GFR: Estimated Creatinine Clearance: 56.9 mL/min (by C-G formula based on SCr of 1.16 mg/dL). Recent Labs  Lab 08/11/24 1129 08/11/24 1205 08/11/24 1330 08/11/24 1653 08/12/24 0341 08/12/24 1559 08/13/24 0445  PROCALCITON  --   --  1.10  --   --   --   --   WBC 16.8*  --   --   --  17.8*  --  12.9*  LATICACIDVEN  --  2.5*  --  1.5  --  0.9  --     Liver Function Tests: Recent Labs  Lab  08/11/24 1129 08/12/24 0341  AST 24  --   ALT 7  --   ALKPHOS 73  --   BILITOT 0.9  --   PROT 6.4*  --   ALBUMIN  2.7* 2.1*   Recent Labs  Lab 08/11/24 1129  LIPASE 16   No results for input(s): AMMONIA in the last 168 hours.  ABG    Component Value Date/Time   PHART 7.43 08/13/2024 0455   PCO2ART 41 08/13/2024 0455   PO2ART 68 (L) 08/13/2024 0455   HCO3 27.2 08/13/2024 0455   O2SAT 96 08/13/2024 0455     Coagulation Profile: No results for input(s): INR, PROTIME in the last 168 hours.  Cardiac Enzymes: No results for input(s): CKTOTAL, CKMB, CKMBINDEX, TROPONINI in the last 168 hours.  HbA1C: Hgb A1c MFr Bld  Date/Time Value Ref Range Status  04/04/2022 04:40 AM 5.7 (H) 4.8 - 5.6 % Final    Comment:    (NOTE) Pre diabetes:          5.7%-6.4%  Diabetes:              >6.4%  Glycemic control for   <7.0% adults with diabetes   12/24/2010 12:00 AM 5.8 4.0 - 6.0 % Final    CBG: Recent Labs  Lab 08/11/24 1544  GLUCAP 112*    Review of Systems:   Unable to assess as patient is intubated and sedated.  Past Medical History:  He,  has a past medical history of Bipolar disorder (HCC) and Hypertension.   Surgical History:   Past Surgical History:  Procedure Laterality Date   Impacted tooth       Social History:   reports that he has been smoking cigarettes. He started smoking about 49 years ago. He has a 36.9 pack-year smoking history. He has never used smokeless tobacco. He reports that he does not currently use alcohol. He reports that he does not currently use drugs.   Family History:  His family history includes Hyperlipidemia in his father; Hypertension in his brother and mother; Mental illness in his paternal grandmother.   Allergies No Known Allergies   Home Medications  Prior to Admission medications   Medication Sig Start Date End Date Taking? Authorizing Provider  divalproex  (DEPAKOTE ) 250 MG DR tablet Take 250-500 mg by  mouth 2 (two) times daily. Take 250 mg (one tablet) by mouth every morning and 500 mg (two tablets) at bedtime 07/05/24  Yes [provider]  escitalopram  (LEXAPRO ) 5 MG tablet Take 5 mg by mouth daily. 07/03/24  Yes [provider]  irbesartan  (  AVAPRO ) 75 MG tablet TAKE 1 TABLET BY MOUTH DAILY. 09/25/23  Yes Gasper Nancyann BRAVO, MD  RABEprazole  (ACIPHEX ) 20 MG tablet Take 1 tablet (20 mg total) by mouth daily. 07/31/24  Yes Gasper Nancyann BRAVO, MD  risperiDONE  (RISPERDAL ) 1 MG tablet Take 1 mg by mouth at bedtime. 07/03/24  Yes [provider]  rosuvastatin  (CRESTOR ) 5 MG tablet TAKE ONE TABLET BY MOUTH ONCE DAILY 08/03/24  Yes Gasper Nancyann BRAVO, MD     Critical care time: 65 minutes     Dailon Sheeran, PA-C Pulmonary/Critical Care PCCM Team Contact Info: [phone]

## 2024-08-13 NOTE — Procedures (Addendum)
 Intubation Procedure Note  Eric Hatfield  [id number]  [birth year]  Date:08/13/24  Time:2:50 PM   Provider Performing:Eric Hatfield   Procedure: Intubation (31500)  Indication(s) Respiratory Failure  Consent Risks of the procedure as well as the alternatives and risks of each were explained to the patient and/or caregiver.  Consent for the procedure was obtained and is signed in the bedside chart  Anesthesia Etomidate , Versed , and Rocuronium   Time Out Verified patient identification, verified procedure, site/side was marked, verified correct patient position, special equipment/implants available, medications/allergies/relevant history reviewed, required imaging and test results available.  Sterile Technique Usual hand hygeine, masks, and gloves were used  Procedure Description Patient positioned in bed supine.  Sedation given as noted above.  Patient was intubated with endotracheal tube using Glidescope.  View was Grade 1 full glottis . Thick large mucous plugg at the vocal cords was suctioned. Number of attempts was 1.  Colorimetric CO2 detector was consistent with tracheal placement.  Complications/Tolerance None; patient tolerated the procedure well. Chest X-Eric Hatfield is ordered to verify placement.  EBL Minimal  Specimen(s) None   Eric Barn, MD [place] [facility] 08/13/2024 2:51 PM

## 2024-08-13 NOTE — Procedures (Signed)
 Bronchoscopy Procedure Note  Eric Hatfield  [id number]  07/28/1956  Date:08/13/24  Time:2:52 PM   Provider Performing:Jean-Pierre Anahi [place]mar   Procedure(s):  Flexible bronchoscopy with bronchial alveolar lavage (68375)  Indication(s) Multifocal pneumonia  Consent Risks of the procedure as well as the alternatives and risks of each were explained to the patient and/or caregiver.  Consent for the procedure was obtained and is signed in the bedside chart  Anesthesia Already intubated in the ICU   Time Out Verified patient identification, verified procedure, site/side was marked, verified correct patient position, special equipment/implants available, medications/allergies/relevant history reviewed, required imaging and test results available.   Sterile Technique Usual hand hygiene, masks, gowns, and gloves were used   Procedure Description Bronchoscope advanced through endotracheal tube and into airway.  Airways were examined down to subsegmental level with findings noted below.   Following diagnostic evaluation, BAL(s) performed in RUL with normal saline and return of Clear fluid  Findings: MC Appeared normal. Some thick secretions overlaying that were suctioned and did not recur. Thick secretions in the left main, LUL and LLL that were cleared and did not recur. Thick secretions but not extensive in the right airway tree that were suctioned and cleared. RUL posterior segment was wedged and 100cc of NS was instilled with 30 cc return sent for micro and cyto.   Complications/Tolerance None; patient tolerated the procedure well. Chest X-Wasil is not needed post procedure.  EBL Minimal  Specimen(s) -- RUL BAL - Gram stain and culture and cytology.   Darrin Barn, MD [place] [facility] 08/13/2024 2:55 PM

## 2024-08-13 NOTE — Progress Notes (Signed)
 RT assisted Dr. Malka with Emergent Bedside Bronchoscopy. Consent and Time out obtained and performed by appropriate personnel prior to procedure start. Pt on 100% Fio2.   Ambu disposable bronchoscope Lot#: [id number] used  Time Scope entered in to airway: 1421 Time Scope Removed from airway: 1432  Pt tolerated well. Respiratory Culture and Cytolopy BAL obtained, Labeled,  and taken to lab.

## 2024-08-14 ENCOUNTER — Inpatient Hospital Stay

## 2024-08-14 DIAGNOSIS — J9601 Acute respiratory failure with hypoxia: Secondary | ICD-10-CM

## 2024-08-14 DIAGNOSIS — Z515 Encounter for palliative care: Secondary | ICD-10-CM | POA: Diagnosis not present

## 2024-08-14 DIAGNOSIS — A419 Sepsis, unspecified organism: Secondary | ICD-10-CM

## 2024-08-14 DIAGNOSIS — J189 Pneumonia, unspecified organism: Secondary | ICD-10-CM | POA: Diagnosis not present

## 2024-08-14 DIAGNOSIS — R6521 Severe sepsis with septic shock: Secondary | ICD-10-CM

## 2024-08-14 DIAGNOSIS — R918 Other nonspecific abnormal finding of lung field: Secondary | ICD-10-CM

## 2024-08-14 LAB — MAGNESIUM: Magnesium: 2.1 mg/dL (ref 1.7–2.4)

## 2024-08-14 LAB — CBC WITH DIFFERENTIAL/PLATELET
Abs Immature Granulocytes: 0.86 K/uL — ABNORMAL HIGH (ref 0.00–0.07)
Basophils Absolute: 0.1 K/uL (ref 0.0–0.1)
Basophils Relative: 0 %
Eosinophils Absolute: 0 K/uL (ref 0.0–0.5)
Eosinophils Relative: 0 %
HCT: 26.2 % — ABNORMAL LOW (ref 39.0–52.0)
Hemoglobin: 9.1 g/dL — ABNORMAL LOW (ref 13.0–17.0)
Immature Granulocytes: 5 %
Lymphocytes Relative: 3 %
Lymphs Abs: 0.5 K/uL — ABNORMAL LOW (ref 0.7–4.0)
MCH: 32.9 pg (ref 26.0–34.0)
MCHC: 34.7 g/dL (ref 30.0–36.0)
MCV: 94.6 fL (ref 80.0–100.0)
Monocytes Absolute: 1.1 K/uL — ABNORMAL HIGH (ref 0.1–1.0)
Monocytes Relative: 6 %
Neutro Abs: 15.1 K/uL — ABNORMAL HIGH (ref 1.7–7.7)
Neutrophils Relative %: 86 %
Platelets: 82 K/uL — ABNORMAL LOW (ref 150–400)
RBC: 2.77 MIL/uL — ABNORMAL LOW (ref 4.22–5.81)
RDW: 16.4 % — ABNORMAL HIGH (ref 11.5–15.5)
WBC: 17.6 K/uL — ABNORMAL HIGH (ref 4.0–10.5)
nRBC: 0.5 % — ABNORMAL HIGH (ref 0.0–0.2)

## 2024-08-14 LAB — HEMOGLOBIN A1C
Hgb A1c MFr Bld: 6.5 % — ABNORMAL HIGH (ref 4.8–5.6)
Mean Plasma Glucose: 140 mg/dL

## 2024-08-14 LAB — GLUCOSE, PLEURAL OR PERITONEAL FLUID: Glucose, Fluid: 144 mg/dL

## 2024-08-14 LAB — BLOOD GAS, ARTERIAL
Acid-Base Excess: 5 mmol/L — ABNORMAL HIGH (ref 0.0–2.0)
Bicarbonate: 29.9 mmol/L — ABNORMAL HIGH (ref 20.0–28.0)
FIO2: 100 %
MECHVT: 420 mL
O2 Saturation: 99.5 %
PEEP: 8 cmH2O
Patient temperature: 37
RATE: 16 {breaths}/min
pCO2 arterial: 44 mmHg (ref 32–48)
pH, Arterial: 7.44 (ref 7.35–7.45)
pO2, Arterial: 109 mmHg — ABNORMAL HIGH (ref 83–108)

## 2024-08-14 LAB — BASIC METABOLIC PANEL WITH GFR
Anion gap: 13 (ref 5–15)
BUN: 46 mg/dL — ABNORMAL HIGH (ref 8–23)
CO2: 26 mmol/L (ref 22–32)
Calcium: 8.4 mg/dL — ABNORMAL LOW (ref 8.9–10.3)
Chloride: 89 mmol/L — ABNORMAL LOW (ref 98–111)
Creatinine, Ser: 1.42 mg/dL — ABNORMAL HIGH (ref 0.61–1.24)
GFR, Estimated: 54 mL/min — ABNORMAL LOW (ref >60)
Glucose, Bld: 199 mg/dL — ABNORMAL HIGH (ref 70–99)
Potassium: 3.6 mmol/L (ref 3.5–5.1)
Sodium: 128 mmol/L — ABNORMAL LOW (ref 135–145)

## 2024-08-14 LAB — BODY FLUID CELL COUNT WITH DIFFERENTIAL
Eos, Fluid: 0 %
Lymphs, Fluid: 11 %
Monocyte-Macrophage-Serous Fluid: 5 %
Neutrophil Count, Fluid: 84 %
Total Nucleated Cell Count, Fluid: 2952 uL

## 2024-08-14 LAB — HEPARIN LEVEL (UNFRACTIONATED)
Heparin Unfractionated: 0.23 [IU]/mL — ABNORMAL LOW (ref 0.30–0.70)
Heparin Unfractionated: 0.38 [IU]/mL (ref 0.30–0.70)
Heparin Unfractionated: 0.34 [IU]/mL (ref 0.30–0.70)

## 2024-08-14 LAB — PHOSPHORUS: Phosphorus: 4.3 mg/dL (ref 2.5–4.6)

## 2024-08-14 LAB — LACTATE DEHYDROGENASE, PLEURAL OR PERITONEAL FLUID: LD, Fluid: 353 U/L — ABNORMAL HIGH (ref 3–23)

## 2024-08-14 LAB — GLUCOSE, CAPILLARY
Glucose-Capillary: 186 mg/dL — ABNORMAL HIGH (ref 70–99)
Glucose-Capillary: 153 mg/dL — ABNORMAL HIGH (ref 70–99)
Glucose-Capillary: 127 mg/dL — ABNORMAL HIGH (ref 70–99)
Glucose-Capillary: 111 mg/dL — ABNORMAL HIGH (ref 70–99)

## 2024-08-14 LAB — LEGIONELLA PNEUMOPHILA SEROGP 1 UR AG: L. pneumophila Serogp 1 Ur Ag: NEGATIVE

## 2024-08-14 LAB — PROTEIN, PLEURAL OR PERITONEAL FLUID: Total protein, fluid: <3 g/dL

## 2024-08-14 LAB — TRIGLYCERIDES: Triglycerides: 115 mg/dL (ref <150)

## 2024-08-14 LAB — LACTIC ACID, PLASMA: Lactic Acid, Venous: 1.7 mmol/L (ref 0.5–1.9)

## 2024-08-14 MED ORDER — THIAMINE HCL 100 MG PO TABS
100.0000 mg | ORAL_TABLET | Freq: Every day | ORAL | Status: DC
  Administered 2024-08-15 – 2024-08-16 (×2): 100 mg
  Filled 2024-08-14 (×4): qty 1

## 2024-08-14 MED ORDER — FENTANYL 2500MCG IN NS 250ML (10MCG/ML) PREMIX INFUSION
0.0000 ug/h | INTRAVENOUS | Status: DC
  Administered 2024-08-14: 25 ug/h via INTRAVENOUS
  Administered 2024-08-16: 75 ug/h via INTRAVENOUS
  Filled 2024-08-14 (×2): qty 250

## 2024-08-14 MED ORDER — PROSOURCE TF20 ENFIT COMPATIBL EN LIQD
60.0000 mL | Freq: Every day | ENTERAL | Status: DC
  Administered 2024-08-15 – 2024-08-16 (×2): 60 mL
  Filled 2024-08-14 (×2): qty 60

## 2024-08-14 MED ORDER — LIDOCAINE HCL (PF) 1 % IJ SOLN
10.0000 mL | Freq: Once | INTRAMUSCULAR | Status: AC
  Administered 2024-08-14: 10 mL via INTRADERMAL
  Filled 2024-08-14: qty 10

## 2024-08-14 MED ORDER — SODIUM CHLORIDE 0.9 % IV SOLN: INTRAVENOUS | Status: DC | PRN

## 2024-08-14 MED ORDER — FREE WATER
30.0000 mL | Status: DC
  Administered 2024-08-14 – 2024-08-16 (×12): 30 mL

## 2024-08-14 MED ORDER — POTASSIUM CHLORIDE 20 MEQ PO PACK
40.0000 meq | PACK | Freq: Once | ORAL | Status: AC
  Administered 2024-08-14: 40 meq
  Filled 2024-08-14: qty 2

## 2024-08-14 MED ORDER — VASOPRESSIN 20 UNITS/100 ML INFUSION FOR SHOCK
0.0000 [IU]/min | INTRAVENOUS | Status: DC
  Administered 2024-08-14 – 2024-08-15 (×4): 0.03 [IU]/min via INTRAVENOUS
  Administered 2024-08-16 (×2): 0.04 [IU]/min via INTRAVENOUS
  Filled 2024-08-14 (×7): qty 100

## 2024-08-14 MED ORDER — ESCITALOPRAM OXALATE 10 MG PO TABS
5.0000 mg | ORAL_TABLET | Freq: Every day | ORAL | Status: DC
  Administered 2024-08-14 – 2024-08-16 (×3): 5 mg
  Filled 2024-08-14 (×3): qty 0.5

## 2024-08-14 MED ORDER — INSULIN ASPART 100 UNIT/ML IJ SOLN
0.0000 [IU] | INTRAMUSCULAR | Status: DC
  Administered 2024-08-14: 3 [IU] via SUBCUTANEOUS
  Administered 2024-08-14 (×2): 4 [IU] via SUBCUTANEOUS
  Administered 2024-08-15: 3 [IU] via SUBCUTANEOUS
  Administered 2024-08-15: 4 [IU] via SUBCUTANEOUS
  Administered 2024-08-15: 3 [IU] via SUBCUTANEOUS
  Administered 2024-08-15: 4 [IU] via SUBCUTANEOUS
  Administered 2024-08-15 (×2): 3 [IU] via SUBCUTANEOUS
  Administered 2024-08-16 (×2): 4 [IU] via SUBCUTANEOUS
  Administered 2024-08-16: 7 [IU] via SUBCUTANEOUS
  Filled 2024-08-14: qty 3
  Filled 2024-08-14: qty 4
  Filled 2024-08-14: qty 3
  Filled 2024-08-14 (×2): qty 4
  Filled 2024-08-14: qty 3
  Filled 2024-08-14: qty 7
  Filled 2024-08-14: qty 3
  Filled 2024-08-14 (×2): qty 4
  Filled 2024-08-14: qty 3
  Filled 2024-08-14: qty 4

## 2024-08-14 MED ORDER — VITAL AF 1.2 CAL PO LIQD
1000.0000 mL | ORAL | Status: DC
  Administered 2024-08-14 – 2024-08-15 (×2): 1000 mL

## 2024-08-14 MED ORDER — VITAMIN C 500 MG PO TABS
500.0000 mg | ORAL_TABLET | Freq: Two times a day (BID) | ORAL | Status: DC
  Administered 2024-08-14 – 2024-08-16 (×4): 500 mg
  Filled 2024-08-14 (×4): qty 1

## 2024-08-14 MED ORDER — MIDAZOLAM HCL (PF) 2 MG/2ML IJ SOLN
1.0000 mg | INTRAMUSCULAR | Status: DC | PRN
  Administered 2024-08-15: 2 mg via INTRAVENOUS
  Filled 2024-08-14 (×2): qty 2

## 2024-08-14 NOTE — Procedures (Signed)
 PROCEDURE SUMMARY:  Successful image-guided right-sided diagnostic and therapeutic thoracentesis. Yielded 0.40 liters of hazy, straw-colored pleural fluid. Patient tolerated procedure well. EBL: Zero No immediate complications.   Specimen was sent for labs. Post procedure CXR is pending.   Please see imaging section of Epic for full dictation.  [facility] LABOR Deval Mroczka PA-C 08/14/2024 4:13 PM

## 2024-08-14 NOTE — Progress Notes (Signed)
 NAME:  Eric Bogus., MRN:  [id number], DOB:  1956-03-15, LOS: 3 ADMISSION DATE:  08/11/2024, CONSULTATION DATE:  08/11/24 REFERRING MD:  Ester Sharps, MD  CHIEF COMPLAINT:  shock  History of Present Illness:  Case of 68 year old male patient with a past medical history of bipolar disorder, smoking history and recent history of dysphagia and GERD and choking on food presenting to [facility] on 11/21 due to generalized weakness hypotension and shortness of breath.  He was found to be in shock likely secondary to community-acquired pneumonia requiring ICU admission for norepinephrine .   Per wife who is at bedside who also has intellectual disability, Eric Hatfield has been having weakness and lethargy for the past week.  She emphasizes that he has been choking on food and having GERD for the past month.  She does report some weight loss but could not tell me how much.   Per chart review he had a fall about 4 days ago and cannot get around the house and was recommended to go to the emergency department.   In the ED, initially on room air however post fluid resuscitation for hypotension he was placed on 3 L nasal cannula.   Labs with elevated white count at 16.8 with left shift 87% neutrophils.  Creatinine of 1.66 mg/dL from a baseline of 0.9.  Hypokalemia with potassium of 2.9.  Lactic acidosis with a lactate of 2.5.   Chest x-[place] with extensive patchy opacification of the right lower lobe.   Bedside ultrasound of the right hemithorax without any signs of empyema but did show extensive consolidation.   CT chest without contrast with extensive right lower lobe and right middle lobe consolidations.  Small pleural effusion.  Thickened esophagus.  And masslike consolidation in the right middle lobe.   Of note he is a smoker and was enrolled in the lung cancer screening program and had a CT chest in 2023 with a 7.8 mm right middle lobe nodule.  Pertinent Medical History  Hypertension Bipolar  Disorder  Significant Hospital Events: Including procedures, antibiotic start and stop dates in addition to other pertinent events   11/21: Admitted to the ICU for levophed  support 11/22: Remains critically ill requiring HHFNC and now bipap for hypoxia. Increasing pressor requirements.  11/23: Remains critically ill with increased O2 requirements overnight on Bipap. Levophed  at . Intubated at bedside and arterial line placed.  Interim History / Subjective:  Remains critically ill Remains intubated Severe hypoxia Requires VENT support for survival   Objective    Blood pressure (!) 142/61, pulse (!) 42, temperature 98.4 F (36.9 C), temperature source Axillary, resp. rate 18, height 5' 8 (1.727 m), weight 65.1 kg, SpO2 97%.    Vent Mode: PRVC FiO2 (%):  [80 %-100 %] 100 % Set Rate:  [16 bmp] 16 bmp Vt Set:  [420 mL] 420 mL PEEP:  [5 cmH20-8 cmH20] 8 cmH20   Intake/Output Summary (Last 24 hours) at 08/14/2024 0748 Last data filed at 08/14/2024 0700 Gross per 24 hour  Intake 3007.84 ml  Output 1450 ml  Net 1557.84 ml   Filed Weights   08/12/24 0500 08/13/24 0500 08/14/24 0500  Weight: 66 kg 65.1 kg 65.1 kg      REVIEW OF SYSTEMS  PATIENT IS UNABLE TO PROVIDE COMPLETE REVIEW OF SYSTEMS DUE TO SEVERE CRITICAL ILLNESS   PHYSICAL EXAMINATION:  GENERAL:critically ill appearing, +resp distress EYES: Pupils equal, round, reactive to light.  No scleral icterus.  MOUTH: Moist mucosal membrane. INTUBATED NECK:  Supple.  PULMONARY: Lungs clear to auscultation, +rhonchi, +wheezing CARDIOVASCULAR: S1 and S2.  Regular rate and rhythm GASTROINTESTINAL: Soft, nontender, -distended. Positive bowel sounds.  MUSCULOSKELETAL: No swelling, clubbing, or edema.  NEUROLOGIC: obtunded,sedated SKIN:normal, warm to touch, Capillary refill delayed  Pulses present bilaterally   Assessment and Plan   68 yo white male with admitted for septic shock and severe pneumonia leading to  aspiration of mucus plugs with severe hypoxic resp failure needing MV support, complicated by metabolic encephalopathy  Severe ACUTE Hypoxic and Hypercapnic Respiratory Failure -continue Mechanical Ventilator support -Wean Fio2 and PEEP as tolerated -VAP/VENT bundle implementation - Wean PEEP & FiO2 as tolerated, maintain SpO2 > 88% - Head of bed elevated 30 degrees, VAP protocol in place - Plateau pressures less than 30 cm H20  - Intermittent chest x-Cinsere & ABG PRN - Ensure adequate pulmonary hygiene  Unable to wean from vent today DO nOT perform wake up assessment, severe hypoxia   Community Acquired Pneumonia vs Post-Obstructive Pneumonia - Trend WBC and monitor fever curve - Continue Azithromycin  and Zosyn  - Narrow ABX pending culture and sensitivities - Strep pneumo urinary antigen negative - Procalcitonin 1.1 - Respiratory PCR (flu/covid/rsv): negative - Blood cultures x 2: no growth to date  NEUROLOGY ACUTE METABOLIC ENCEPHALOPATHY -need for sedation -Goal RASS -2 to -3  INFECTIOUS DISEASE -continue antibiotics as prescribed -follow up cultures Septic Shock secondary to CAP vs. Post-Obstructive Pneumonia  CARDIAC Elevated Troponin suspected d/t Demand Ischemia ~  Atrial Fibrillation  11/22 ECHO: EF 65-70%, mild LVH, mild mitral valve stenosis, mild-moderate aortic stenosis with mild dilatation of aortic root (40mm); IVC dilated with <50% variability suggesting RAP of . - Continuous cardiac monitoring - Vasopressors to maintain MAP goal >65 - Remains on levo at 2mcg - Intermittent use of dopamine ; not currently requiring - CVC placed in right IJ - Continue stress dose steroids - EKG as indicated - Trend lactic; now normalized - Troponin peaked at 44 - Amiodarone  ordered for A Fib, but stopped due to low HR  Bilateral Pleural Effusions R>L Suspected Right Empyema Right Lung Mass 11/22 Chest CTA PE: Confulent areas of heterogeneous hypoenhancement in the  right lung are more consistent with multifocal tumor than consolidation, up to 9.8 cm each. Underlying Emphysema. Bilateral layering pleural effusions have mildly progressed since yesterday, with increased dependent and lower lobe atelectasis. No evidence of pulmonary embolism. Indeterminant T6 and T10 compression fractures are stable. - Full vent support - FiO2 and PEEP to maintain O2 sat of >92% - Maintain plateau pressures less than 30 cm H2O - Ensure pulmonary hygiene - VAP - Duonebs prn - Intermittent CXR and ABG Plan for US  guided THORACENTESIS - Bronchoscopy performed, sending off cultures    SEPTIC shock SOURCE-pneumonia -use vasopressors to keep MAP>65 as needed -follow ABG and LA as needed -follow up cultures -emperic ABX -stress dose steroids -aggressive IV fluid Resuscitation    ENDO - ICU hypoglycemic\Hyperglycemia protocol -check FSBS per protocol   GI GI PROPHYLAXIS as indicated NUTRITIONAL STATUS DIET-->TF's as tolerated Constipation protocol as indicated   ELECTROLYTES -follow labs as needed -replace as needed -pharmacy consultation and following  RESTRICTIVE TRANSFUSION PROTOCOL TRANSFUSION  IF HGB<7  or ACTIVE BLEEDING OR DX of ACUTE CORONARY SYNDROMES       Labs   CBC: Recent Labs  Lab 08/11/24 1129 08/12/24 0341 08/13/24 0445 08/14/24 0417  WBC 16.8* 17.8* 12.9* 17.6*  NEUTROABS 14.6*  --  11.6* 15.1*  HGB 10.1* 8.9* 8.1* 9.1*  HCT  28.0* 25.0* 23.3* 26.2*  MCV 92.4 91.9 94.0 94.6  PLT 114* 86* 61* 82*    Basic Metabolic Panel: Recent Labs  Lab 08/11/24 1129 08/12/24 0341 08/12/24 1559 08/12/24 1732 08/13/24 0445 08/14/24 0417  NA 131* 130*  --   --  128* 128*  K 2.9* 3.0* 3.3* 3.4* 3.5 3.6  CL 87* 90*  --   --  91* 89*  CO2 27 27  --   --  26 26  GLUCOSE 107* 155*  --   --  129* 199*  BUN 59* 49*  --   --  46* 46*  CREATININE 1.66* 1.27*  --   --  1.16 1.42*  CALCIUM  8.9 8.3*  --   --  8.6* 8.4*  MG 2.3 2.2  --    --  2.3 2.1  PHOS 3.6 3.8  --   --  2.9 4.3   GFR: Estimated Creatinine Clearance: 46.5 mL/min (A) (by C-G formula based on SCr of 1.42 mg/dL (H)). Recent Labs  Lab 08/11/24 1129 08/11/24 1205 08/11/24 1330 08/11/24 1653 08/12/24 0341 08/12/24 1559 08/13/24 0445 08/14/24 0202 08/14/24 0417  PROCALCITON  --   --  1.10  --   --   --   --   --   --   WBC 16.8*  --   --   --  17.8*  --  12.9*  --  17.6*  LATICACIDVEN  --  2.5*  --  1.5  --  0.9  --  1.7  --     Liver Function Tests: Recent Labs  Lab 08/11/24 1129 08/12/24 0341 08/13/24 0445  AST 24  --  17  ALT 7  --  5  ALKPHOS 73  --  63  BILITOT 0.9  --  0.6  PROT 6.4*  --  5.5*  ALBUMIN  2.7* 2.1* 2.5*   Recent Labs  Lab 08/11/24 1129  LIPASE 16   No results for input(s): AMMONIA in the last 168 hours.  ABG    Component Value Date/Time   PHART 7.44 08/14/2024 0230   PCO2ART 44 08/14/2024 0230   PO2ART 109 (H) 08/14/2024 0230   HCO3 29.9 (H) 08/14/2024 0230   O2SAT 99.5 08/14/2024 0230     Coagulation Profile: Recent Labs  Lab 08/13/24 1104  INR 1.1    Cardiac Enzymes: No results for input(s): CKTOTAL, CKMB, CKMBINDEX, TROPONINI in the last 168 hours.  HbA1C: Hgb A1c MFr Bld  Date/Time Value Ref Range Status  04/04/2022 04:40 AM 5.7 (H) 4.8 - 5.6 % Final    Comment:    (NOTE) Pre diabetes:          5.7%-6.4%  Diabetes:              >6.4%  Glycemic control for   <7.0% adults with diabetes   12/24/2010 12:00 AM 5.8 4.0 - 6.0 % Final    CBG: Recent Labs  Lab 08/11/24 1544  GLUCAP 112*    DVT/GI PRX  assessed I Assessed the need for Labs I Assessed the need for Foley I Assessed the need for Central Venous Line Family Discussion when available I Assessed the need for Mobilization I made an Assessment of medications to be adjusted accordingly Safety Risk assessment completed  CASE DISCUSSED IN MULTIDISCIPLINARY ROUNDS WITH ICU TEAM     Critical Care Time devoted to  patient care services described in this note is 65 minutes.  Critical care was necessary to treat /prevent imminent and  life-threatening deterioration. Overall, patient is critically ill, prognosis is guarded.  Patient with Multiorgan failure and at high risk for cardiac arrest and death.    Nickolas Alm Cellar, M.D.  [facility]  Medical Director [facility]

## 2024-08-14 NOTE — Progress Notes (Addendum)
 Initial Nutrition Assessment  DOCUMENTATION CODES:   Severe malnutrition in context of chronic illness  INTERVENTION:   Vital 1.2@50ml /hr- Initiate at 59ml/hr and increase by 10ml/hr q 8 hours until goal rate is reached.   ProSource TF 20- Give 60ml daily via tube, each supplement provides 80kcal and 20g of protein.   Free water  flushes 30ml q4 hours to maintain tube patency   Regimen provides 1520kcal/day, 110g/day protein and 1121ml/day of fluid.   Thiamine  100mg  daily via tube x 7 days   Vitamin C  500mg  BID via tube   Pt at high refeed risk; recommend monitor potassium, magnesium  and phosphorus labs daily until stable  Daily weights   NUTRITION DIAGNOSIS:   Severe Malnutrition related to chronic illness as evidenced by moderate fat depletion, severe muscle depletion, 14 percent weight loss in 8 months.  GOAL:   Provide needs based on ASPEN/SCCM guidelines  MONITOR:   Vent status, Labs, Weight trends, TF tolerance, I & O's, Skin  REASON FOR ASSESSMENT:   Ventilator    ASSESSMENT:   68 y/o male with h/o intellectual disability, GERD, dysphagia with suspected stricture, HTN, HLD, bipolar disorder and SIADH who is admitted with weakness, falls, septic shock and CAP.  Pt sedated and ventilated. Family at beside reports pt with good appetite and oral intake at baseline. Pt has been having dysphagia for the past couple of months from suspected esophageal stricture r/t GERD. Family reports pt has been losing weight. Per chart, pt is down ~23lbs(14%) since March; this is significant weight loss. OGT in place. Plan is to initiate tube feeds today. Pt is at high refeed risk. Palliative care is following.   Medications reviewed and include: colace, solu-cortef , insulin , protonix , miralax , KCl, heparin , levophed , zosyn , vasopressin     Labs reviewed: Na 128(L), K 3.6 wnl, BUN 46(H), creat 1.42(H), P 4.3 wnl, Mg 2.1 wnl  Wbc- 17.6(H), Hgb 9.1(L), Hct 26.2(L)  Patient is  currently intubated on ventilator support MV: 7.9 L/min Temp (24hrs), Avg:97 F (36.1 C), Min:95.7 F (35.4 C), Max:98.4 F (36.9 C)  MAP >88mmHg   UOP-   NUTRITION - FOCUSED PHYSICAL EXAM:  Flowsheet Row Most Recent Value  Orbital Region Mild depletion  Upper Arm Region Moderate depletion  Thoracic and Lumbar Region Moderate depletion  Buccal Region Mild depletion  Temple Region Severe depletion  Clavicle Bone Region Severe depletion  Clavicle and Acromion Bone Region Severe depletion  Scapular Bone Region Moderate depletion  Dorsal Hand Unable to assess  Patellar Region Severe depletion  Anterior Thigh Region Moderate depletion  Posterior Calf Region Severe depletion  Edema (RD Assessment) None  Hair Reviewed  Eyes Reviewed  Mouth Reviewed  Skin Reviewed  Nails Reviewed   Diet Order:   Diet Order             Diet NPO time specified  Diet effective now                  EDUCATION NEEDS:   No education needs have been identified at this time  Skin:  Skin Assessment: Reviewed RN Assessment (ecchymosis)  Last BM:  PTA  Height:   Ht Readings from Last 1 Encounters:  08/11/24 5' 8 (1.727 m)    Weight:   Wt Readings from Last 1 Encounters:  08/14/24 65.1 kg    Ideal Body Weight:  70 kg  BMI:  Body mass index is 21.82 kg/m.  Estimated Nutritional Needs:   Kcal:  1561kcal/day  Protein:  100-115g/day  Fluid:  1.7-2.0L/day  Eric Shams MS, RD, LDN If unable to be reached, please send secure chat to RD inpatient available from 8:00a-4:00p daily

## 2024-08-14 NOTE — Progress Notes (Signed)
 PHARMACY - ANTICOAGULATION CONSULT NOTE  Pharmacy Consult for Heparin  Infusion Indication: atrial fibrillation- no bolus, low goal given low PLTs  No Known Allergies  Patient Measurements: Height: 5' 8 (172.7 cm) Weight: 65.1 kg (143 lb 8.3 oz) IBW/kg (Calculated) : 68.4 HEPARIN  DW (KG): 61.2  Vital Signs: Temp: 98.1 F (36.7 C) (11/24 2000) Temp Source: Axillary (11/24 2000) BP: 124/66 (11/24 1900) Pulse Rate: 48 (11/24 2000)  Labs: Recent Labs    08/12/24 0341 08/13/24 0445 08/13/24 1104 08/14/24 0036 08/14/24 0417 08/14/24 1111 08/14/24 1912  HGB 8.9* 8.1*  --   --  9.1*  --   --   HCT 25.0* 23.3*  --   --  26.2*  --   --   PLT 86* 61*  --   --  82*  --   --   APTT  --   --  34  --   --   --   --   LABPROT  --   --  15.3*  --   --   --   --   INR  --   --  1.1  --   --   --   --   HEPARINUNFRC  --   --   --  0.23*  --  0.38 0.34  CREATININE 1.27* 1.16  --   --  1.42*  --   --     Estimated Creatinine Clearance: 46.5 mL/min (A) (by C-G formula based on SCr of 1.42 mg/dL (H)).   Medical History: Past Medical History:  Diagnosis Date   Bipolar disorder (HCC)    Hypertension     Medications:  SQH for DVT ppx- last dose 11/23 @ 0511  Assessment: Patient is a 68 year old male with a past medical history of bipolar disorder, smoking history and recent history of dysphagia and GERD and choking on food who presented with septic shock likely 2/2 pneumonia vs post-obstructive pneumonia. Pharmacy has been consulted to initiate patient on a heparin  infusion for Afib.   Baseline INR 1.1 and aPTT 34. Hgb 8.1. PLT 61  Goal of Therapy:  Heparin  level 0.3-0.5 units/ml Monitor platelets by anticoagulation protocol: Yes   Plan:  Per discussion with PA, will do no bolus, low goal given low PLTs  11/24:  HL @ 1111 = 0.38, therapeutic x1 11/24:  HL @ 1912 = 0.34, therapeutic x2   - Will continue patient on current rate and recheck HL daily with AM labs given x2  consecutive therapeutic levels.  - Monitor CBC daily - watch PLTS and signs/symptoms of bleeding closely   Annabella LOISE Banks, PharmD Clinical Pharmacist 08/14/2024 8:11 PM

## 2024-08-14 NOTE — Progress Notes (Signed)
 PHARMACY CONSULT NOTE  Pharmacy Consult for Electrolyte Monitoring and Replacement   Recent Labs: Potassium (mmol/L)  Date Value  08/14/2024 3.6   Magnesium  (mg/dL)  Date Value  88/75/7974 2.1   Calcium  (mg/dL)  Date Value  88/75/7974 8.4 (L)   Albumin  (g/dL)  Date Value  88/76/7974 2.5 (L)  11/30/2023 3.8 (L)   Phosphorus (mg/dL)  Date Value  88/75/7974 4.3   Sodium (mmol/L)  Date Value  08/14/2024 128 (L)  11/30/2023 132 (L)   Assessment: 68 y.o. male w/ PMH of bipolar d/o, HTN, GERD, dysphagia, h/o intestinal disaccharide deficiency  who presents to the ED for evaluation of an abnormal lab and hypotension. Pharmacy is asked to follow and replace electrolytes while in CCU   Goal of Therapy:  Electrolytes WNL  Plan:  --Na 128, Cl 89. Continue to monitor --Give KCL 40mEq x1 per tube. Target phos ~ 4 with respiratory failure --Re-check electrolytes tomorrow AM  Eric Hatfield 08/14/2024 9:19 AM

## 2024-08-14 NOTE — Progress Notes (Addendum)
 2040 - Levophed  restarted and titrated per order to keep MAP >65  2100 - Axillary temp. 95.7, warm blankets applied to patient.   2145 - RT notified oxygen dropping to 86-88% and on 100% FiO2.   2150 - RT at bedside PEEP increased to 8  2230 - NP notified Levophed  nrestarted and at 28 mcg/min  2232 - Attempted to place an OG tube, pt biting, coughing, and gagging; Propofol  increased to 15 mcg/kg/min  2238 - NP notified HR sustaining in the 40's  2239 - Amiodarone  turned off and discontinued  2300 - Barehugger applied to patient for axillary temp 96.0  0000 - Axillary temp improving 96.2  0137 - Heparin  gtt rate/dose changed to 9 ml/hr  0158 - NP notified Levophed  at 38 mcg/min  0222 - Vasopressin  started to keep MAP >65   0330 - NP at bedside  0330- OG Tube placed at 70cm; Abd xray ordered entered.  64 - NP notified HR sustaining in the 40's; Propofol  decreased to 10 mcg/min   0350 - NP notified HR sustaining in the low 40's; Propofol  decreased to 5 mcg/min   0409 - Fentanyl  started and titrated per order.   0430 - OG placement confirmed by xray.

## 2024-08-14 NOTE — Progress Notes (Signed)
 Daily Progress Note   Patient Name: Eric Hatfield.       Date: 08/14/2024 DOB: [birth year]  Age: 68 y.o. MRN#: [id number] Attending Physician: Eric Scrivener, MD Primary Care Physician: Eric Nancyann BRAVO, MD Admit Date: 08/11/2024  Reason for Consultation/Follow-up: Establishing goals of care  Patient Profile/HPI:  68 y.o. male  with past medical history of bipolar disorder and current cigarette smoker admitted from home on 08/11/2024 with generalized weakness.  Reportedly from family Eric Hatfield experiencing symptoms of GERD and dysphagia for several days.  Eric Hatfield developed shortness of breath and weakness which led to ED evaluation.   Found to be in shock likely secondary to community-acquired pneumonia, admitted to ICU for vasopressor support.   CT chest obtained revealing extensive right lower lobe and right middle lobe consolidations, small pleural effusion, thickened esophagus and masslike consolidation in the right middle lobe  Intubated on 11/23.   Palliative medicine was consulted for assisting with goals of care conversations.  Subjective: Chart reviewed including labs, progress notes, imaging from this and previous encounters.  Pt had bronchoscopy with alveolar lavage yesterday. Thick secretions noted.  Today he is hypotensive requiring vasopressin  and norepinephrine .  Cr up today- 1.42. WBC up today 17.6.  I met with patient's spouse, patient's mother and father, patient's daughter, two granddaughters, his brother, and niece.  Patient's spouse has observable cognitive deficits. This was confirmed by discussion with her granddaughter prior to my meeting. However, family wishes to support spouse in her role of surrogate decision maker.  Prior to admission patient was  living at home with spouse. 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 He enjoys watching the [employer]. In his younger years he enjoyed skiing.  Family has noted significant and ongoing decline. He has lost approx 20 pounds in the last year. He has fallen twice in the last few months. He is mostly bed/chair bound and sleeps more than he is awake.  His brother shared that he does not believe he had quality of life he would choose for himself prior to this admission.  We discussed his current critical acute illness- his sepsis related to pneumonia and possible tumor in lung. We discussed this in the setting of what sounds like profound deconditioned stated prior to admission. Reviewed that he is currently on  two forms of life support- ventilator and vasopressors.  Advanced care planning and goals of care was discussed.  Discussion was prolonged due to spouse's cognitive deficits, but family was very supportive in aiding her understanding.  Patient would not want to be kept alive artificially for a prolonged amount of time. We discussed hoping for the best and allowing time for outcomes- however, also the fact that patient is critically ill and the need to prepare for if his condition worsened or he failed to improve. Comfort measures and allowing natural death was touched on gently.  We discussed code status. Encouraged patient/family to consider DNR status understanding evidenced based poor outcomes in similar hospitalized patients, as the cause of the arrest is likely associated with chronic/terminal disease rather than a reversible acute cardio-pulmonary event.  After extended discussion- spouse and all family members agreed that DNR status was appropriate for patient and agreed to placement of DNR order.   Review of Systems  Unable to perform ROS: Intubated     Physical Exam Vitals and nursing note reviewed.  Constitutional:      Appearance: He is ill-appearing.      Comments: frail  Cardiovascular:     Rate and Rhythm: Normal rate.  Pulmonary:     Comments: intubated Skin:    Coloration: Skin is pale.  Neurological:     Comments: sedated             Vital Signs: BP 134/67   Pulse (!) 41   Temp 97.8 F (36.6 C) (Axillary)   Resp 18   Ht 5' 8 (1.727 m)   Wt 65.1 kg   SpO2 94%   BMI 21.82 kg/m  SpO2: SpO2: 94 % O2 Device: O2 Device: Ventilator O2 Flow Rate: O2 Flow Rate (L/min): 55 L/min  Intake/output summary:  Intake/Output Summary (Last 24 hours) at 08/14/2024 1532 Last data filed at 08/14/2024 1445 Gross per 24 hour  Intake 2114.9 ml  Output 795 ml  Net 1319.9 ml   LBM: Last BM Date :  (PTA) Baseline Weight: Weight: 62.7 kg Most recent weight: Weight: 65.1 kg       Palliative Assessment/Data: PPS: 10%      Patient Active Problem List   Diagnosis Date Noted   Acute respiratory failure with hypoxia (HCC) 08/13/2024   Septic shock (HCC) 08/13/2024   Community acquired pneumonia 08/11/2024   Bipolar disorder, curr episode mixed, severe, with psychotic features (HCC) 04/24/2022   Bipolar affective disorder (HCC) 04/23/2022   Systolic murmur 06/30/2019   SIADH (syndrome of inappropriate ADH production) 06/28/2019   Primary hypertension 12/11/2016   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 09/02/2015   Hyponatremia 09/02/2015   Allergic rhinitis 09/02/2015   Smoking greater than 30 pack years 09/02/2015    Palliative Care Assessment & Plan    Assessment/Recommendations/Plan  Sepsis, respirator failure d/t pneumonia and possible lung cancer in setting of significant deconditioning prior to onset- continue current interventions- would not want extended amount of time on ventilator if patient was not going to improve, no trach, no PEG DNR- pre- arrest interventions desired Recommend discussions to be had with patient spouse and other family members for any major decisions due to spouse's  cognitive deficites PMT will continue to follow and provide support   Code Status:   Code Status: Do not attempt resuscitation (DNR) PRE-ARREST INTERVENTIONS DESIRED   Prognosis:  Unable to determine  Discharge Planning: To Be Determined  Care plan was discussed with patient's family, attending MD,  and bedside RN.   Thank you for allowing the Palliative Medicine Team to assist in the care of this patient.  Total time: 120 minutes Prolonged billing:  Time includes:   Preparing to see the patient (e.g., review of tests) Obtaining and/or reviewing separately obtained history Performing a medically necessary appropriate examination and/or evaluation Counseling and educating the patient/family/caregiver Ordering medications, tests, or procedures Referring and communicating with other health care professionals (when not reported separately) Documenting clinical information in the electronic or other health record Independently interpreting results (not reported separately) and communicating results to the patient/family/caregiver Care coordination (not reported separately) Clinical documentation  Cassondra Stain, AGNP-C Palliative Medicine   Please contact Palliative Medicine Team phone at [phone] for questions and concerns.

## 2024-08-14 NOTE — CV Procedure (Deleted)
 PROCEDURE SUMMARY:  Successful image-guided right-sided diagnostic and therapeutic thoracentesis. Yielded 0.40 liters of hazy, straw-colored pleural fluid. Patient tolerated procedure well. EBL: Zero No immediate complications.  Specimen was sent for labs. Post procedure CXR is pending.  Please see imaging section of Epic for full dictation.  Carlin LABOR Eulis Salazar PA-C 08/14/2024 4:09 PM

## 2024-08-14 NOTE — Plan of Care (Signed)
 Patient is intubated and sedated; Patient hypothermic at start of shift, warm blankets applied with minimal improvement so Barehugger applied with no difficulty. Pt is currently on Levophed  at 24 mcg/kg/min, Vasopressin  at 0.03 units/min, Fentanyl  at 25 mcg/hr, Heparin  at 9 ml/hr Levophed  and vasopressin  titrated to keep MAP >65. Patient has been having bloody tinged sputum throughout this shift, continue to monitor for bleeding due to platelets 86 this morning and heparin  infusing. Foley catheter UOP 425 ml; Oral care provided with repositioning. Wife updated at the start of th shift and number is on white board and in the chart.  Problem: Education: Goal: Knowledge of General Education information will improve Description: Including pain rating scale, medication(s)/side effects and non-pharmacologic comfort measures Outcome: Progressing   Problem: Pain Managment: Goal: General experience of comfort will improve and/or be controlled Outcome: Progressing   Problem: Safety: Goal: Ability to remain free from injury will improve Outcome: Progressing   Problem: Clinical Measurements: Goal: Ability to maintain clinical measurements within normal limits will improve Outcome: Not Progressing Goal: Will remain free from infection Outcome: Not Progressing Goal: Diagnostic test results will improve Outcome: Not Progressing Goal: Respiratory complications will improve Outcome: Not Progressing Goal: Cardiovascular complication will be avoided Outcome: Not Progressing   Problem: Nutrition: Goal: Adequate nutrition will be maintained Outcome: Not Progressing   Problem: Coping: Goal: Level of anxiety will decrease Outcome: Not Progressing

## 2024-08-14 NOTE — Progress Notes (Signed)
 PHARMACY - ANTICOAGULATION CONSULT NOTE  Pharmacy Consult for Heparin  Infusion Indication: atrial fibrillation- no bolus, low goal given low PLTs  No Known Allergies  Patient Measurements: Height: 5' 8 (172.7 cm) Weight: 65.1 kg (143 lb 8.3 oz) IBW/kg (Calculated) : 68.4 HEPARIN  DW (KG): 61.2  Vital Signs: Temp: 97.8 F (36.6 C) (11/24 1122) Temp Source: Axillary (11/24 1122) BP: 118/64 (11/24 1300) Pulse Rate: 39 (11/24 1300)  Labs: Recent Labs    08/12/24 0341 08/13/24 0445 08/13/24 1104 08/14/24 0036 08/14/24 0417 08/14/24 1111  HGB 8.9* 8.1*  --   --  9.1*  --   HCT 25.0* 23.3*  --   --  26.2*  --   PLT 86* 61*  --   --  82*  --   APTT  --   --  34  --   --   --   LABPROT  --   --  15.3*  --   --   --   INR  --   --  1.1  --   --   --   HEPARINUNFRC  --   --   --  0.23*  --  0.38  CREATININE 1.27* 1.16  --   --  1.42*  --     Estimated Creatinine Clearance: 46.5 mL/min (A) (by C-G formula based on SCr of 1.42 mg/dL (H)).   Medical History: Past Medical History:  Diagnosis Date   Bipolar disorder (HCC)    Hypertension     Medications:  SQH for DVT ppx- last dose 11/23 @ 0511  Assessment: Patient is a 68 year old male with a past medical history of bipolar disorder, smoking history and recent history of dysphagia and GERD and choking on food who presented with septic shock likely 2/2 pneumonia vs post-obstructive pneumonia. Pharmacy has been consulted to initiate patient on a heparin  infusion for Afib.   Baseline INR 1.1 and aPTT 34. Hgb 8.1. PLT 61  Goal of Therapy:  Heparin  level 0.3-0.5 units/ml Monitor platelets by anticoagulation protocol: Yes   Plan:  Per discussion with PA, will do no bolus, low goal given low PLTs  11/24:  HL @ 1111 = 0.38, therapeutic x1 - Will continue pt on current rate and recheck HL 6 hrs after rate change.   Monitor CBC daily - watch PLTS and signs/symptoms of bleeding closely   Towanda Hornstein A Westlyn Glaza,  PharmD 08/14/2024,1:04 PM

## 2024-08-14 NOTE — Progress Notes (Signed)
 PHARMACY - ANTICOAGULATION CONSULT NOTE  Pharmacy Consult for Heparin  Infusion Indication: atrial fibrillation- no bolus, low goal given low PLTs  No Known Allergies  Patient Measurements: Height: 5' 8 (172.7 cm) Weight: 65.1 kg (143 lb 8.3 oz) IBW/kg (Calculated) : 68.4 HEPARIN  DW (KG): 61.2  Vital Signs: Temp: 97.1 F (36.2 C) (11/24 0100) Temp Source: Axillary (11/24 0100) BP: 108/64 (11/24 0100) Pulse Rate: 56 (11/24 0100)  Labs: Recent Labs    08/11/24 1129 08/12/24 0341 08/13/24 0445 08/13/24 1104 08/14/24 0036  HGB 10.1* 8.9* 8.1*  --   --   HCT 28.0* 25.0* 23.3*  --   --   PLT 114* 86* 61*  --   --   APTT  --   --   --  34  --   LABPROT  --   --   --  15.3*  --   INR  --   --   --  1.1  --   HEPARINUNFRC  --   --   --   --  0.23*  CREATININE 1.66* 1.27* 1.16  --   --     Estimated Creatinine Clearance: 56.9 mL/min (by C-G formula based on SCr of 1.16 mg/dL).   Medical History: Past Medical History:  Diagnosis Date   Bipolar disorder (HCC)    Hypertension     Medications:  SQH for DVT ppx- last dose 11/23 @ 0511  Assessment: Patient is a 68 year old male with a past medical history of bipolar disorder, smoking history and recent history of dysphagia and GERD and choking on food who presented with septic shock likely 2/2 pneumonia vs post-obstructive pneumonia. Pharmacy has been consulted to initiate patient on a heparin  infusion for Afib.   Baseline INR 1.1 and aPTT 34. Hgb 8.1. PLT 61  Goal of Therapy:  Heparin  level 03-0.5 units/ml Monitor platelets by anticoagulation protocol: Yes   Plan:  Per discussion with PA, will do no bolus, low goal given low PLTs  11/24:  HL @ 0036 = 0.23, SUBtherapeutic - Will continue pt on current rate and recheck HL 6 hrs after rate change.   Monitor CBC daily - watch PLTS and signs/symptoms of bleeding closely   Alishia Lebo D, PharmD 08/14/2024,1:21 AM

## 2024-08-15 ENCOUNTER — Inpatient Hospital Stay

## 2024-08-15 DIAGNOSIS — A419 Sepsis, unspecified organism: Secondary | ICD-10-CM | POA: Diagnosis not present

## 2024-08-15 DIAGNOSIS — Z515 Encounter for palliative care: Secondary | ICD-10-CM | POA: Diagnosis not present

## 2024-08-15 DIAGNOSIS — R918 Other nonspecific abnormal finding of lung field: Secondary | ICD-10-CM | POA: Diagnosis not present

## 2024-08-15 DIAGNOSIS — J9601 Acute respiratory failure with hypoxia: Secondary | ICD-10-CM | POA: Diagnosis not present

## 2024-08-15 LAB — CBC
HCT: 22 % — ABNORMAL LOW (ref 39.0–52.0)
HCT: 21 % — ABNORMAL LOW (ref 39.0–52.0)
Hemoglobin: 7.5 g/dL — ABNORMAL LOW (ref 13.0–17.0)
Hemoglobin: 7.1 g/dL — ABNORMAL LOW (ref 13.0–17.0)
MCH: 32.8 pg (ref 26.0–34.0)
MCH: 33 pg (ref 26.0–34.0)
MCHC: 34.1 g/dL (ref 30.0–36.0)
MCHC: 33.8 g/dL (ref 30.0–36.0)
MCV: 96.1 fL (ref 80.0–100.0)
MCV: 97.7 fL (ref 80.0–100.0)
Platelets: 63 K/uL — ABNORMAL LOW (ref 150–400)
Platelets: 61 K/uL — ABNORMAL LOW (ref 150–400)
RBC: 2.29 MIL/uL — ABNORMAL LOW (ref 4.22–5.81)
RBC: 2.15 MIL/uL — ABNORMAL LOW (ref 4.22–5.81)
RDW: 16.7 % — ABNORMAL HIGH (ref 11.5–15.5)
RDW: 16.7 % — ABNORMAL HIGH (ref 11.5–15.5)
WBC: 14.5 K/uL — ABNORMAL HIGH (ref 4.0–10.5)
WBC: 14.2 K/uL — ABNORMAL HIGH (ref 4.0–10.5)
nRBC: 0.2 % (ref 0.0–0.2)
nRBC: 0.2 % (ref 0.0–0.2)

## 2024-08-15 LAB — RENAL FUNCTION PANEL
Albumin: 2.4 g/dL — ABNORMAL LOW (ref 3.5–5.0)
Anion gap: 11 (ref 5–15)
BUN: 53 mg/dL — ABNORMAL HIGH (ref 8–23)
CO2: 27 mmol/L (ref 22–32)
Calcium: 8.2 mg/dL — ABNORMAL LOW (ref 8.9–10.3)
Chloride: 93 mmol/L — ABNORMAL LOW (ref 98–111)
Creatinine, Ser: 1.45 mg/dL — ABNORMAL HIGH (ref 0.61–1.24)
GFR, Estimated: 53 mL/min — ABNORMAL LOW (ref >60)
Glucose, Bld: 146 mg/dL — ABNORMAL HIGH (ref 70–99)
Phosphorus: 3.6 mg/dL (ref 2.5–4.6)
Potassium: 3.9 mmol/L (ref 3.5–5.1)
Sodium: 131 mmol/L — ABNORMAL LOW (ref 135–145)

## 2024-08-15 LAB — BLOOD GAS, ARTERIAL
Acid-Base Excess: 2.5 mmol/L — ABNORMAL HIGH (ref 0.0–2.0)
Bicarbonate: 28.4 mmol/L — ABNORMAL HIGH (ref 20.0–28.0)
FIO2: 80 %
MECHVT: 420 mL
Mechanical Rate: 16
O2 Saturation: 99.2 %
PEEP: 8 cmH2O
Patient temperature: 37
pCO2 arterial: 48 mmHg (ref 32–48)
pH, Arterial: 7.38 (ref 7.35–7.45)
pO2, Arterial: 107 mmHg (ref 83–108)

## 2024-08-15 LAB — GLUCOSE, CAPILLARY
Glucose-Capillary: 134 mg/dL — ABNORMAL HIGH (ref 70–99)
Glucose-Capillary: 132 mg/dL — ABNORMAL HIGH (ref 70–99)
Glucose-Capillary: 152 mg/dL — ABNORMAL HIGH (ref 70–99)
Glucose-Capillary: 155 mg/dL — ABNORMAL HIGH (ref 70–99)
Glucose-Capillary: 146 mg/dL — ABNORMAL HIGH (ref 70–99)
Glucose-Capillary: 157 mg/dL — ABNORMAL HIGH (ref 70–99)

## 2024-08-15 LAB — HEPARIN LEVEL (UNFRACTIONATED): Heparin Unfractionated: 0.33 [IU]/mL (ref 0.30–0.70)

## 2024-08-15 LAB — CYTOLOGY - NON PAP

## 2024-08-15 LAB — MAGNESIUM: Magnesium: 2.1 mg/dL (ref 1.7–2.4)

## 2024-08-15 LAB — LEGIONELLA PNEUMOPHILA SEROGP 1 UR AG: L. pneumophila Serogp 1 Ur Ag: NEGATIVE

## 2024-08-15 LAB — HEMOGLOBIN AND HEMATOCRIT, BLOOD
HCT: 21.3 % — ABNORMAL LOW (ref 39.0–52.0)
Hemoglobin: 7.1 g/dL — ABNORMAL LOW (ref 13.0–17.0)

## 2024-08-15 MED ORDER — PROPOFOL 1000 MG/100ML IV EMUL
INTRAVENOUS | Status: AC
  Administered 2024-08-15: 10 ug
  Filled 2024-08-15: qty 100

## 2024-08-15 MED ORDER — VECURONIUM BROMIDE 10 MG IV SOLR
10.0000 mg | INTRAVENOUS | Status: DC | PRN
  Administered 2024-08-15: 10 mg via INTRAVENOUS
  Filled 2024-08-15: qty 10

## 2024-08-15 MED ORDER — RISPERIDONE 1 MG PO TABS: 1.0000 mg | ORAL_TABLET | Freq: Every day | ORAL | Status: DC

## 2024-08-15 MED ORDER — PROPOFOL 1000 MG/100ML IV EMUL
0.0000 ug/kg/min | INTRAVENOUS | Status: DC
  Administered 2024-08-15 – 2024-08-16 (×2): 10 ug/kg/min via INTRAVENOUS
  Filled 2024-08-15: qty 100

## 2024-08-15 MED ORDER — POTASSIUM CHLORIDE 20 MEQ PO PACK
40.0000 meq | PACK | Freq: Once | ORAL | Status: AC
  Administered 2024-08-15: 40 meq
  Filled 2024-08-15: qty 2

## 2024-08-15 NOTE — Progress Notes (Signed)
 PHARMACY - ANTICOAGULATION CONSULT NOTE  Pharmacy Consult for Heparin  Infusion Indication: atrial fibrillation- no bolus, low goal given low PLTs  No Known Allergies  Patient Measurements: Height: 5' 8 (172.7 cm) Weight: 67.4 kg (148 lb 9.4 oz) IBW/kg (Calculated) : 68.4 HEPARIN  DW (KG): 61.2  Vital Signs: Temp: 97.8 F (36.6 C) (11/25 0400) Temp Source: Axillary (11/25 0400) BP: 118/68 (11/25 0400) Pulse Rate: 50 (11/25 0430)  Labs: Recent Labs    08/13/24 0445 08/13/24 1104 08/14/24 0036 08/14/24 0417 08/14/24 1111 08/14/24 1912 08/15/24 0401  HGB 8.1*  --   --  9.1*  --   --  7.5*  HCT 23.3*  --   --  26.2*  --   --  22.0*  PLT 61*  --   --  82*  --   --  63*  APTT  --  34  --   --   --   --   --   LABPROT  --  15.3*  --   --   --   --   --   INR  --  1.1  --   --   --   --   --   HEPARINUNFRC  --   --    < >  --  0.38 0.34 0.33  CREATININE 1.16  --   --  1.42*  --   --  1.45*   < > = values in this interval not displayed.    Estimated Creatinine Clearance: 47.1 mL/min (A) (by C-G formula based on SCr of 1.45 mg/dL (H)).   Medical History: Past Medical History:  Diagnosis Date   Bipolar disorder (HCC)    Hypertension     Medications:  SQH for DVT ppx- last dose 11/23 @ 0511  Assessment: Patient is a 68 year old male with a past medical history of bipolar disorder, smoking history and recent history of dysphagia and GERD and choking on food who presented with septic shock likely 2/2 pneumonia vs post-obstructive pneumonia. Pharmacy has been consulted to initiate patient on a heparin  infusion for Afib.   Baseline INR 1.1 and aPTT 34. Hgb 8.1. PLT 61  Goal of Therapy:  Heparin  level 0.3-0.5 units/ml Monitor platelets by anticoagulation protocol: Yes   Plan:  Per discussion with PA, will do no bolus, low goal given low PLTs  11/24:  HL @ 1111 = 0.38, therapeutic x1 11/24:  HL @ 1912 = 0.34, therapeutic x2 11/25 0401 HL 0.33, therapeutic x 3   -  Will continue patient on current rate and recheck HL daily with AM labs  - Monitor CBC daily - watch PLTS and signs/symptoms of bleeding closely   Rankin CANDIE Dills, PharmD, [facility] 08/15/2024 6:04 AM

## 2024-08-15 NOTE — Plan of Care (Signed)

## 2024-08-15 NOTE — Progress Notes (Signed)
 Daily Progress Note   Patient Name: Eric Hatfield.       Date: 08/15/2024 DOB: [birth year]  Age: 68 y.o. MRN#: [id number] Attending Physician: Eric Scrivener, MD Primary Care Physician: Eric Nancyann BRAVO, MD Admit Date: 08/11/2024  Reason for Consultation/Follow-up: Establishing goals of care  Patient Profile/HPI:  68 y.o. male  with past medical history of bipolar disorder and current cigarette smoker admitted from home on 08/11/2024 with generalized weakness.  Reportedly from family Eric Hatfield experiencing symptoms of GERD and dysphagia for several days.  Eric Hatfield developed shortness of breath and weakness which led to ED evaluation.   Found to be in shock likely secondary to community-acquired pneumonia, admitted to ICU for vasopressor support.   CT chest obtained revealing extensive right lower lobe and right middle lobe consolidations, small pleural effusion, thickened esophagus and masslike consolidation in the right middle lobe   Intubated on 11/23.   Palliative medicine was consulted for assisting with goals of care conversations.  Subjective: Chart reviewed including labs, progress notes, imaging from this and previous encounters. Labs reviewed- WBC down today 14.5. Cr up 1.45. Cytology from thoracentesis is pending.  Weaning has not been attempted. Continues on norepinephrine  and vasopressin . Sedated. No wake up assessment.  Patient's niece Eric Hatfield was at bedside.  Discussed with attending provider Dr. Isaiah during rounds- he would like another meeting with family.  I called patient's spouse and mother.   Review of Systems  Unable to perform ROS: Mental status change     Physical Exam Vitals and nursing note reviewed.  Cardiovascular:     Rate and Rhythm: Normal  rate.  Pulmonary:     Comments: intubated            Vital Signs: BP 107/62   Pulse (!) 42   Temp 97.8 F (36.6 C) (Axillary)   Resp 15   Ht 5' 8 (1.727 m)   Wt 67.4 kg   SpO2 91%   BMI 22.59 kg/m  SpO2: SpO2: 91 % O2 Device: O2 Device: Ventilator O2 Flow Rate: O2 Flow Rate (L/min): 55 L/min  Intake/output summary:  Intake/Output Summary (Last 24 hours) at 08/15/2024 1311 Last data filed at 08/15/2024 1200 Gross per 24 hour  Intake 1661.65 ml  Output 605 ml  Net 1056.65 ml   LBM: Last BM  Date :  (PTA) Baseline Weight: Weight: 62.7 kg Most recent weight: Weight: 67.4 kg       Palliative Assessment/Data: PPS: 10%      Patient Active Problem List   Diagnosis Date Noted   Acute respiratory failure with hypoxia (HCC) 08/13/2024   Septic shock (HCC) 08/13/2024   Community acquired pneumonia 08/11/2024   Bipolar disorder, curr episode mixed, severe, with psychotic features (HCC) 04/24/2022   Bipolar affective disorder (HCC) 04/23/2022   Systolic murmur 06/30/2019   SIADH (syndrome of inappropriate ADH production) 06/28/2019   Primary hypertension 12/11/2016   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 Isaiah at 2pm tomorrow   Code Status:   Code Status: Do not attempt resuscitation (DNR) PRE-ARREST INTERVENTIONS DESIRED   Prognosis:  Unable to determine  Discharge Planning: To Be Determined  Care plan was discussed with patient's family and attending MD.   Thank you for allowing the Palliative Medicine Team to assist in the care of this patient.  Total time:  60  minutes Prolonged billing:  Time includes:   Preparing to see the patient (e.g., review of tests) Obtaining and/or reviewing separately obtained history Performing a medically necessary appropriate examination and/or evaluation Counseling and educating the patient/family/caregiver Ordering medications, tests, or procedures Referring and communicating with other health care professionals (when not reported separately) Documenting clinical information in the electronic or other health record Independently interpreting results (not reported separately) and communicating results to the patient/family/caregiver Care coordination (not reported separately) Clinical documentation  Cassondra Stain, AGNP-C Palliative Medicine   Please contact Palliative Medicine Team phone at [phone] for questions and concerns.

## 2024-08-15 NOTE — Progress Notes (Signed)
 PHARMACY CONSULT NOTE  Pharmacy Consult for Electrolyte Monitoring and Replacement   Recent Labs: Potassium (mmol/L)  Date Value  08/15/2024 3.9   Magnesium  (mg/dL)  Date Value  88/74/7974 2.1   Calcium  (mg/dL)  Date Value  88/74/7974 8.2 (L)   Albumin  (g/dL)  Date Value  88/74/7974 2.4 (L)  11/30/2023 3.8 (L)   Phosphorus (mg/dL)  Date Value  88/74/7974 3.6   Sodium (mmol/L)  Date Value  08/15/2024 131 (L)  11/30/2023 132 (L)   Assessment: 68 y.o. male w/ PMH of bipolar d/o, HTN, GERD, dysphagia, h/o intestinal disaccharide deficiency  who presents to the ED for evaluation of an abnormal lab and hypotension. Pharmacy is asked to follow and replace electrolytes while in CCU   Goal of Therapy:  Electrolytes WNL  Plan:  --Na 131, Cl 93. Continue to monitor --Give KCL 40mEq x1 per tube. Target phos ~ 4 with respiratory failure --Re-check electrolytes tomorrow AM  Hamdan Toscano A Madelyn Tlatelpa 08/15/2024 7:41 AM

## 2024-08-15 NOTE — Plan of Care (Addendum)
 Patient's wedding ring removed and given to wife.   Problem: Clinical Measurements: Goal: Ability to maintain clinical measurements within normal limits will improve Outcome: Progressing Goal: Respiratory complications will improve Outcome: Progressing   Problem: Nutrition: Goal: Adequate nutrition will be maintained Outcome: Progressing   Problem: Pain Managment: Goal: General experience of comfort will improve and/or be controlled Outcome: Progressing

## 2024-08-15 NOTE — Progress Notes (Signed)
 NAME:  Eric Hatfield., MRN:  [id number], DOB:  Mar 22, 1956, LOS: 4 ADMISSION DATE:  08/11/2024, CONSULTATION DATE:  08/11/24 REFERRING MD:  Ester Sharps, MD   CHIEF COMPLAINT:  shock, resp failure  History of Present Illness:  Case of 68 year old male patient with a past medical history of bipolar disorder, smoking history and recent history of dysphagia and GERD and choking on food presenting to [facility] on 11/21 due to generalized weakness hypotension and shortness of breath.  He was found to be in shock likely secondary to community-acquired pneumonia requiring ICU admission for norepinephrine .   Per wife who is at bedside who also has intellectual disability, [name] has been having weakness and lethargy for the past week.  She emphasizes that he has been choking on food and having GERD for the past month.  She does report some weight loss but could not tell me how much.   Per chart review he had a fall about 4 days ago and cannot get around the house and was recommended to go to the emergency department.   In the ED, initially on room air however post fluid resuscitation for hypotension he was placed on 3 L nasal cannula.   Labs with elevated white count at 16.8 with left shift 87% neutrophils.  Creatinine of 1.66 mg/dL from a baseline of 0.9.  Hypokalemia with potassium of 2.9.  Lactic acidosis with a lactate of 2.5.   Chest x-Keiondre with extensive patchy opacification of the right lower lobe.   Bedside ultrasound of the right hemithorax without any signs of empyema but did show extensive consolidation.   CT chest without contrast with extensive right lower lobe and right middle lobe consolidations.  Small pleural effusion.  Thickened esophagus.  And masslike consolidation in the right middle lobe.   Of note he is a smoker and was enrolled in the lung cancer screening program and had a CT chest in 2023 with a 7.8 mm right middle lobe nodule.  Pertinent Medical History   Hypertension Bipolar Disorder  Significant Hospital Events: Including procedures, antibiotic start and stop dates in addition to other pertinent events   11/21: Admitted to the ICU for levophed  support 11/22: Remains critically ill requiring HHFNC and now bipap for hypoxia. Increasing pressor requirements.  11/23: Remains critically ill with increased O2 requirements overnight on Bipap. Levophed  at . Intubated at bedside and arterial line placed. 11/24 thoracentesis-exudative process, severe hypoxia 11/25 remains on vent severe hypoixa  Interim History / Subjective:  Remains critically ill Remains intubated Severe hypoxia Requires VENT support for survival Findings are concerning for lung cancer Palliative care team consulted, now DNR status  Objective    Blood pressure (!) 119/58, pulse (!) 41, temperature 97.8 F (36.6 C), temperature source Axillary, resp. rate 16, height 5' 8 (1.727 m), weight 67.4 kg, SpO2 93%.    Vent Mode: PRVC FiO2 (%):  [80 %-100 %] 80 % Set Rate:  [16 bmp] 16 bmp Vt Set:  [420 mL] 420 mL PEEP:  [8 cmH20] 8 cmH20 Plateau Pressure:  [15 cmH20-44 cmH20] 15 cmH20   Intake/Output Summary (Last 24 hours) at 08/15/2024 0735 Last data filed at 08/15/2024 0519 Gross per 24 hour  Intake 1395.03 ml  Output 640 ml  Net 755.03 ml   Filed Weights   08/13/24 0500 08/14/24 0500 08/15/24 0422  Weight: 65.1 kg 65.1 kg 67.4 kg      REVIEW OF SYSTEMS  PATIENT IS UNABLE TO PROVIDE COMPLETE REVIEW OF SYSTEMS  DUE TO SEVERE CRITICAL ILLNESS   PHYSICAL EXAMINATION:  GENERAL:critically ill appearing, +resp distress EYES: Pupils equal, round, reactive to light.  No scleral icterus.  MOUTH: Moist mucosal membrane. INTUBATED NECK: Supple.  PULMONARY: Lungs clear to auscultation, +rhonchi, +wheezing CARDIOVASCULAR: S1 and S2.  Regular rate and rhythm GASTROINTESTINAL: Soft, nontender, -distended. Positive bowel sounds.  MUSCULOSKELETAL: No swelling,  clubbing, or edema.  NEUROLOGIC: obtunded,sedated SKIN:normal, warm to touch, Capillary refill delayed  Pulses present bilaterally    Assessment and Plan   68 yo white male with admitted for septic shock and severe pneumonia leading to aspiration of mucus plugs with severe hypoxic resp failure needing MV support, complicated by metabolic encephalopathy  Severe ACUTE Hypoxic and Hypercapnic Respiratory Failure -continue Mechanical Ventilator support -Wean Fio2 and PEEP as tolerated -VAP/VENT bundle implementation - Wean PEEP & FiO2 as tolerated, maintain SpO2 > 88% - Head of bed elevated 30 degrees, VAP protocol in place - Plateau pressures less than 30 cm H20  - Intermittent chest x-Cylus & ABG PRN - Ensure adequate pulmonary hygiene  DO nOT perform wake up assessment, severe hypoxia Vent Mode: PRVC FiO2 (%):  [80 %-100 %] 80 % Set Rate:  [16 bmp] 16 bmp Vt Set:  [420 mL] 420 mL PEEP:  [8 cmH20] 8 cmH20 Plateau Pressure:  [15 cmH20-44 cmH20] 15 cmH20   Community Acquired Pneumonia vs Post-Obstructive Pneumonia - Trend WBC and monitor fever curve - Continue Azithromycin  and Zosyn  - Narrow ABX pending culture and sensitivities - Strep pneumo urinary antigen negative - Procalcitonin 1.1 - Respiratory PCR (flu/covid/rsv): negative - Blood cultures x 2: no growth to date   NEUROLOGY ACUTE METABOLIC ENCEPHALOPATHY -need for sedation -Goal RASS -2 to -3  INFECTIOUS DISEASE -continue antibiotics as prescribed -follow up cultures Septic Shock secondary to CAP vs. Post-Obstructive Pneumonia  CARDIAC Elevated Troponin suspected d/t Demand Ischemia ~  Atrial Fibrillation  11/22 ECHO: EF 65-70%, mild LVH, mild mitral valve stenosis, mild-moderate aortic stenosis with mild dilatation of aortic root (40mm); IVC dilated with <50% variability suggesting RAP of . - Continuous cardiac monitoring - Vasopressors to maintain MAP goal >65 - Remains on levo at 2mcg - Continue  stress dose steroids - EKG as indicated - Trend lactic; now normalized - Troponin peaked at 44 - Amiodarone  ordered for A Fib, but stopped due to low HR  Bilateral Pleural Effusions R>L Exudative process, not infectious Right Lung Mass 11/22 Chest CTA PE: Confulent areas of heterogeneous hypoenhancement in the right lung are more consistent with multifocal tumor than consolidation, up to 9.8 cm each. Underlying Emphysema. Bilateral layering pleural effusions have mildly progressed since yesterday, with increased dependent and lower lobe atelectasis. No evidence of pulmonary embolism. Indeterminant T6 and T10 compression fractures are stable. - Full vent support - FiO2 and PEEP to maintain O2 sat of >92% - Maintain plateau pressures less than 30 cm H2O - Ensure pulmonary hygiene - VAP bundle - Duonebs prn - Intermittent CXR and ABG    SEPTIC shock SOURCE-pneumonia -use vasopressors to keep MAP>65 as needed -follow ABG and LA as needed -follow up cultures -emperic ABX - stress dose steroids -aggressive IV fluid Resuscitation    ENDO - ICU hypoglycemic\Hyperglycemia protocol -check FSBS per protocol   GI GI PROPHYLAXIS as indicated NUTRITIONAL STATUS DIET-->TF's as tolerated Constipation protocol as indicated   ELECTROLYTES -follow labs as needed -replace as needed -pharmacy consultation and following  RESTRICTIVE TRANSFUSION PROTOCOL TRANSFUSION  IF HGB<7  or ACTIVE BLEEDING OR DX of ACUTE  CORONARY SYNDROMES         Labs   CBC: Recent Labs  Lab 08/11/24 1129 08/12/24 0341 08/13/24 0445 08/14/24 0417 08/15/24 0401  WBC 16.8* 17.8* 12.9* 17.6* 14.5*  NEUTROABS 14.6*  --  11.6* 15.1*  --   HGB 10.1* 8.9* 8.1* 9.1* 7.5*  HCT 28.0* 25.0* 23.3* 26.2* 22.0*  MCV 92.4 91.9 94.0 94.6 96.1  PLT 114* 86* 61* 82* 63*    Basic Metabolic Panel: Recent Labs  Lab 08/11/24 1129 08/12/24 0341 08/12/24 1559 08/12/24 1732 08/13/24 0445 08/14/24 0417  08/15/24 0401  NA 131* 130*  --   --  128* 128* 131*  K 2.9* 3.0* 3.3* 3.4* 3.5 3.6 3.9  CL 87* 90*  --   --  91* 89* 93*  CO2 27 27  --   --  26 26 27   GLUCOSE 107* 155*  --   --  129* 199* 146*  BUN 59* 49*  --   --  46* 46* 53*  CREATININE 1.66* 1.27*  --   --  1.16 1.42* 1.45*  CALCIUM  8.9 8.3*  --   --  8.6* 8.4* 8.2*  MG 2.3 2.2  --   --  2.3 2.1 2.1  PHOS 3.6 3.8  --   --  2.9 4.3 3.6   GFR: Estimated Creatinine Clearance: 47.1 mL/min (A) (by C-G formula based on SCr of 1.45 mg/dL (H)). Recent Labs  Lab 08/11/24 1205 08/11/24 1330 08/11/24 1653 08/12/24 0341 08/12/24 1559 08/13/24 0445 08/14/24 0202 08/14/24 0417 08/15/24 0401  PROCALCITON  --  1.10  --   --   --   --   --   --   --   WBC  --   --   --  17.8*  --  12.9*  --  17.6* 14.5*  LATICACIDVEN 2.5*  --  1.5  --  0.9  --  1.7  --   --     Liver Function Tests: Recent Labs  Lab 08/11/24 1129 08/12/24 0341 08/13/24 0445 08/15/24 0401  AST 24  --  17  --   ALT 7  --  5  --   ALKPHOS 73  --  63  --   BILITOT 0.9  --  0.6  --   PROT 6.4*  --  5.5*  --   ALBUMIN  2.7* 2.1* 2.5* 2.4*   Recent Labs  Lab 08/11/24 1129  LIPASE 16   No results for input(s): AMMONIA in the last 168 hours.  ABG    Component Value Date/Time   PHART 7.38 08/15/2024 0500   PCO2ART 48 08/15/2024 0500   PO2ART 107 08/15/2024 0500   HCO3 28.4 (H) 08/15/2024 0500   O2SAT 99.2 08/15/2024 0500     Coagulation Profile: Recent Labs  Lab 08/13/24 1104  INR 1.1    Cardiac Enzymes: No results for input(s): CKTOTAL, CKMB, CKMBINDEX, TROPONINI in the last 168 hours.  HbA1C: Hgb A1c MFr Bld  Date/Time Value Ref Range Status  08/14/2024 04:16 AM 6.5 (H) 4.8 - 5.6 % Final    Comment:    (NOTE)         Prediabetes: 5.7 - 6.4         Diabetes: >6.4         Glycemic control for adults with diabetes: <7.0   04/04/2022 04:40 AM 5.7 (H) 4.8 - 5.6 % Final    Comment:    (NOTE) Pre diabetes:  5.7%-6.4%  Diabetes:              >6.4%  Glycemic control for   <7.0% adults with diabetes     CBG: Recent Labs  Lab 08/14/24 0758 08/14/24 1612 08/14/24 1946 08/14/24 2321 08/15/24 0342  GLUCAP 186* 153* 127* 111* 134*     DVT/GI PRX  assessed I Assessed the need for Labs I Assessed the need for Foley I Assessed the need for Central Venous Line Family Discussion when available I Assessed the need for Mobilization I made an Assessment of medications to be adjusted accordingly Safety Risk assessment completed  CASE DISCUSSED IN MULTIDISCIPLINARY ROUNDS WITH ICU TEAM  FINDINGS ARE CONCERNING FOR LUNG CANCER WITH POST OBSTRUCTIVE PNEUMONIA  Critical Care Time devoted to patient care services described in this note is 65 minutes.  Critical care was necessary to treat /prevent imminent and life-threatening deterioration. Overall, patient is critically ill, prognosis is guarded.  Patient with Multiorgan failure and at high risk for cardiac arrest and death.    Nickolas Alm Cellar, M.D.  [facility]  Medical Director [facility]

## 2024-08-16 DIAGNOSIS — Z515 Encounter for palliative care: Secondary | ICD-10-CM

## 2024-08-16 DIAGNOSIS — A419 Sepsis, unspecified organism: Secondary | ICD-10-CM | POA: Diagnosis not present

## 2024-08-16 DIAGNOSIS — R918 Other nonspecific abnormal finding of lung field: Secondary | ICD-10-CM

## 2024-08-16 DIAGNOSIS — J9601 Acute respiratory failure with hypoxia: Secondary | ICD-10-CM | POA: Diagnosis not present

## 2024-08-16 DIAGNOSIS — J189 Pneumonia, unspecified organism: Secondary | ICD-10-CM | POA: Diagnosis not present

## 2024-08-16 LAB — TYPE AND SCREEN: ABO/RH(D): O POS

## 2024-08-16 LAB — MAGNESIUM: Magnesium: 2.1 mg/dL (ref 1.7–2.4)

## 2024-08-16 LAB — CULTURE, BLOOD (ROUTINE X 2)
Culture: NO GROWTH
Culture: NO GROWTH

## 2024-08-16 LAB — GLUCOSE, CAPILLARY
Glucose-Capillary: 171 mg/dL — ABNORMAL HIGH (ref 70–99)
Glucose-Capillary: 211 mg/dL — ABNORMAL HIGH (ref 70–99)
Glucose-Capillary: 191 mg/dL — ABNORMAL HIGH (ref 70–99)
Glucose-Capillary: 156 mg/dL — ABNORMAL HIGH (ref 70–99)

## 2024-08-16 LAB — CBC
HCT: 21.7 % — ABNORMAL LOW (ref 39.0–52.0)
Hemoglobin: 7.1 g/dL — ABNORMAL LOW (ref 13.0–17.0)
MCH: 32.3 pg (ref 26.0–34.0)
MCHC: 32.7 g/dL (ref 30.0–36.0)
MCV: 98.6 fL (ref 80.0–100.0)
Platelets: 75 K/uL — ABNORMAL LOW (ref 150–400)
RBC: 2.2 MIL/uL — ABNORMAL LOW (ref 4.22–5.81)
RDW: 17 % — ABNORMAL HIGH (ref 11.5–15.5)
WBC: 17.3 K/uL — ABNORMAL HIGH (ref 4.0–10.5)
nRBC: 0.2 % (ref 0.0–0.2)

## 2024-08-16 LAB — RENAL FUNCTION PANEL
Albumin: 2.7 g/dL — ABNORMAL LOW (ref 3.5–5.0)
Anion gap: 8 (ref 5–15)
BUN: 54 mg/dL — ABNORMAL HIGH (ref 8–23)
CO2: 30 mmol/L (ref 22–32)
Calcium: 8.5 mg/dL — ABNORMAL LOW (ref 8.9–10.3)
Chloride: 96 mmol/L — ABNORMAL LOW (ref 98–111)
Creatinine, Ser: 1.01 mg/dL (ref 0.61–1.24)
GFR, Estimated: >60 mL/min (ref >60)
Glucose, Bld: 178 mg/dL — ABNORMAL HIGH (ref 70–99)
Phosphorus: 2.3 mg/dL — ABNORMAL LOW (ref 2.5–4.6)
Potassium: 4.2 mmol/L (ref 3.5–5.1)
Sodium: 134 mmol/L — ABNORMAL LOW (ref 135–145)

## 2024-08-16 LAB — HEPARIN LEVEL (UNFRACTIONATED)
Heparin Unfractionated: 0.22 [IU]/mL — ABNORMAL LOW (ref 0.30–0.70)
Heparin Unfractionated: 0.24 [IU]/mL — ABNORMAL LOW (ref 0.30–0.70)

## 2024-08-16 LAB — TRIGLYCERIDES: Triglycerides: 87 mg/dL (ref <150)

## 2024-08-16 MED ORDER — GLYCOPYRROLATE 0.2 MG/ML IJ SOLN: 0.2000 mg | INTRAMUSCULAR | Status: DC | PRN

## 2024-08-16 MED ORDER — GLYCOPYRROLATE 1 MG PO TABS: 1.0000 mg | ORAL_TABLET | ORAL | Status: DC | PRN

## 2024-08-16 MED ORDER — LACTATED RINGERS IV BOLUS
500.0000 mL | Freq: Once | INTRAVENOUS | Status: AC
Start: 2024-08-16 — End: 2024-08-16
  Administered 2024-08-16: 500 mL via INTRAVENOUS

## 2024-08-16 MED ORDER — LORAZEPAM 2 MG/ML IJ SOLN: 2.0000 mg | INTRAMUSCULAR | Status: DC | PRN

## 2024-08-16 MED ORDER — FENTANYL BOLUS VIA INFUSION: 75.0000 ug | INTRAVENOUS | Status: DC | PRN

## 2024-08-16 MED ORDER — FENTANYL BOLUS VIA INFUSION: 50.0000 ug | INTRAVENOUS | Status: DC | PRN

## 2024-08-16 MED ORDER — K PHOS MONO-SOD PHOS DI & MONO 155-852-130 MG PO TABS
500.0000 mg | ORAL_TABLET | ORAL | Status: AC
  Administered 2024-08-16 (×2): 500 mg
  Filled 2024-08-16 (×2): qty 2

## 2024-08-18 LAB — CULTURE, RESPIRATORY W GRAM STAIN: Gram Stain: NONE SEEN

## 2024-08-21 NOTE — Progress Notes (Signed)
 PHARMACY - ANTICOAGULATION CONSULT NOTE  Pharmacy Consult for Heparin  Infusion Indication: atrial fibrillation- no bolus, low goal given low PLTs  No Known Allergies  Patient Measurements: Height: 5' 8 (172.7 cm) Weight: 67.9 kg (149 lb 11.1 oz) IBW/kg (Calculated) : 68.4 HEPARIN  DW (KG): 61.2  Vital Signs: Temp: 97.9 F (36.6 C) August 23, 2024 1200) Temp Source: Axillary 08/23/2024 0400) BP: 129/71 23-Aug-2024 1400) Pulse Rate: 57 Aug 23, 2024 1400)  Labs: Recent Labs    08/14/24 0417 08/14/24 1111 08/15/24 0401 08/15/24 1541 08/15/24 2153 08-23-24 0406 08/23/24 1330  HGB 9.1*  --  7.5* 7.1* 7.1* 7.1*  --   HCT 26.2*  --  22.0* 21.0* 21.3* 21.7*  --   PLT 82*  --  63* 61*  --  75*  --   HEPARINUNFRC  --    < > 0.33  --   --  0.22* 0.24*  CREATININE 1.42*  --  1.45*  --   --  1.01  --    < > = values in this interval not displayed.    Estimated Creatinine Clearance: 68.2 mL/min (by C-G formula based on SCr of 1.01 mg/dL).   Medical History: Past Medical History:  Diagnosis Date   Bipolar disorder (HCC)    Hypertension     Medications:  SQH for DVT ppx- last dose 11/23 @ 0511  Assessment: Patient is a 67 year old male with a past medical history of bipolar disorder, smoking history and recent history of dysphagia and GERD and choking on food who presented with septic shock likely 2/2 pneumonia vs post-obstructive pneumonia. Pharmacy has been consulted to initiate patient on a heparin  infusion for Afib.   Baseline INR 1.1 and aPTT 34. Hgb 8.1. PLT 61  Goal of Therapy:  Heparin  level 0.3-0.5 units/ml Monitor platelets by anticoagulation protocol: Yes   Plan:  Per discussion with PA, will do no bolus, low goal given low PLTs  11/24 1111 HL = 0.38, therapeutic x1 11/24 1912 HL = 0.34, therapeutic x2 11/25 0401 HL 0.33, therapeutic x 3 2024/08/23 0406 HL 0.22, subtherapeutic   - Increase heparin  infusion to 1100 units/hr - Recheck HL in 6 hrs after rate change  - Monitor CBC  daily - watch PLTS and signs/symptoms of bleeding closely   Iwalani Templeton A Johnthomas Lader, PharmD Clinical Pharmacist Aug 23, 2024 2:58 PM

## 2024-08-21 NOTE — Plan of Care (Signed)
  Problem: Health Behavior/Discharge Planning: Goal: Ability to manage health-related needs will improve Outcome: Progressing   Problem: Clinical Measurements: Goal: Ability to maintain clinical measurements within normal limits will improve Outcome: Progressing Goal: Diagnostic test results will improve Outcome: Progressing Goal: Respiratory complications will improve Outcome: Progressing   Problem: Nutrition: Goal: Adequate nutrition will be maintained Outcome: Progressing   Problem: Pain Managment: Goal: General experience of comfort will improve and/or be controlled Outcome: Progressing

## 2024-08-21 NOTE — Progress Notes (Signed)
 NAME:  Eric Michon., MRN:  [id number], DOB:  1955/12/30, LOS: 5 ADMISSION DATE:  08/11/2024, CONSULTATION DATE:  08/11/24 REFERRING MD:  Ester Sharps, MD   CHIEF COMPLAINT:  shock, resp failure  History of Present Illness:  68 year old male patient with a past medical history of bipolar disorder, smoking history and recent history of dysphagia and GERD and choking on food presenting to [facility] on 11/21 due to generalized weakness hypotension and shortness of breath.  He was found to be in shock likely secondary to community-acquired pneumonia requiring ICU admission for norepinephrine .    Labs with elevated white count at 16.8 with left shift 87% neutrophils.  Creatinine of 1.66 mg/dL from a baseline of 0.9.  Hypokalemia with potassium of 2.9.  Lactic acidosis with a lactate of 2.5.   Chest x-Shashwat with extensive patchy opacification of the right lower lobe.    CT chest without contrast with extensive right lower lobe and right middle lobe consolidations.  Small pleural effusion.  Thickened esophagus.  And masslike consolidation in the right middle lobe.   Of note he is a smoker and was enrolled in the lung cancer screening program and had a CT chest in 2023 with a 7.8 mm right middle lobe nodule.  Pertinent Medical History  Hypertension Bipolar Disorder  Significant Hospital Events: Including procedures, antibiotic start and stop dates in addition to other pertinent events   11/21: Admitted to the ICU for levophed  support 11/22: Remains critically ill requiring HHFNC and now bipap for hypoxia. Increasing pressor requirements.  11/23: Remains critically ill with increased O2 requirements overnight on Bipap. Levophed  at . Intubated at bedside and arterial line placed. 11/24 thoracentesis-exudative process, severe hypoxia 11/25 remains on vent severe hypoxia 08/17/2024 severe hypoxia  Interim History / Subjective:   Remains critically ill Severe hypoxia  Plan for survival  Findings  are concerning for extensive lung cancer  Cognitive consulted now DNR status    Objective    Blood pressure 99/62, pulse 80, temperature 98.2 F (36.8 C), temperature source Axillary, resp. rate 15, height 5' 8 (1.727 m), weight 67.9 kg, SpO2 96%.    Vent Mode: PRVC FiO2 (%):  [80 %-100 %] 90 % Set Rate:  [16 bmp] 16 bmp Vt Set:  [420 mL] 420 mL PEEP:  [8 cmH20-10 cmH20] 10 cmH20 Plateau Pressure:  [13 cmH20-18 cmH20] 17 cmH20   Intake/Output Summary (Last 24 hours) at 17-Aug-2024 0723 Last data filed at 2024/08/17 9351 Gross per 24 hour  Intake 2571.9 ml  Output 1295 ml  Net 1276.9 ml   Filed Weights   08/14/24 0500 08/15/24 0422 2024-08-17 0500  Weight: 65.1 kg 67.4 kg 67.9 kg     REVIEW OF SYSTEMS  PATIENT IS UNABLE TO PROVIDE COMPLETE REVIEW OF SYSTEMS DUE TO SEVERE CRITICAL ILLNESS   PHYSICAL EXAMINATION:  GENERAL:critically ill appearing, +resp distress EYES: Pupils equal, round, reactive to light.  No scleral icterus.  MOUTH: Moist mucosal membrane. INTUBATED NECK: Supple.  PULMONARY: Lungs clear to auscultation, +rhonchi, +wheezing CARDIOVASCULAR: S1 and S2.  Regular rate and rhythm GASTROINTESTINAL: Soft, nontender, -distended. Positive bowel sounds.  MUSCULOSKELETAL: No swelling, clubbing, or edema.  NEUROLOGIC: obtunded,sedated SKIN:normal, warm to touch, Capillary refill delayed  Pulses present bilaterally   Assessment and Plan   68 yo white male with admitted for septic shock and severe pneumonia leading to aspiration of mucus plugs with severe hypoxic resp failure needing MV support, complicated by metabolic encephalopathy Findings are concerning for extensive lung cancer,  postobstructive pneumonia   Severe ACUTE Hypoxic and Hypercapnic Respiratory Failure -continue Mechanical Ventilator support -Wean Fio2 and PEEP as tolerated -VAP/VENT bundle implementation - Wean PEEP & FiO2 as tolerated, maintain SpO2 > 88% - Head of bed elevated 30 degrees,  VAP protocol in place - Plateau pressures less than 30 cm H20  - Intermittent chest x-Amiri & ABG PRN - Ensure adequate pulmonary hygiene  DO nOT perform wake up assessment, severe hypoxia Vent Mode: PRVC FiO2 (%):  [80 %-100 %] 90 % Set Rate:  [16 bmp] 16 bmp Vt Set:  [420 mL] 420 mL PEEP:  [8 cmH20-10 cmH20] 10 cmH20 Plateau Pressure:  [13 cmH20-18 cmH20] 17 cmH20    Post-Obstructive Pneumonia - Continue Azithromycin  and Zosyn  - Narrow ABX pending culture and sensitivities - Strep pneumo urinary antigen negative - Procalcitonin 1.1 - Respiratory PCR (flu/covid/rsv): negative - Blood cultures x 2: no growth to date   NEUROLOGY ACUTE METABOLIC ENCEPHALOPATHY -need for sedation -Goal RASS -2 to -3   INFECTIOUS DISEASE -continue antibiotics as prescribed -follow up cultures Post-Obstructive Pneumonia  CARDIAC Elevated Troponin suspected d/t Demand Ischemia ~  Atrial Fibrillation  11/22 ECHO: EF 65-70%, mild LVH, mild mitral valve stenosis, mild-moderate aortic stenosis with mild dilatation of aortic root (40mm); IVC dilated with <50% variability suggesting RAP of . - Continuous cardiac monitoring - Vasopressors to maintain MAP goal >65 - Remains on levo at 2mcg - Continue stress dose steroids - EKG as indicated - Trend lactic; now normalized - Troponin peaked at 44 - Amiodarone  ordered for A Fib, but stopped due to low HR  Bilateral Pleural Effusions R>L Exudative process, not infectious Right Lung Mass 11/22 Chest CTA PE: Confulent areas of heterogeneous hypoenhancement in the right lung are more consistent with multifocal tumor than consolidation, up to 9.8 cm each. Underlying Emphysema. Bilateral layering pleural effusions have mildly progressed since yesterday, with increased dependent and lower lobe atelectasis. No evidence of pulmonary embolism. Indeterminant T6 and T10 compression fractures are stable. Cytology negative for malignancy right-sided  thoracentesis pleural effusion    SEPTIC shock SOURCE-pneumonia -use vasopressors to keep MAP>65 as needed    ENDO - ICU hypoglycemic\Hyperglycemia protocol -check FSBS per protocol   GI GI PROPHYLAXIS as indicated NUTRITIONAL STATUS DIET-->TF's as tolerated Constipation protocol as indicated   ELECTROLYTES -follow labs as needed -replace as needed -pharmacy consultation and following  RESTRICTIVE TRANSFUSION PROTOCOL TRANSFUSION  IF HGB<7  or ACTIVE BLEEDING OR DX of ACUTE CORONARY SYNDROMES           Labs   CBC: Recent Labs  Lab 08/11/24 1129 08/12/24 0341 08/13/24 0445 08/14/24 0417 08/15/24 0401 08/15/24 1541 08/15/24 2153 09-07-2024 0406  WBC 16.8*   < > 12.9* 17.6* 14.5* 14.2*  --  17.3*  NEUTROABS 14.6*  --  11.6* 15.1*  --   --   --   --   HGB 10.1*   < > 8.1* 9.1* 7.5* 7.1* 7.1* 7.1*  HCT 28.0*   < > 23.3* 26.2* 22.0* 21.0* 21.3* 21.7*  MCV 92.4   < > 94.0 94.6 96.1 97.7  --  98.6  PLT 114*   < > 61* 82* 63* 61*  --  75*   < > = values in this interval not displayed.    Basic Metabolic Panel: Recent Labs  Lab 08/12/24 0341 08/12/24 1559 08/12/24 1732 08/13/24 0445 08/14/24 0417 08/15/24 0401 September 07, 2024 0406  NA 130*  --   --  128* 128* 131* 134*  K  3.0*   < > 3.4* 3.5 3.6 3.9 4.2  CL 90*  --   --  91* 89* 93* 96*  CO2 27  --   --  26 26 27 30   GLUCOSE 155*  --   --  129* 199* 146* 178*  BUN 49*  --   --  46* 46* 53* 54*  CREATININE 1.27*  --   --  1.16 1.42* 1.45* 1.01  CALCIUM  8.3*  --   --  8.6* 8.4* 8.2* 8.5*  MG 2.2  --   --  2.3 2.1 2.1 2.1  PHOS 3.8  --   --  2.9 4.3 3.6 2.3*   < > = values in this interval not displayed.   GFR: Estimated Creatinine Clearance: 68.2 mL/min (by C-G formula based on SCr of 1.01 mg/dL). Recent Labs  Lab 08/11/24 1205 08/11/24 1330 08/11/24 1653 08/12/24 0341 08/12/24 1559 08/13/24 0445 08/14/24 0202 08/14/24 0417 08/15/24 0401 08/15/24 1541 Aug 21, 2024 0406  PROCALCITON  --  1.10  --    --   --   --   --   --   --   --   --   WBC  --   --   --    < >  --    < >  --  17.6* 14.5* 14.2* 17.3*  LATICACIDVEN 2.5*  --  1.5  --  0.9  --  1.7  --   --   --   --    < > = values in this interval not displayed.    Liver Function Tests: Recent Labs  Lab 08/11/24 1129 08/12/24 0341 08/13/24 0445 08/15/24 0401 2024/08/21 0406  AST 24  --  17  --   --   ALT 7  --  5  --   --   ALKPHOS 73  --  63  --   --   BILITOT 0.9  --  0.6  --   --   PROT 6.4*  --  5.5*  --   --   ALBUMIN  2.7* 2.1* 2.5* 2.4* 2.7*   Recent Labs  Lab 08/11/24 1129  LIPASE 16   No results for input(s): AMMONIA in the last 168 hours.  ABG    Component Value Date/Time   PHART 7.38 08/15/2024 0500   PCO2ART 48 08/15/2024 0500   PO2ART 107 08/15/2024 0500   HCO3 28.4 (H) 08/15/2024 0500   O2SAT 99.2 08/15/2024 0500     Coagulation Profile: Recent Labs  Lab 08/13/24 1104  INR 1.1    Cardiac Enzymes: No results for input(s): CKTOTAL, CKMB, CKMBINDEX, TROPONINI in the last 168 hours.  HbA1C: Hgb A1c MFr Bld  Date/Time Value Ref Range Status  08/14/2024 04:16 AM 6.5 (H) 4.8 - 5.6 % Final    Comment:    (NOTE)         Prediabetes: 5.7 - 6.4         Diabetes: >6.4         Glycemic control for adults with diabetes: <7.0   04/04/2022 04:40 AM 5.7 (H) 4.8 - 5.6 % Final    Comment:    (NOTE) Pre diabetes:          5.7%-6.4%  Diabetes:              >6.4%  Glycemic control for   <7.0% adults with diabetes     CBG: Recent Labs  Lab 08/15/24 1133 08/15/24 1540 08/15/24 1953 08/15/24 2316 August 21, 2024  0317  GLUCAP 152* 155* 146* 157* 171*     FINDINGS ARE CONCERNING FOR LUNG CANCER WITH POST OBSTRUCTIVE PNEUMONIA    DVT/GI PRX  assessed I Assessed the need for Labs I Assessed the need for Foley I Assessed the need for Central Venous Line Family Discussion when available I Assessed the need for Mobilization I made an Assessment of medications to be adjusted  accordingly Safety Risk assessment completed  CASE DISCUSSED IN MULTIDISCIPLINARY ROUNDS WITH ICU TEAM     Critical Care Time devoted to patient care services described in this note is 55 minutes.  Critical care was necessary to treat /prevent imminent and life-threatening deterioration. Overall, patient is critically ill, prognosis is guarded.  Patient with Multiorgan failure and at high risk for cardiac arrest and death.    Nickolas Alm Cellar, M.D.  [facility]  Medical Director [facility]

## 2024-08-21 NOTE — Death Summary Note (Addendum)
 DEATH SUMMARY   Patient Details  Name: Eric Hatfield. MRN: [id number] DOB: [birth year]  Admission/Discharge Information   Admit Date:  08-23-24  Date of Death: Date of Death: August 28, 2024  Time of Death: Time of Death: 09/07/10  Length of Stay: 5  Referring Physician: Gasper Nancyann BRAVO, MD   Reason(s) for Hospitalization  Admitted for pneumonia  Diagnoses  Preliminary cause of death: pneumonia, COPD, lung mass Secondary Diagnoses (including complications and co-morbidities):  Principal Problem:   Community acquired pneumonia Active Problems:   Acute respiratory failure with hypoxia (HCC)   Septic shock (HCC)   Lung mass   Palliative care by specialist   Brief Hospital Course (including significant findings, care, treatment, and services provided and events leading to death)    CHIEF COMPLAINT:  shock, resp failure   History of Present Illness:  68 year old male patient with a past medical history of bipolar disorder, smoking history and recent history of dysphagia and GERD and choking on food presenting to [facility] on 08/23/2024 due to generalized weakness hypotension and shortness of breath.  He was found to be in shock likely secondary to community-acquired pneumonia requiring ICU admission for norepinephrine .    Labs with elevated white count at 16.8 with left shift 87% neutrophils.  Creatinine of 1.66 mg/dL from a baseline of 0.9.  Hypokalemia with potassium of 2.9.  Lactic acidosis with a lactate of 2.5.   Chest x-Dillyn with extensive patchy opacification of the right lower lobe.    CT chest without contrast with extensive right lower lobe and right middle lobe consolidations.  Small pleural effusion.  Thickened esophagus.  And masslike consolidation in the right middle lobe.   Of note he is a smoker and was enrolled in the lung cancer screening program and had a CT chest in 09/07/2022 with a 7.8 mm right middle lobe nodule.   Pertinent Medical History  Hypertension Bipolar  Disorder   Significant Hospital Events: Including procedures, antibiotic start and stop dates in addition to other pertinent events   08-23-2024: Admitted to the ICU for levophed  support 11/22: Remains critically ill requiring HHFNC and now bipap for hypoxia. Increasing pressor requirements.  11/23: Remains critically ill with increased O2 requirements overnight on Bipap. Levophed  at . Intubated at bedside and arterial line placed. 11/24 thoracentesis-exudative process, severe hypoxia 11/25 remains on vent severe hypoxia Aug 28, 2024 severe hypoxia     GOALS OF CARE DISCUSSION  The Clinical status was relayed to family in detail-  Updated and notified of patients medical condition- Patient remains unresponsive and will not open eyes to command.   Patient with increased WOB and using accessory muscles to breathe Explained to family course of therapy and the modalities  Patient with Progressive multiorgan failure with a very high probablity of a very minimal chance of meaningful recovery despite all aggressive and optimal medical therapy.    Family understands the situation.  They have consented and agreed to DNR/DNI and would like to proceed with Comfort care measures.  Family are satisfied with Plan of action and management. All questions answered     Pertinent Labs and Studies  Significant Diagnostic Studies DG Chest Port 1 View Result Date: 08/15/2024 EXAM: 1 VIEW(S) XRAY OF THE CHEST 08/15/2024 02:43:17 PM COMPARISON: 1 day prior. CLINICAL HISTORY: Shortness of breath. FINDINGS: LINES, TUBES AND DEVICES: Endotracheal tube terminates 5.0 cm above carina. Right sided PICC line terminates over the low right atrium. Nasogastric tube extends beyond the inferior aspect of the film. LUNGS AND  PLEURA: Small right pleural effusion is similar. Moderate interstitial edema is not significantly changed. Right lower lobe dense consolidation. No pneumothorax. HEART AND MEDIASTINUM: No acute  abnormality of the cardiac and mediastinal silhouettes. BONES AND SOFT TISSUES: No acute osseous abnormality. IMPRESSION: 1. No significant change since 1 day prior. 2. Congestive heart failure with small right pleural effusion and right lower lobe dense consolidation, favoring concurrent pneumonia or aspiration. Electronically signed by: Rockey Kilts MD 08/15/2024 03:51 PM EST RP Workstation: [id number]   DG Chest Port 1 View Result Date: 08/14/2024 CLINICAL DATA:  [id number] S/P thoracentesis [id number] Pleural effusion on right [id number] EXAM: PORTABLE CHEST 1 VIEW COMPARISON:  IR ultrasound, earlier same day. Chest XR, 08/13/2024. CT chest, 08/12/2024. FINDINGS: Support lines; ETT with tip 2 cm from carina. RIGHT IJ CVC with catheter tip within the RIGHT atrium. Enteric decompression tube, with tip extending outside field of view. Overlying cutaneous leads. The RIGHT cardiac border and apex are obscured. Minimally improved aeration of the RIGHT chest with relatively similar degree of residual pleural effusion. No pneumothorax. Similar appearance of a relatively clear LEFT chest. BILATERAL perihilar and interstitial thickening. RIGHT mid lung and bibasilar opacities. No interval osseous abnormality. IMPRESSION: 1. Intubation with ETT tip 2 cm from carina. Consider retraction by at least 1 cm for appropriate placement. 2. Minimally improved aeration of the RIGHT chest, with similar degree of residual pleural effusion post thoracentesis. No pneumothorax. 3. Additional pulmonary findings and lines/tubes, as above. Electronically Signed   By: Thom Hall M.D.   On: 08/14/2024 16:43   US  THORACENTESIS ASP PLEURAL SPACE W/IMG GUIDE Result Date: 08/14/2024 INDICATION: [id number] Pleural effusion 1660 68 year old male admitted to ICU with septic shock secondary to CAP. Recent imaging notable for bilateral small pleural effusions IR was requested for diagnostic and therapeutic thoracentesis. EXAM: ULTRASOUND GUIDED  RIGHT-SIDED DIAGNOSTIC AND THERAPEUTIC THORACENTESIS MEDICATIONS: 4 mL of 1% lidocaine . COMPLICATIONS: None immediate. PROCEDURE: An ultrasound guided thoracentesis was thoroughly discussed with the patient and questions answered. The benefits, risks, alternatives and complications were also discussed. The patient understands and wishes to proceed with the procedure. Written consent was obtained. Ultrasound was performed to localize and mark an adequate pocket of fluid in the RIGHT chest. The area was then prepped and draped in the normal sterile fashion. 1% Lidocaine  was used for local anesthesia. Under ultrasound guidance a 6 Fr Safe-T-Centesis catheter was introduced. Thoracentesis was performed. The catheter was removed and a dressing applied. FINDINGS: A total of approximately 400 mL of hazy, straw-colored pleural fluid was removed. Samples were sent to the laboratory as requested by the clinical team. IMPRESSION: Successful ultrasound guided RIGHT thoracentesis yielding 400 mL of pleural fluid. Performed by: Carlin Griffon, PA-C under supervision of Thom Hall, MD Electronically Signed   By: Thom Hall M.D.   On: 08/14/2024 16:37   DG Abd 1 View Result Date: 08/14/2024 EXAM: 1 VIEW XRAY OF THE ABDOMEN 08/14/2024 04:03:00 AM COMPARISON: Portable chest x Kenard 08/13/2024. CLINICAL HISTORY: 68 year old Encounter for orogastric (OG) tube placement. FINDINGS: LINES, TUBES AND DEVICES: Enteric tube in place with tip and side port projecting over the stomach. Right central venous catheter in place with tip projecting just below the expected cavoatrial junction position. BOWEL: Nonobstructive bowel gas pattern. SOFT TISSUES: No opaque urinary calculi. BONES: No acute osseous abnormality. LUNGS AND PLEURA: Partially visualized right basilar opacity and right pleural effusion. IMPRESSION: 1. Satisfactory Enteric tube placement into the stomach. 2. Ongoing confluent, mass-like right lung base opacity.  See details on  recent chest CTA. 3. Right central venous catheter with tip just below the expected cavoatrial junction position. Electronically signed by: Helayne Hurst MD 08/14/2024 04:59 AM EST RP Workstation: [id number]   DG Chest Port 1 View Result Date: 08/13/2024 CLINICAL DATA:  Intubation. EXAM: PORTABLE CHEST 1 VIEW COMPARISON:  Chest radiograph dated 08/13/2024 FINDINGS: Endotracheal tube approximately 5 cm above the carina. Right IJ central venous line with tip over the the right atrium. Small bilateral pleural effusions. An area of consolidative change in the right mid lung field as well as diffuse left lung interstitial coarsening and nodularity. No pneumothorax. Stable cardiac silhouette. No acute osseous pathology. IMPRESSION: 1. Endotracheal tube above the carina. 2. Small bilateral pleural effusions. 3. Right mid lung field consolidative change and diffuse left lung interstitial coarsening and nodularity. Electronically Signed   By: Vanetta Chou M.D.   On: 08/13/2024 16:26   DG Chest Port 1 View Result Date: 08/13/2024 EXAM: 1 VIEW(S) XRAY OF THE CHEST 08/13/2024 07:50:28 AM COMPARISON: CTA chest 08/12/2024. CLINICAL HISTORY: 68 year old male with acute respiratory failure. FINDINGS: LUNGS AND PLEURA: Ongoing confluent and masslike opacity in the right lower lobe, stable. Asymmetric reticulonodular opacity in the left lung does appear mildly progressed. Lung volumes are not significantly changed. Otherwise, ventilation is stable. No pleural effusion. No pneumothorax. HEART AND MEDIASTINUM: No acute abnormality of the cardiac and mediastinal silhouettes. BONES AND SOFT TISSUES: No acute osseous abnormality. IMPRESSION: 1. Mildly progressed asymmetric reticulonodular opacity in the left lung, nonspecific and could be acute infection or asymmetric edema. 2. Otherwise stable with abnormal mass-like opacity throughout the right lower lung - see details on CTA chest yesterday. Electronically signed by: Helayne Hurst MD 08/13/2024 07:55 AM EST RP Workstation: [id number]   ECHOCARDIOGRAM COMPLETE Result Date: 08/12/2024    ECHOCARDIOGRAM REPORT   Patient Name:   MR. Levander DELENA Renella Mickey. Date of Exam: 08/12/2024 Medical Rec #:  [id number]               Height:       68.0 in Accession #:    [id number]              Weight:       145.5 lb Date of Birth:  [birth year]               BSA:          1.785 m Patient Age:    68 years                BP:           99/62 mmHg Patient Gender: M                       HR:           53 bpm. Exam Location:  [facility] Procedure: 2D Echo, Cardiac Doppler and Color Doppler (Both Spectral and Color            Flow Doppler were utilized during procedure). Indications:     Shock R57.9  History:         Patient has no prior history of Echocardiogram examinations.  Sonographer:     Thedora Louder RDCS, FASE Referring Phys:  [id number] DARRIN BARN Diagnosing Phys: Caron Poser IMPRESSIONS  1. Left ventricular ejection fraction, by estimation, is 65 to 70%. The left ventricle has normal function. Left ventricular endocardial border not optimally defined to evaluate regional wall motion. There is mild  left ventricular hypertrophy. Left ventricular diastolic parameters are indeterminate.  2. Right ventricular systolic function is normal. The right ventricular size is normal.  3. The mitral valve is degenerative. No evidence of mitral valve regurgitation. Mild calcific mitral stenosis. The mean mitral valve gradient is 3.0 mmHg. Severe mitral annular calcification with significant sub-valvular calcifications.  4. The aortic valve has an indeterminant number of cusps. There is severe calcifcation of the aortic valve. Aortic valve regurgitation is trivial. Mild-moderate aortic valve stenosis by hemodynamics, though appears worse by visual assessment. Aortic valve mean gradient measures 14.0 mmHg. Aortic valve Vmax measures 2.54 m/s. AVA 1.19. DVI 0.37 with SVI 38.  5. Aortic dilatation noted. There is  mild dilatation of the aortic root, measuring 40 mm.  6. The inferior vena cava is dilated in size with <50% respiratory variability, suggesting right atrial pressure of 15 mmHg. Comparison(s): No prior Echocardiogram. FINDINGS  Left Ventricle: Left ventricular ejection fraction, by estimation, is 65 to 70%. The left ventricle has normal function. Left ventricular endocardial border not optimally defined to evaluate regional wall motion. The left ventricular internal cavity size was normal in size. There is mild left ventricular hypertrophy. Left ventricular diastolic parameters are indeterminate. Right Ventricle: The right ventricular size is normal. Right vetricular wall thickness was not well visualized. Right ventricular systolic function is normal. Left Atrium: Left atrial size was not well visualized. Right Atrium: Right atrial size was not well visualized. Pericardium: There is no evidence of pericardial effusion. Mitral Valve: The mitral valve is degenerative in appearance. There is severe calcification of the mitral valve leaflet(s). Severe mitral annular calcification. No evidence of mitral valve regurgitation. Mild mitral valve stenosis. MV peak gradient, 9.5 mmHg. The mean mitral valve gradient is 3.0 mmHg. Tricuspid Valve: The tricuspid valve is not well visualized. Tricuspid valve regurgitation is not demonstrated. No evidence of tricuspid stenosis. Aortic Valve: The aortic valve has an indeterminant number of cusps. There is severe calcifcation of the aortic valve. Aortic valve regurgitation is trivial. Mild to moderate aortic stenosis is present. Aortic valve mean gradient measures 14.0 mmHg. Aortic valve peak gradient measures 25.8 mmHg. Aortic valve area, by VTI measures 1.32 cm. Pulmonic Valve: The pulmonic valve was not well visualized. Pulmonic valve regurgitation is not visualized. No evidence of pulmonic stenosis. Aorta: Aortic dilatation noted and the aortic root is normal in size and  structure. There is mild dilatation of the aortic root, measuring 40 mm. Venous: The inferior vena cava is dilated in size with less than 50% respiratory variability, suggesting right atrial pressure of 15 mmHg. IAS/Shunts: The interatrial septum was not well visualized.  LEFT VENTRICLE PLAX 2D LVIDd:         4.70 cm   Diastology LVIDs:         2.90 cm   LV e' medial:    7.07 cm/s LV PW:         1.10 cm   LV E/e' medial:  11.9 LV IVS:        1.10 cm   LV e' lateral:   7.29 cm/s LVOT diam:     2.00 cm   LV E/e' lateral: 11.6 LV SV:         69 LV SV Index:   39 LVOT Area:     3.14 cm  RIGHT VENTRICLE RV Basal diam:  3.40 cm TAPSE (M-mode): 2.4 cm LEFT ATRIUM             Index LA diam:  3.70 cm 2.07 cm/m LA Vol (A2C):   42.8 ml 23.97 ml/m LA Vol (A4C):   59.2 ml 33.16 ml/m LA Biplane Vol: 52.9 ml 29.63 ml/m  AORTIC VALVE                     PULMONIC VALVE AV Area (Vmax):    1.12 cm      PV Vmax:        1.40 m/s AV Area (Vmean):   1.12 cm      PV Peak grad:   7.8 mmHg AV Area (VTI):     1.32 cm      RVOT Peak grad: 2 mmHg AV Vmax:           254.00 cm/s AV Vmean:          157.667 cm/s AV VTI:            0.525 m AV Peak Grad:      25.8 mmHg AV Mean Grad:      14.0 mmHg LVOT Vmax:         90.20 cm/s LVOT Vmean:        56.200 cm/s LVOT VTI:          0.220 m LVOT/AV VTI ratio: 0.42  AORTA Ao Root diam: 4.00 cm Ao Asc diam:  3.20 cm MITRAL VALVE MV Area (PHT): 1.64 cm     SHUNTS MV Area VTI:   1.08 cm     Systemic VTI:  0.22 m MV Peak grad:  9.5 mmHg     Systemic Diam: 2.00 cm MV Mean grad:  3.0 mmHg MV Vmax:       1.54 m/s MV Vmean:      79.7 cm/s MV Decel Time: 462 msec MV E velocity: 84.25 cm/s MV A velocity: 135.00 cm/s MV E/A ratio:  0.62 Caron Poser Electronically signed by Caron Poser Signature Date/Time: 08/12/2024/3:15:31 PM    Final    CT Angio Chest Pulmonary Embolism (PE) W or WO Contrast Result Date: 08/12/2024 EXAM: CTA CHEST AORTA 08/12/2024 11:37:54 AM TECHNIQUE: CTA of the chest was  performed after the administration of 75 mL of iohexol  (OMNIPAQUE ) 350 MG/ML injection. Multiplanar reformatted images are provided for review. MIP images are provided for review. Automated exposure control, iterative reconstruction, and/or weight based adjustment of the mA/kV was utilized to reduce the radiation dose to as low as reasonably achievable. COMPARISON: Non-contrast chest CT from yesterday. CLINICAL HISTORY: Pulmonary embolism (PE) suspected, high prob. FINDINGS: AORTA: Calcified aortic atherosclerosis. No thoracic aortic dissection. No aneurysm. MEDIASTINUM: Excellent pulmonary artery contrast timing. Calcified coronary artery atherosclerosis. No pericardial effusion. The heart and pericardium demonstrate no acute abnormality. No obvious malignant mediastinal lymph node. LYMPH NODES: No mediastinal, hilar or axillary lymphadenopathy. LUNGS AND PLEURA: Masslike areas of heterogeneity and hypoenhancement in the right lung as seen on series 4 image 47 (lateral to the right hilum, 5.7 cm long axis) and series 4 image 64 (right middle lobe, 9.8 cm long axis closes ) more resemble lung tumor than consolidation. There are no associated air bronchograms. By comparison, there is confluent but enhancing atelectasis in both lower lobes including series 4 image 81, with associated air bronchograms. Additional dependent atelectasis along both major fissures. Underlying widespread emphysema. Central airways remain patent. Bilateral layering pleural effusions, small on the left and moderate on the right, have mildly progressed since yesterday. No pneumothorax. UPPER ABDOMEN: Negative visible non-contrast upper abdominal viscera. SOFT TISSUES AND BONES: Heterogeneous thoracic spinal compression  fractures at T6 (vertebral plana) and T10 (moderate compression) are stable since yesterday. These are indeterminate, no epidural tumor by CT. No new osseous abnormality. No acute soft tissue abnormality. IMPRESSION: 1.  Confulent areas of heterogeneous hypoenhancement in the right lung are more consistent with multifocal tumor than consolidation, up to 9.8 cm each. Underlying Emphysema. 2. Bilateral layering pleural effusions have mildly progressed since yesterday, with increased dependent and lower lobe atelectasis. 3. No evidence of pulmonary embolism. 4. Indeterminant T6 and T10 compression fractures are stable. Electronically signed by: Helayne Hurst MD 08/12/2024 11:59 AM EST RP Workstation: [id number]   DG Chest Port 1 View Result Date: 08/12/2024 CLINICAL DATA:  5626 Acute respiratory failure (HCC) 5626 EXAM: PORTABLE CHEST 1 VIEW COMPARISON:  August 11, 2024 FINDINGS: The cardiomediastinal silhouette is unchanged in contour. Moderate RIGHT pleural effusion, likely a loculated component. No pneumothorax. Similar appearance of the RIGHT basilar peripheral predominant rounded airspace opacity, better assessed on recent CT. Mild interstitial prominence peribronchial cuffing. IMPRESSION: 1. Moderate RIGHT pleural effusion, likely a loculated component. 2. Similar appearance of the RIGHT basilar peripheral predominant rounded airspace opacity, better assessed on recent CT. Differential considerations remain neoplasm versus infection. 3. Diffuse interstitial prominence may reflect a superimposed background of mild pulmonary edema. Electronically Signed   By: Corean Salter M.D.   On: 08/12/2024 10:22   CT Chest Wo Contrast Result Date: 08/11/2024 CLINICAL DATA:  Tachypnea and hypotension. Right lung pneumonia or mass with a probable associated pleural effusion on portable chest radiograph obtained earlier today. Clinical concern for empyema. Fifty pack-year history of smoking. EXAM: CT CHEST WITHOUT CONTRAST TECHNIQUE: Multidetector CT imaging of the chest was performed following the standard protocol without IV contrast. RADIATION DOSE REDUCTION: This exam was performed according to the departmental dose-optimization  program which includes automated exposure control, adjustment of the mA and/or kV according to patient size and/or use of iterative reconstruction technique. COMPARISON:  Chest radiographs earlier today and on 04/03/2022. Chest CT dated 10/30/2021. FINDINGS: Cardiovascular: Atheromatous calcifications, including the coronary arteries and aorta. Interval mildly enlarged heart. Small amount of pericardial fluid. Diffuse low density of the blood relative to the arterial walls. Enlarged central pulmonary arteries with a main pulmonary artery diameter of 3.9 cm. Mediastinum/Nodes: Enlarged right hilar node with a short axis diameter of 2.1 cm on image number 71/2. Enlarged right precarinal node with a short axis diameter of 1.5 cm on image number 64/2. Unremarkable thyroid  gland. Mild-to-moderate diffuse proximal esophageal wall thickening. Mildly dilated distal esophagus containing ingested material. Unremarkable trachea. Lungs/Pleura: Diffuse bilateral centrilobular emphysema with progression. Three interval mass-like densities in the right lung. The more superior area measures 3.7 x 2.2 cm on image number 54/3. The middle area measures 4.4 x 3.7 cm on image number 62/3 and the more inferior area measures 3.2 x 1.6 cm on image number 73/3. Additional dense consolidation in the periphery of the right upper lobe and periphery of the right lower lobe. Small to moderate-sized right pleural effusion extending around the periphery of the right lung. The superior aspect of the pleural fluid at the level of the right upper lobe has a more loculated appearance laterally and anteriorly. Mild-to-moderate dependent left lower lobe atelectasis. Upper Abdomen: Atheromatous arterial calcifications. The included portions of the adrenal glands have normal appearances. Musculoskeletal: Approximately 80% compression deformity of the T7 vertebral body with mild bony retropulsion and associated sclerosis. Approximately 30% compression  deformity of the T11 vertebral body with no bony retropulsion and associated sclerosis. No  acute fracture lines seen. Lower cervical spine degenerative changes. No evidence of bony metastatic disease. IMPRESSION: 1. Three interval mass-like densities in the right lung, as described above. These could represent areas of dense pneumonia or neoplasm. 2. Additional dense consolidation in the periphery of the right upper lobe and periphery of the right lower lobe, compatible with pneumonia or postobstructive changes. 3. Small to moderate-sized right pleural effusion with a more loculated appearance superiorly. This could represent a parapneumonic effusion or empyema. 4. Right hilar and mediastinal adenopathy. This could be reactive or metastatic. 5. Mild-to-moderate diffuse proximal esophageal wall thickening. This could be due to esophagitis. 6. Mildly dilated distal esophagus containing ingested material, possibly representing achalasia. 7. Interval mild cardiomegaly. 8. Diffuse low density of the blood relative to the arterial walls, compatible with anemia. 9. Enlarged central pulmonary arteries, compatible with pulmonary arterial hypertension. 10. Compression deformities of the T7 and T11 vertebral bodies with no acute fracture lines seen. These are most likely old. 11. Calcific coronary artery and aortic atherosclerosis. Aortic Atherosclerosis (ICD10-I70.0) and Emphysema (ICD10-J43.9). Electronically Signed   By: Elspeth Bathe M.D.   On: 08/11/2024 15:24   DG Chest Portable 1 View Result Date: 08/11/2024 EXAM: 1 VIEW(S) XRAY OF THE CHEST 08/11/2024 11:42:00 AM COMPARISON: 04/03/2022 CLINICAL HISTORY: 68 year old male. Tachypneic and hypotensive. Poor historian. FINDINGS: LUNGS AND PLEURA: Dense airspace opacity in right mid and lower lung zone. Probable superimposed Right pleural effusion. No pneumothorax. HEART AND MEDIASTINUM: No acute abnormality of the cardiac and mediastinal silhouettes. BONES AND SOFT  TISSUES: No acute osseous abnormality. IMPRESSION: 1. Extensive patchy and confluent nonspecific opacification of the right lower lobe. Main differential considerations are pneumonia and/or tumor, with probable associated pleural effusion. Electronically signed by: Helayne Hurst MD 08/11/2024 12:11 PM EST RP Workstation: [id number]    Microbiology Recent Results (from the past 240 hours)  Resp panel by RT-PCR (RSV, Flu A&B, Covid) Anterior Nasal Swab     Status: None   Collection Time: 08/11/24 11:29 AM   Specimen: Anterior Nasal Swab  Result Value Ref Range Status   SARS Coronavirus 2 by RT PCR NEGATIVE NEGATIVE Final    Comment: (NOTE) SARS-CoV-2 target nucleic acids are NOT DETECTED.  The SARS-CoV-2 RNA is generally detectable in upper respiratory specimens during the acute phase of infection. The lowest concentration of SARS-CoV-2 viral copies this assay can detect is 138 copies/mL. A negative result does not preclude SARS-Cov-2 infection and should not be used as the sole basis for treatment or other patient management decisions. A negative result may occur with  improper specimen collection/handling, submission of specimen other than nasopharyngeal swab, presence of viral mutation(s) within the areas targeted by this assay, and inadequate number of viral copies(<138 copies/mL). A negative result must be combined with clinical observations, patient history, and epidemiological information. The expected result is Negative.  Fact Sheet for Patients:  bloggercourse.com  Fact Sheet for Healthcare Providers:  seriousbroker.it  This test is no t yet approved or cleared by the [place]  FDA and  has been authorized for detection and/or diagnosis of SARS-CoV-2 by FDA under an Emergency Use Authorization (EUA). This EUA will remain  in effect (meaning this test can be used) for the duration of the COVID-19 declaration under Section  564(b)(1) of the Act, 21 U.S.C.section 360bbb-3(b)(1), unless the authorization is terminated  or revoked sooner.       Influenza A by PCR NEGATIVE NEGATIVE Final   Influenza B by PCR NEGATIVE NEGATIVE Final  Comment: (NOTE) The Xpert Xpress SARS-CoV-2/FLU/RSV plus assay is intended as an aid in the diagnosis of influenza from Nasopharyngeal swab specimens and should not be used as a sole basis for treatment. Nasal washings and aspirates are unacceptable for Xpert Xpress SARS-CoV-2/FLU/RSV testing.  Fact Sheet for Patients: bloggercourse.com  Fact Sheet for Healthcare Providers: seriousbroker.it  This test is not yet approved or cleared by the [place]  FDA and has been authorized for detection and/or diagnosis of SARS-CoV-2 by FDA under an Emergency Use Authorization (EUA). This EUA will remain in effect (meaning this test can be used) for the duration of the COVID-19 declaration under Section 564(b)(1) of the Act, 21 U.S.C. section 360bbb-3(b)(1), unless the authorization is terminated or revoked.     Resp Syncytial Virus by PCR NEGATIVE NEGATIVE Final    Comment: (NOTE) Fact Sheet for Patients: bloggercourse.com  Fact Sheet for Healthcare Providers: seriousbroker.it  This test is not yet approved or cleared by the [place]  FDA and has been authorized for detection and/or diagnosis of SARS-CoV-2 by FDA under an Emergency Use Authorization (EUA). This EUA will remain in effect (meaning this test can be used) for the duration of the COVID-19 declaration under Section 564(b)(1) of the Act, 21 U.S.C. section 360bbb-3(b)(1), unless the authorization is terminated or revoked.  Performed at [facility], [address] [place], [place] [address]   Blood culture (routine x 2)     Status: None   Collection Time: 08/11/24 12:05 PM   Specimen: BLOOD   Result Value Ref Range Status   Specimen Description BLOOD BLOOD LEFT ARM  Final   Special Requests   Final    BOTTLES DRAWN AEROBIC AND ANAEROBIC Blood Culture results may not be optimal due to an inadequate volume of blood received in culture bottles   Culture   Final    NO GROWTH 5 DAYS Performed at [facility], [address] [place], [place] [address]    Report Status 2024-08-28 FINAL  Final  Blood culture (routine x 2)     Status: None   Collection Time: 08/11/24 12:05 PM   Specimen: BLOOD  Result Value Ref Range Status   Specimen Description BLOOD BLOOD RIGHT ARM  Final   Special Requests   Final    BOTTLES DRAWN AEROBIC AND ANAEROBIC Blood Culture results may not be optimal due to an inadequate volume of blood received in culture bottles   Culture   Final    NO GROWTH 5 DAYS Performed at [facility], [address] [place], [place] [address]    Report Status August 28, 2024 FINAL  Final  MRSA Next Gen by PCR, Nasal     Status: None   Collection Time: 08/11/24  4:06 PM   Specimen: Nasal Mucosa; Nasal Swab  Result Value Ref Range Status   MRSA by PCR Next Gen NOT DETECTED NOT DETECTED Final    Comment: (NOTE) The GeneXpert MRSA Assay (FDA approved for NASAL specimens only), is one component of a comprehensive MRSA colonization surveillance program. It is not intended to diagnose MRSA infection nor to guide or monitor treatment for MRSA infections. Test performance is not FDA approved in patients less than 6 years old. Performed at [facility], [address] [place], [place] [address]   Urine Culture     Status: None   Collection Time: 08/12/24  1:13 AM   Specimen: Urine, Clean Catch  Result Value Ref Range Status   Specimen Description   Final  URINE, CLEAN CATCH Performed at [facility], [address] [place], [place] [address]    Special Requests   Final    NONE Performed at [facility], [address], [place], [place] [address]    Culture   Final    NO GROWTH Performed at [facility], [address], [place], [place] [address]    Report Status 08/13/2024 FINAL  Final  Culture, Respiratory w Gram Stain     Status: None (Preliminary result)   Collection Time: 08/13/24  3:20 PM   Specimen: Bronchoalveolar Lavage; Respiratory  Result Value Ref Range Status   Specimen Description   Final    BRONCHIAL ALVEOLAR LAVAGE Performed at [facility], [address] [place], [place] [address]    Special Requests   Final    NONE Performed at [facility], [address] [place], [place] [address]    Gram Stain NO WBC SEEN RARE GRAM POSITIVE COCCI   Final   Culture   Final    FEW STREPTOCOCCUS INTERMEDIUS SUSCEPTIBILITIES TO FOLLOW Performed at [facility], [address], [place], [place] [address]    Report Status PENDING  Incomplete    Lab Basic Metabolic Panel: Recent Labs  Lab 08/12/24 0341 08/12/24 1559 08/12/24 1732 08/13/24 0445 08/14/24 0417 08/15/24 0401 2024/09/11 0406  NA 130*  --   --  128* 128* 131* 134*  K 3.0*   < > 3.4* 3.5 3.6 3.9 4.2  CL 90*  --   --  91* 89* 93* 96*  CO2 27  --   --  26 26 27 30   GLUCOSE 155*  --   --  129* 199* 146* 178*  BUN 49*  --   --  46* 46* 53* 54*  CREATININE 1.27*  --   --  1.16 1.42* 1.45* 1.01  CALCIUM  8.3*  --   --  8.6* 8.4* 8.2* 8.5*  MG 2.2  --   --  2.3 2.1 2.1 2.1  PHOS 3.8  --   --  2.9 4.3 3.6 2.3*   < > = values in this interval not displayed.   Liver Function Tests: Recent Labs  Lab 08/11/24 1129 08/12/24 0341 08/13/24 0445 08/15/24 0401 Sep 11, 2024 0406  AST 24  --  17  --   --   ALT 7  --  5  --   --   ALKPHOS 73  --  63  --   --   BILITOT 0.9  --  0.6  --   --   PROT 6.4*  --  5.5*  --   --   ALBUMIN  2.7* 2.1* 2.5* 2.4* 2.7*   Recent Labs  Lab 08/11/24 1129  LIPASE 16   No results for input(s): AMMONIA in the last 168 hours. CBC: Recent Labs  Lab 08/11/24 1129  08/12/24 0341 08/13/24 0445 08/14/24 0417 08/15/24 0401 08/15/24 1541 08/15/24 2153 11-Sep-2024 0406  WBC 16.8*   < > 12.9* 17.6* 14.5* 14.2*  --  17.3*  NEUTROABS 14.6*  --  11.6* 15.1*  --   --   --   --   HGB 10.1*   < > 8.1* 9.1* 7.5* 7.1* 7.1* 7.1*  HCT 28.0*   < > 23.3* 26.2* 22.0* 21.0* 21.3* 21.7*  MCV 92.4   < > 94.0 94.6 96.1 97.7  --  98.6  PLT 114*   < > 61* 82* 63* 61*  --  75*   < > =  values in this interval not displayed.   Cardiac Enzymes: No results for input(s): CKTOTAL, CKMB, CKMBINDEX, TROPONINI in the last 168 hours. Sepsis Labs: Recent Labs  Lab 08/11/24 1205 08/11/24 1330 08/11/24 1653 08/12/24 0341 08/12/24 1559 08/13/24 0445 08/14/24 0202 08/14/24 0417 08/15/24 0401 08/15/24 1541 2024-09-12 0406  PROCALCITON  --  1.10  --   --   --   --   --   --   --   --   --   WBC  --   --   --    < >  --    < >  --  17.6* 14.5* 14.2* 17.3*  LATICACIDVEN 2.5*  --  1.5  --  0.9  --  1.7  --   --   --   --    < > = values in this interval not displayed.     Nickolas Cellar Sep 12, 2024, 6:31 PM

## 2024-08-21 NOTE — Progress Notes (Signed)
 PHARMACY CONSULT NOTE  Pharmacy Consult for Electrolyte Monitoring and Replacement   Recent Labs: Potassium (mmol/L)  Date Value  08/23/2024 4.2   Magnesium  (mg/dL)  Date Value  88/73/7974 2.1   Calcium  (mg/dL)  Date Value  88/73/7974 8.5 (L)   Albumin  (g/dL)  Date Value  88/73/7974 2.7 (L)  11/30/2023 3.8 (L)   Phosphorus (mg/dL)  Date Value  88/73/7974 2.3 (L)   Sodium (mmol/L)  Date Value  23-Aug-2024 134 (L)  11/30/2023 132 (L)   Assessment: 68 y.o. male w/ PMH of bipolar d/o, HTN, GERD, dysphagia, h/o intestinal disaccharide deficiency  who presents to the ED for evaluation of an abnormal lab and hypotension. Pharmacy is asked to follow and replace electrolytes while in CCU   Goal of Therapy:  Electrolytes WNL  Plan:  --Kphos 500mg  via tube x 2 doses --Re-check electrolytes tomorrow AM  Eric Hatfield Percy Aug 23, 2024 7:37 AM

## 2024-08-21 NOTE — Progress Notes (Signed)
 Daily Progress Note   Patient Name: Eric Hatfield.       Date: Aug 24, 2024 DOB: [birth year]  Age: 68 y.o. MRN#: [id number] Attending Physician: Isaiah Scrivener, MD Primary Care Physician: Gasper Nancyann BRAVO, MD Admit Date: 08/11/2024  Reason for Consultation/Follow-up: Establishing goals of care  Patient Profile/HPI:  68 y.o. male  with past medical history of bipolar disorder and current cigarette smoker admitted from home on 08/11/2024 with generalized weakness.  Reportedly from family Mr. Thurow experiencing symptoms of GERD and dysphagia for several days.  Mr. Borkenhagen developed shortness of breath and weakness which led to ED evaluation.   Found to be in shock likely secondary to community-acquired pneumonia, admitted to ICU for vasopressor support.   CT chest obtained revealing extensive right lower lobe and right middle lobe consolidations, small pleural effusion, thickened esophagus and masslike consolidation in the right middle lobe   Intubated on 11/23.   Palliative medicine was consulted for assisting with goals of care conversations.  Subjective: Chart reviewed including labs, progress notes, imaging from this and previous encounters.  Met in conference with patient's spouse, both parents, brother, granddaughters, and niece along with Dr. Isaiah.  Dr. Isaiah explained patient's terminal state with large tumor burden.  Understanding patient's wishes not to have life prolonged artificially in a terminal state we discussed option for extubation to comfort measures only.  Extensive amount of time was spent discussing patient's poor prognosis and ensuring that patient's spouse understood the difficult situation.  Comfort measures reviewed. Discussed transition to comfort measures only  which includes stopping IV fluids, antibiotics, labs and providing symptom management for SOB, anxiety, nausea, vomiting, and other symptoms of dying.  Discussed procedure for extubation and making efforts to ensure patient's comfort.  At close of discussion all agreed that extubation to comfort is best plan of care for patient.   Review of Systems  Unable to perform ROS: Mental status change     Physical Exam Vitals and nursing note reviewed.  Constitutional:      Comments: cachetic  Cardiovascular:     Rate and Rhythm: Normal rate.  Pulmonary:     Comments: intubated            Vital Signs: BP 119/71   Pulse 74   Temp 97.9 F (36.6 C)   Resp 17  Ht 5' 8 (1.727 m)   Wt 67.9 kg   SpO2 93%   BMI 22.76 kg/m  SpO2: SpO2: 93 % O2 Device: O2 Device: Ventilator O2 Flow Rate: O2 Flow Rate (L/min): 55 L/min  Intake/output summary:  Intake/Output Summary (Last 24 hours) at 08-31-24 1603 Last data filed at Aug 31, 2024 1534 Gross per 24 hour  Intake 2681.87 ml  Output 1295 ml  Net 1386.87 ml   LBM: Last BM Date : August 31, 2024 Baseline Weight: Weight: 62.7 kg Most recent weight: Weight: 67.9 kg       Palliative Assessment/Data: PPS: 10%      Patient Active Problem List   Diagnosis Date Noted   Acute respiratory failure with hypoxia (HCC) 08/13/2024   Septic shock (HCC) 08/13/2024   Community acquired pneumonia 08/11/2024   Bipolar disorder, curr episode mixed, severe, with psychotic features (HCC) 04/24/2022   Bipolar affective disorder (HCC) 04/23/2022   Systolic murmur 06/30/2019   SIADH (syndrome of inappropriate ADH production) 06/28/2019   Primary hypertension 12/11/2016   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 09/02/2015   Hyponatremia 09/02/2015   Allergic rhinitis 09/02/2015   Smoking greater than 30 pack years 09/02/2015    Palliative Care Assessment & Plan    Assessment/Recommendations/Plan  Severe sepsis related to  postobstructive pnuemonia in setting of new finding of large likely malignant lung tumor- plan to transition to full comfort measures only Extubation orders placed Recommend continuing propofol  for comfort during extubation- titrate up for comfort  Code Status:   Code Status: Do not attempt resuscitation (DNR) - Comfort care   Prognosis:  Unable to determine  Discharge Planning: To Be Determined  Care plan was discussed with patient's family and attending MD.   Thank you for allowing the Palliative Medicine Team to assist in the care of this patient.  Total time:  90 minutes Prolonged billing:  Time includes:   Preparing to see the patient (e.g., review of tests) Obtaining and/or reviewing separately obtained history Performing a medically necessary appropriate examination and/or evaluation Counseling and educating the patient/family/caregiver Ordering medications, tests, or procedures Referring and communicating with other health care professionals (when not reported separately) Documenting clinical information in the electronic or other health record Independently interpreting results (not reported separately) and communicating results to the patient/family/caregiver Care coordination (not reported separately) Clinical documentation  Cassondra Stain, AGNP-C Palliative Medicine   Please contact Palliative Medicine Team phone at [phone] for questions and concerns.

## 2024-08-21 NOTE — TOC Initial Note (Signed)
 Transition of Care (TOC) - Initial/Assessment Note    Patient Details  Name: Eric Hatfield. MRN: [id number] Date of Birth: [birth year]  Transition of Care [facility]) CM/SW Contact:    Corrie JINNY Ruts, LCSW Phone Number: 09-04-24, 11:32 AM  Clinical Narrative:                 Chart reviewed. The patient is currently intubated. I was able to speak with the patient wife via phone. I introduced myself, my role, and reason for consult.   The patient wife reports that the patient has a PCP. The patient wife reports that she is the only one who lives in the home with the patient. The patient wife reports that the patient was able to complete task independently before being admitted into the hospital. The patient wife reports that the patient drives himself to medical appointments. The patient wife reports that family will assist during D/C.   The patient [facility] The patient wife reports that the patient has never had HH or been admitted in a SNF in the past. The patient wife reports that the patient does not have any equipment in the home.   The patient wife had no questions or concerns during the assessment.  ICM will follow the patient until D/C.         Patient Goals and CMS Choice            Expected Discharge Plan and Services                                              Prior Living Arrangements/Services                       Activities of Daily Living      Permission Sought/Granted                  Emotional Assessment              Admission diagnosis:  Community acquired pneumonia [J18.9] Septic shock (HCC) [A41.9, R65.21] Community acquired pneumonia of right lower lobe of lung [J18.9] Patient Active Problem List   Diagnosis Date Noted   Acute respiratory failure with hypoxia (HCC) 08/13/2024   Septic shock (HCC) 08/13/2024   Community acquired pneumonia 08/11/2024   Bipolar disorder,  curr episode mixed, severe, with psychotic features (HCC) 04/24/2022   Bipolar affective disorder (HCC) 04/23/2022   Systolic murmur 06/30/2019   SIADH (syndrome of inappropriate ADH production) 06/28/2019   Primary hypertension 12/11/2016   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, [place] - [address] [place] [address] Phone: [phone] Fax: [phone]     Social Drivers of Health (SDOH) Social History: SDOH Screenings   Food Insecurity: Patient Unable To Answer (08/14/2024)  Housing: Unknown (08/14/2024)  Transportation Needs: Patient Unable To Answer (08/14/2024)  Utilities: Patient Unable To Answer (08/14/2024)  Alcohol Screen: Low Risk  (01/05/2024)  Depression (PHQ2-9): Low Risk  (06/19/2024)  Financial Resource Strain: Low Risk  (01/05/2024)  Physical Activity: Insufficiently Active (01/05/2024)  Social Connections: Patient Unable To Answer (08/14/2024)  Stress: No Stress Concern Present (01/05/2024)  Tobacco Use: High Risk (06/19/2024)  Health Literacy: Adequate Health Literacy (01/05/2024)   SDOH Interventions:     Readmission Risk Interventions    09/11/2024   11:32 AM  Readmission Risk Prevention Plan  Transportation Screening Complete  PCP or Specialist Appt within 3-5 Days Complete  HRI or Home Care Consult Complete  Social Work Consult for Recovery Care Planning/Counseling Complete  Palliative Care Screening Not Applicable  Medication Review Oceanographer) Complete

## 2024-08-21 NOTE — Progress Notes (Signed)
 PHARMACY - ANTICOAGULATION CONSULT NOTE  Pharmacy Consult for Heparin  Infusion Indication: atrial fibrillation- no bolus, low goal given low PLTs  No Known Allergies  Patient Measurements: Height: 5' 8 (172.7 cm) Weight: 67.9 kg (149 lb 11.1 oz) IBW/kg (Calculated) : 68.4 HEPARIN  DW (KG): 61.2  Vital Signs: Temp: 98.2 F (36.8 C) 09-15-24 0400) Temp Source: Axillary 09-15-24 0400) BP: 106/65 15-Sep-2024 0600) Pulse Rate: 77 September 15, 2024 0600)  Labs: Recent Labs    08/13/24 1104 08/14/24 0036 08/14/24 0417 08/14/24 1111 08/14/24 1912 08/15/24 0401 08/15/24 1541 08/15/24 2153 09/15/2024 0406  HGB  --    < > 9.1*  --   --  7.5* 7.1* 7.1* 7.1*  HCT  --    < > 26.2*  --   --  22.0* 21.0* 21.3* 21.7*  PLT  --    < > 82*  --   --  63* 61*  --  75*  APTT 34  --   --   --   --   --   --   --   --   LABPROT 15.3*  --   --   --   --   --   --   --   --   INR 1.1  --   --   --   --   --   --   --   --   HEPARINUNFRC  --    < >  --    < > 0.34 0.33  --   --  0.22*  CREATININE  --   --  1.42*  --   --  1.45*  --   --  1.01   < > = values in this interval not displayed.    Estimated Creatinine Clearance: 68.2 mL/min (by C-G formula based on SCr of 1.01 mg/dL).   Medical History: Past Medical History:  Diagnosis Date   Bipolar disorder (HCC)    Hypertension     Medications:  SQH for DVT ppx- last dose 11/23 @ 0511  Assessment: Patient is a 68 year old male with a past medical history of bipolar disorder, smoking history and recent history of dysphagia and GERD and choking on food who presented with septic shock likely 2/2 pneumonia vs post-obstructive pneumonia. Pharmacy has been consulted to initiate patient on a heparin  infusion for Afib.   Baseline INR 1.1 and aPTT 34. Hgb 8.1. PLT 61  Goal of Therapy:  Heparin  level 0.3-0.5 units/ml Monitor platelets by anticoagulation protocol: Yes   Plan:  Per discussion with PA, will do no bolus, low goal given low PLTs  11/24:  HL @ 1111  = 0.38, therapeutic x1 11/24:  HL @ 1912 = 0.34, therapeutic x2 11/25 0401 HL 0.33, therapeutic x 3 Sep 15, 2024 0406 HL 0.22, subtherapeutic   - Increase heparin  infusion to 1000 units/hr - Recheck HL in 6 hrs after rate change  - Monitor CBC daily - watch PLTS and signs/symptoms of bleeding closely   Rankin CANDIE Dills, PharmD, [facility] 09/15/2024 6:32 AM

## 2024-08-21 DEATH — deceased

## 2024-09-11 ENCOUNTER — Ambulatory Visit: Admitting: Family Medicine

## 2024-11-29 ENCOUNTER — Ambulatory Visit: Admitting: Family Medicine

## 2025-01-10 ENCOUNTER — Ambulatory Visit
